# Patient Record
Sex: Female | Born: 1950 | Race: White | Hispanic: No | Smoking: Never smoker
Health system: Southern US, Community
[De-identification: ages and names within clinical notes are randomized; demographics above are authoritative.]

## PROBLEM LIST (undated history)

## (undated) DIAGNOSIS — D259 Leiomyoma of uterus, unspecified: Secondary | ICD-10-CM

## (undated) DIAGNOSIS — H269 Unspecified cataract: Secondary | ICD-10-CM

## (undated) DIAGNOSIS — M810 Age-related osteoporosis without current pathological fracture: Secondary | ICD-10-CM

## (undated) DIAGNOSIS — L570 Actinic keratosis: Secondary | ICD-10-CM

## (undated) DIAGNOSIS — F3289 Other specified depressive episodes: Secondary | ICD-10-CM

## (undated) DIAGNOSIS — J309 Allergic rhinitis, unspecified: Secondary | ICD-10-CM

## (undated) DIAGNOSIS — F419 Anxiety disorder, unspecified: Secondary | ICD-10-CM

## (undated) DIAGNOSIS — C49A0 Gastrointestinal stromal tumor, unspecified site: Secondary | ICD-10-CM

## (undated) DIAGNOSIS — D649 Anemia, unspecified: Secondary | ICD-10-CM

## (undated) DIAGNOSIS — G709 Myoneural disorder, unspecified: Secondary | ICD-10-CM

## (undated) DIAGNOSIS — E785 Hyperlipidemia, unspecified: Secondary | ICD-10-CM

## (undated) DIAGNOSIS — F329 Major depressive disorder, single episode, unspecified: Secondary | ICD-10-CM

## (undated) DIAGNOSIS — D869 Sarcoidosis, unspecified: Secondary | ICD-10-CM

## (undated) DIAGNOSIS — H3530 Unspecified macular degeneration: Secondary | ICD-10-CM

## (undated) HISTORY — DX: Myoneural disorder, unspecified: G70.9

## (undated) HISTORY — DX: Allergic rhinitis, unspecified: J30.9

## (undated) HISTORY — DX: Sarcoidosis, unspecified: D86.9

## (undated) HISTORY — DX: Other specified depressive episodes: F32.89

## (undated) HISTORY — DX: Unspecified cataract: H26.9

## (undated) HISTORY — PX: BREAST SURGERY: SHX581

## (undated) HISTORY — DX: Leiomyoma of uterus, unspecified: D25.9

## (undated) HISTORY — DX: Anemia, unspecified: D64.9

## (undated) HISTORY — PX: TUBAL LIGATION: SHX77

## (undated) HISTORY — DX: Major depressive disorder, single episode, unspecified: F32.9

## (undated) HISTORY — PX: UPPER GASTROINTESTINAL ENDOSCOPY: SHX188

## (undated) HISTORY — DX: Age-related osteoporosis without current pathological fracture: M81.0

## (undated) HISTORY — DX: Gastrointestinal stromal tumor, unspecified site: C49.A0

## (undated) HISTORY — DX: Anxiety disorder, unspecified: F41.9

## (undated) HISTORY — DX: Actinic keratosis: L57.0

## (undated) HISTORY — DX: Unspecified macular degeneration: H35.30

## (undated) HISTORY — DX: Hyperlipidemia, unspecified: E78.5

---
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
## 1975-11-02 HISTORY — PX: OTHER SURGICAL HISTORY: SHX169

## 2000-01-14 ENCOUNTER — Encounter: Payer: Self-pay | Admitting: Family Medicine

## 2000-01-14 ENCOUNTER — Ambulatory Visit (HOSPITAL_COMMUNITY): Admission: RE | Admit: 2000-01-14 | Discharge: 2000-01-14 | Payer: Self-pay | Admitting: Family Medicine

## 2000-11-01 DIAGNOSIS — M810 Age-related osteoporosis without current pathological fracture: Secondary | ICD-10-CM

## 2000-11-01 HISTORY — DX: Age-related osteoporosis without current pathological fracture: M81.0

## 2001-07-27 ENCOUNTER — Encounter: Payer: Self-pay | Admitting: Family Medicine

## 2001-07-27 ENCOUNTER — Encounter: Admission: RE | Admit: 2001-07-27 | Discharge: 2001-07-27 | Payer: Self-pay | Admitting: Family Medicine

## 2002-02-23 ENCOUNTER — Encounter: Payer: Self-pay | Admitting: Family Medicine

## 2002-02-23 ENCOUNTER — Ambulatory Visit (HOSPITAL_COMMUNITY): Admission: RE | Admit: 2002-02-23 | Discharge: 2002-02-23 | Payer: Self-pay | Admitting: Family Medicine

## 2002-03-01 ENCOUNTER — Encounter: Admission: RE | Admit: 2002-03-01 | Discharge: 2002-03-01 | Payer: Self-pay | Admitting: Family Medicine

## 2002-03-01 ENCOUNTER — Encounter: Payer: Self-pay | Admitting: Family Medicine

## 2003-04-22 ENCOUNTER — Ambulatory Visit (HOSPITAL_COMMUNITY): Admission: RE | Admit: 2003-04-22 | Discharge: 2003-04-22 | Payer: Self-pay | Admitting: Gastroenterology

## 2003-04-22 ENCOUNTER — Encounter (INDEPENDENT_AMBULATORY_CARE_PROVIDER_SITE_OTHER): Payer: Self-pay | Admitting: *Deleted

## 2004-05-06 ENCOUNTER — Other Ambulatory Visit: Admission: RE | Admit: 2004-05-06 | Discharge: 2004-05-06 | Payer: Self-pay | Admitting: Obstetrics and Gynecology

## 2005-08-13 ENCOUNTER — Ambulatory Visit (HOSPITAL_COMMUNITY): Admission: RE | Admit: 2005-08-13 | Discharge: 2005-08-13 | Payer: Self-pay | Admitting: Specialist

## 2005-08-26 ENCOUNTER — Other Ambulatory Visit: Admission: RE | Admit: 2005-08-26 | Discharge: 2005-08-26 | Payer: Self-pay | Admitting: Family Medicine

## 2007-04-10 ENCOUNTER — Encounter: Payer: Self-pay | Admitting: Internal Medicine

## 2007-04-10 LAB — CONVERTED CEMR LAB
HDL: 51 mg/dL
LDL (calc): 23 mg/dL
LDL Cholesterol: 100 mg/dL
Triglyceride fasting, serum: 113 mg/dL

## 2007-10-02 ENCOUNTER — Other Ambulatory Visit: Admission: RE | Admit: 2007-10-02 | Discharge: 2007-10-02 | Payer: Self-pay | Admitting: Family Medicine

## 2007-10-02 ENCOUNTER — Encounter: Payer: Self-pay | Admitting: Internal Medicine

## 2007-10-02 LAB — CONVERTED CEMR LAB: Pap Smear: NEGATIVE

## 2007-10-03 ENCOUNTER — Emergency Department (HOSPITAL_COMMUNITY): Admission: EM | Admit: 2007-10-03 | Discharge: 2007-10-03 | Payer: Self-pay | Admitting: Family Medicine

## 2007-11-02 DIAGNOSIS — C49A0 Gastrointestinal stromal tumor, unspecified site: Secondary | ICD-10-CM

## 2007-11-02 DIAGNOSIS — D869 Sarcoidosis, unspecified: Secondary | ICD-10-CM

## 2007-11-02 HISTORY — DX: Gastrointestinal stromal tumor, unspecified site: C49.A0

## 2007-11-02 HISTORY — DX: Sarcoidosis, unspecified: D86.9

## 2007-12-22 ENCOUNTER — Encounter: Payer: Self-pay | Admitting: Internal Medicine

## 2008-09-17 ENCOUNTER — Encounter: Payer: Self-pay | Admitting: Internal Medicine

## 2008-09-17 LAB — CONVERTED CEMR LAB
ALT: 18 units/L
AST: 25 units/L
CO2: 29 meq/L
Calcium: 9.8 mg/dL
Glucose, Bld: 91 mg/dL
HCT: 30.4 %
Hemoglobin: 10.4 g/dL
LDL Cholesterol: 114 mg/dL
MCV: 89.4 fL
Potassium: 4.4 meq/L
WBC: 4.6 10*3/uL

## 2008-09-18 ENCOUNTER — Encounter: Payer: Self-pay | Admitting: Gastroenterology

## 2008-09-19 ENCOUNTER — Encounter: Payer: Self-pay | Admitting: Gastroenterology

## 2008-10-01 ENCOUNTER — Telehealth: Payer: Self-pay | Admitting: Gastroenterology

## 2008-10-01 ENCOUNTER — Encounter: Payer: Self-pay | Admitting: Gastroenterology

## 2008-10-03 ENCOUNTER — Ambulatory Visit (HOSPITAL_COMMUNITY): Admission: RE | Admit: 2008-10-03 | Discharge: 2008-10-03 | Payer: Self-pay | Admitting: Gastroenterology

## 2008-10-03 ENCOUNTER — Ambulatory Visit: Payer: Self-pay | Admitting: Gastroenterology

## 2008-10-03 ENCOUNTER — Encounter: Payer: Self-pay | Admitting: Gastroenterology

## 2008-10-04 ENCOUNTER — Ambulatory Visit: Payer: Self-pay | Admitting: Internal Medicine

## 2008-10-04 ENCOUNTER — Ambulatory Visit: Payer: Self-pay | Admitting: Gastroenterology

## 2008-10-08 ENCOUNTER — Encounter: Payer: Self-pay | Admitting: Gastroenterology

## 2008-10-08 ENCOUNTER — Telehealth: Payer: Self-pay | Admitting: Gastroenterology

## 2008-10-09 ENCOUNTER — Encounter: Payer: Self-pay | Admitting: Gastroenterology

## 2008-10-14 ENCOUNTER — Encounter: Payer: Self-pay | Admitting: Gastroenterology

## 2008-10-16 ENCOUNTER — Ambulatory Visit: Payer: Self-pay | Admitting: Pulmonary Disease

## 2008-10-16 ENCOUNTER — Telehealth: Payer: Self-pay | Admitting: Pulmonary Disease

## 2008-10-16 DIAGNOSIS — E785 Hyperlipidemia, unspecified: Secondary | ICD-10-CM | POA: Insufficient documentation

## 2008-10-16 DIAGNOSIS — R599 Enlarged lymph nodes, unspecified: Secondary | ICD-10-CM | POA: Insufficient documentation

## 2008-10-17 ENCOUNTER — Encounter: Payer: Self-pay | Admitting: Gastroenterology

## 2008-10-17 ENCOUNTER — Telehealth: Payer: Self-pay | Admitting: Gastroenterology

## 2008-10-18 ENCOUNTER — Ambulatory Visit (HOSPITAL_COMMUNITY): Admission: RE | Admit: 2008-10-18 | Discharge: 2008-10-18 | Payer: Self-pay | Admitting: Gastroenterology

## 2008-10-18 ENCOUNTER — Encounter: Payer: Self-pay | Admitting: Gastroenterology

## 2008-10-22 ENCOUNTER — Telehealth: Payer: Self-pay | Admitting: Gastroenterology

## 2008-10-28 ENCOUNTER — Telehealth (INDEPENDENT_AMBULATORY_CARE_PROVIDER_SITE_OTHER): Payer: Self-pay | Admitting: *Deleted

## 2008-10-28 ENCOUNTER — Ambulatory Visit: Payer: Self-pay | Admitting: Pulmonary Disease

## 2008-11-01 HISTORY — PX: STOMACH SURGERY: SHX791

## 2008-11-28 ENCOUNTER — Encounter: Payer: Self-pay | Admitting: Internal Medicine

## 2008-11-28 ENCOUNTER — Encounter (INDEPENDENT_AMBULATORY_CARE_PROVIDER_SITE_OTHER): Payer: Self-pay | Admitting: General Surgery

## 2008-11-28 ENCOUNTER — Inpatient Hospital Stay (HOSPITAL_COMMUNITY): Admission: RE | Admit: 2008-11-28 | Discharge: 2008-12-03 | Payer: Self-pay | Admitting: General Surgery

## 2008-12-09 ENCOUNTER — Ambulatory Visit: Payer: Self-pay | Admitting: Oncology

## 2008-12-09 ENCOUNTER — Encounter: Payer: Self-pay | Admitting: Gastroenterology

## 2008-12-17 ENCOUNTER — Encounter: Payer: Self-pay | Admitting: Gastroenterology

## 2009-01-13 ENCOUNTER — Encounter: Payer: Self-pay | Admitting: Internal Medicine

## 2009-04-23 ENCOUNTER — Encounter: Payer: Self-pay | Admitting: Pulmonary Disease

## 2009-04-25 ENCOUNTER — Ambulatory Visit: Payer: Self-pay | Admitting: Pulmonary Disease

## 2009-04-25 LAB — CONVERTED CEMR LAB
CO2: 32 meq/L (ref 19–32)
Calcium: 9.7 mg/dL (ref 8.4–10.5)
Creatinine, Ser: 0.7 mg/dL (ref 0.4–1.2)

## 2009-04-29 ENCOUNTER — Ambulatory Visit: Payer: Self-pay | Admitting: Internal Medicine

## 2009-06-30 ENCOUNTER — Encounter: Payer: Self-pay | Admitting: Gastroenterology

## 2009-07-29 ENCOUNTER — Emergency Department (HOSPITAL_COMMUNITY): Admission: EM | Admit: 2009-07-29 | Discharge: 2009-07-29 | Payer: Self-pay | Admitting: Family Medicine

## 2009-09-10 ENCOUNTER — Ambulatory Visit: Payer: Self-pay | Admitting: Internal Medicine

## 2009-09-10 DIAGNOSIS — J309 Allergic rhinitis, unspecified: Secondary | ICD-10-CM | POA: Insufficient documentation

## 2009-09-10 DIAGNOSIS — F329 Major depressive disorder, single episode, unspecified: Secondary | ICD-10-CM

## 2009-09-10 DIAGNOSIS — D649 Anemia, unspecified: Secondary | ICD-10-CM

## 2009-09-10 DIAGNOSIS — D259 Leiomyoma of uterus, unspecified: Secondary | ICD-10-CM

## 2009-09-22 ENCOUNTER — Encounter: Payer: Self-pay | Admitting: Internal Medicine

## 2009-09-22 DIAGNOSIS — M899 Disorder of bone, unspecified: Secondary | ICD-10-CM | POA: Insufficient documentation

## 2009-09-22 DIAGNOSIS — M810 Age-related osteoporosis without current pathological fracture: Secondary | ICD-10-CM

## 2009-09-22 DIAGNOSIS — M949 Disorder of cartilage, unspecified: Secondary | ICD-10-CM

## 2009-10-11 IMAGING — CT CT CHEST W/ CM
2 of 5 series · 15 of 36 positions shown, 18 images · IV contrast (agent unspecified)
Comparison: None.

CT CHEST

CLINICAL DATA: Gastric mass.  Mediastinal adenopathy.

CT CHEST AND ABDOMEN WITH CONTRAST
TECHNIQUE: Multidetector CT imaging of the chest and abdomen was
performed following the standard protocol during bolus
administration of intravenous contrast.
Contrast: 125 ml Smnipaque-ZII

[Series 2: cap 5.0 b40f st · axial · 0.66mm/px · z∈[-448,-74]mm · 12 of 89 slices shown, 15 images]
[im 7/89  mediastinal]
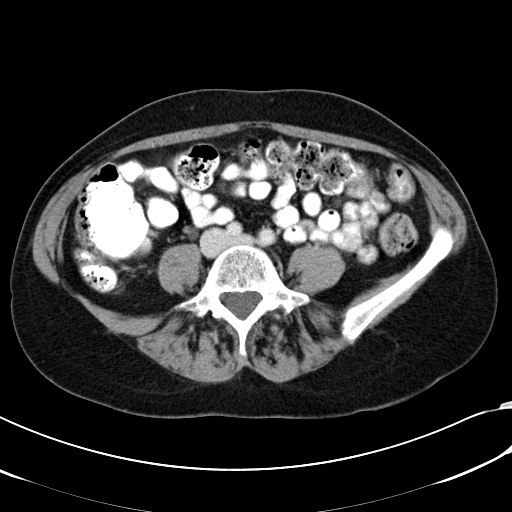
[im 7/89  lung]
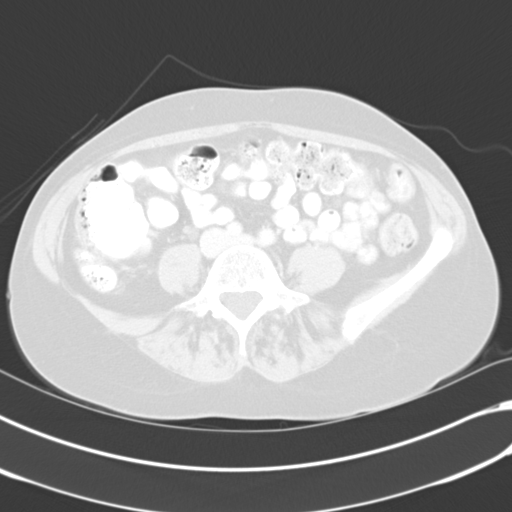
[im 14/89  lung]
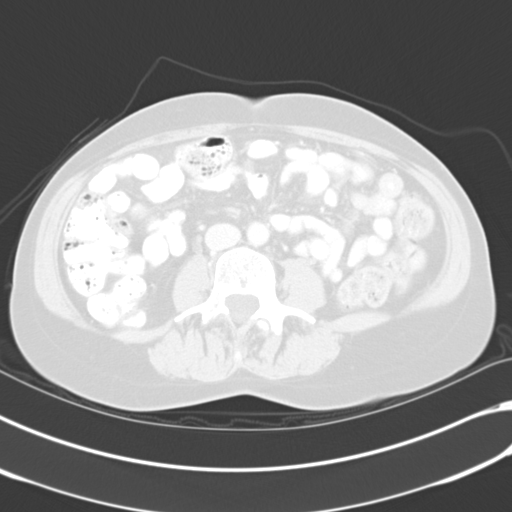
[im 21/89  lung]
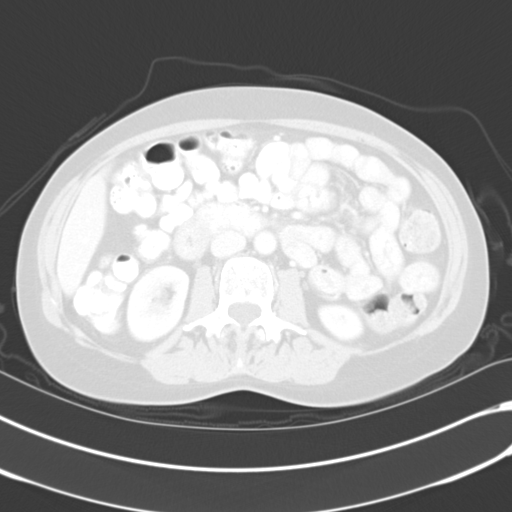
[im 28/89  lung]
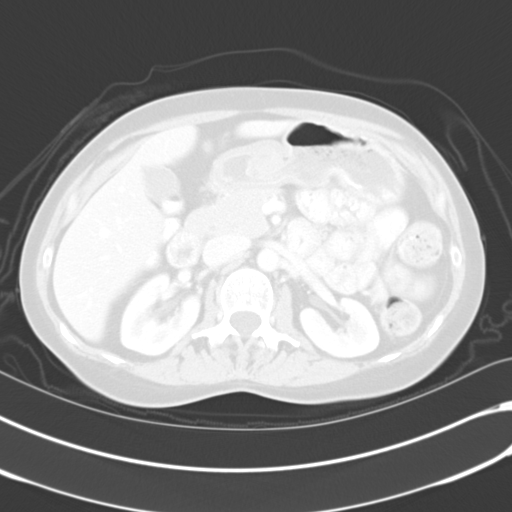
[im 34/89  mediastinal]
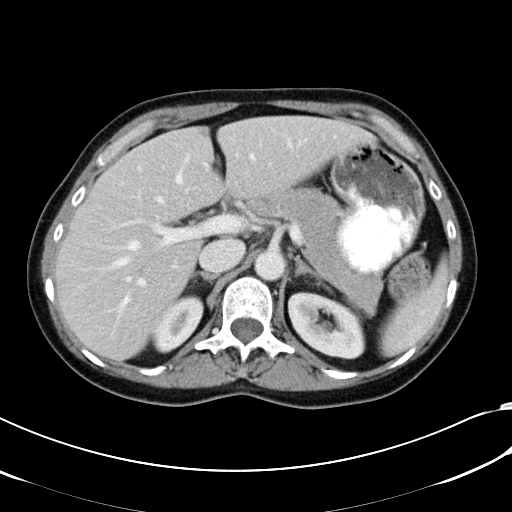
[im 34/89  lung]
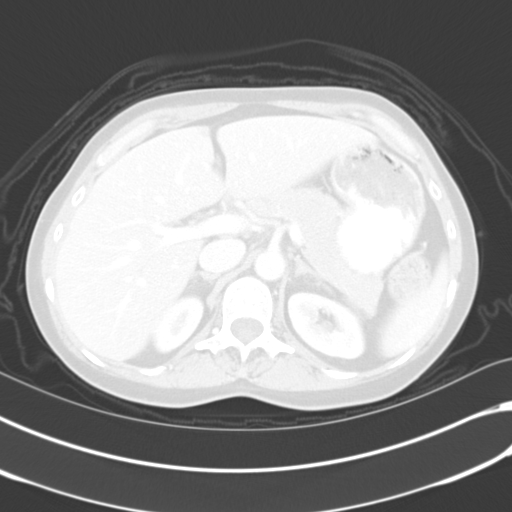
[im 41/89  lung]
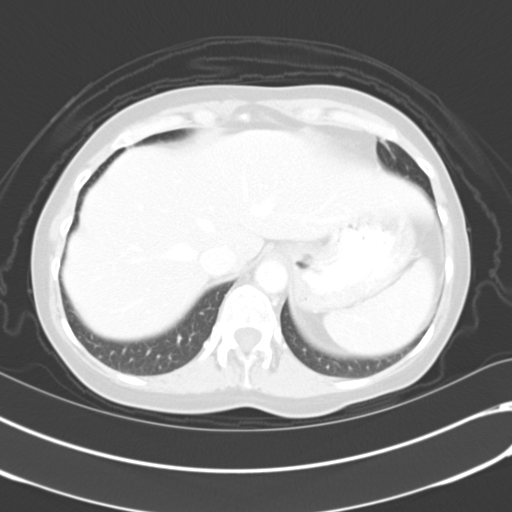
[im 48/89  lung]
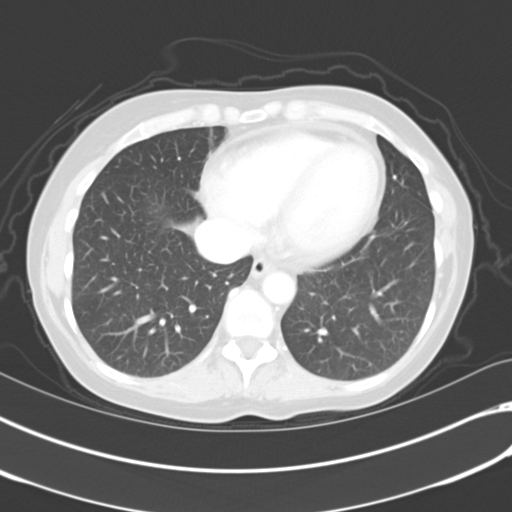
[im 55/89  lung]
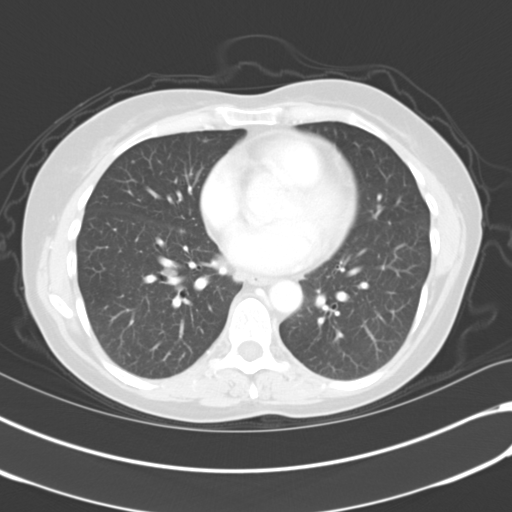
[im 61/89  mediastinal]
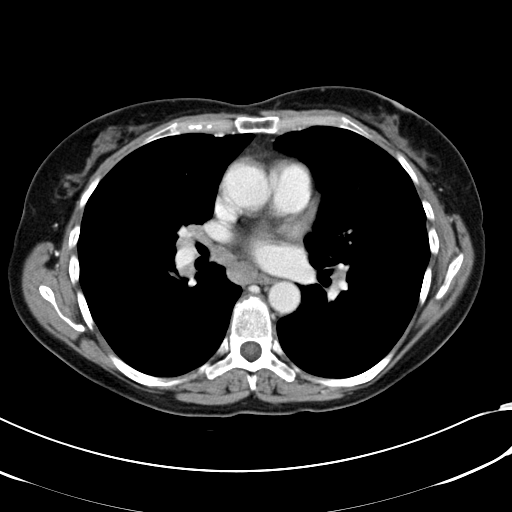
[im 61/89  lung]
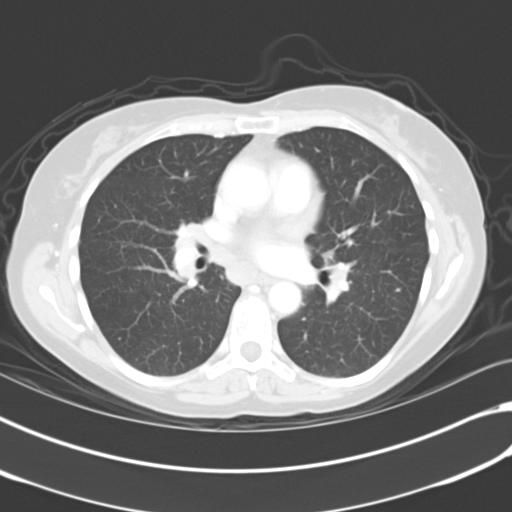
[im 68/89  lung]
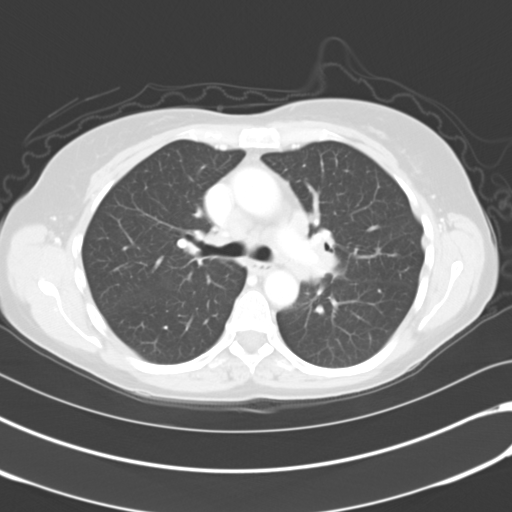
[im 75/89  lung]
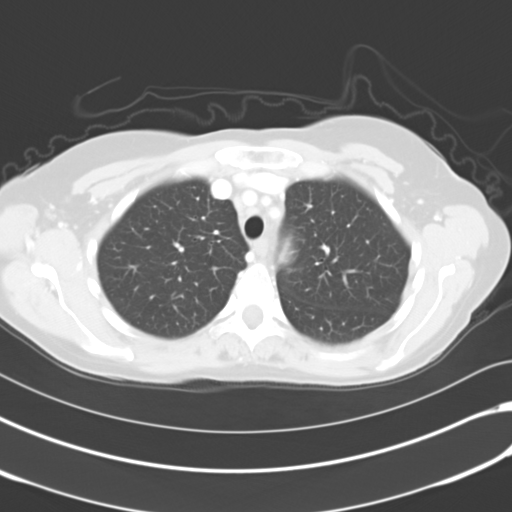
[im 82/89  lung]
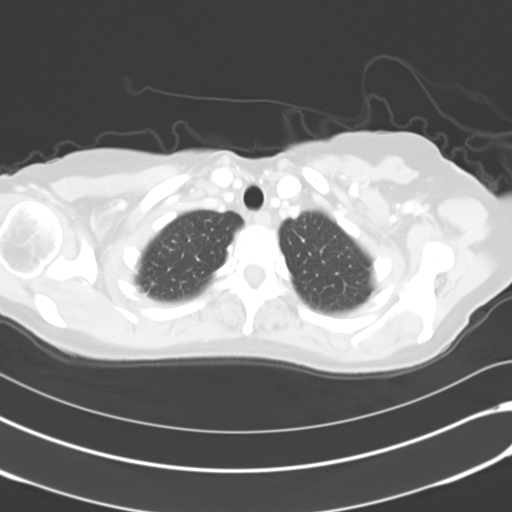

[Series 602: <mpr thick range> · coronal · 0.91mm/px · 3 of 67 slices shown]
[im 14/67  lung]
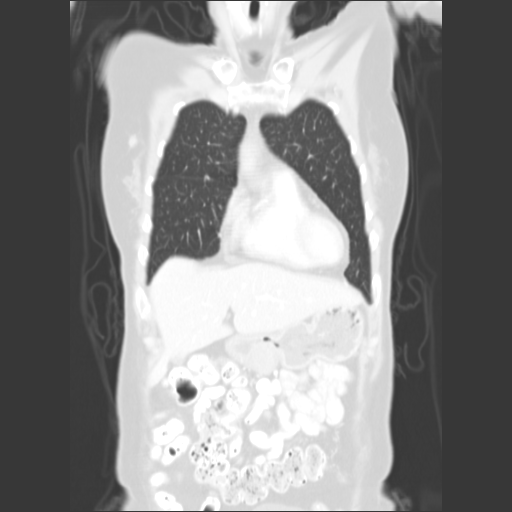
[im 27/67  lung]
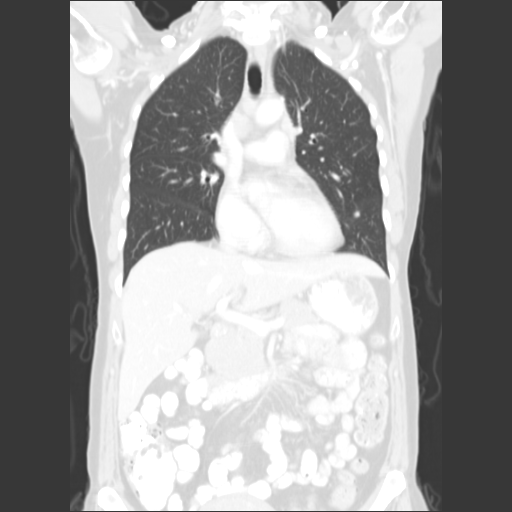
[im 40/67  lung]
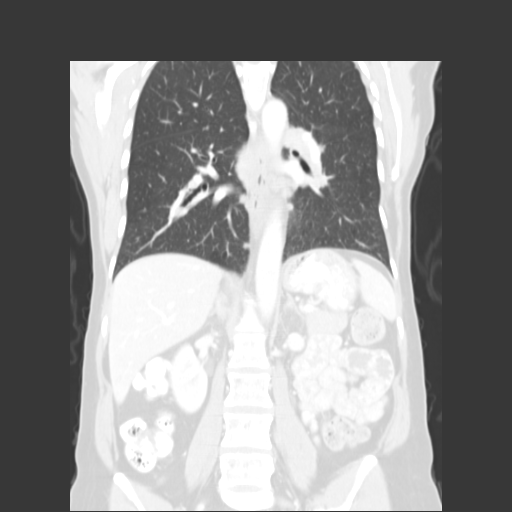

[15 of 36 positions shown; findings below may reference images not displayed]

FINDINGS: There is no axillary lymphadenopathy.  Small lymph nodes
are seen in the right supraclavicular region.  Mediastinal
lymphadenopathy is noted.  A 11 mm short-axis right paratracheal
lymph node is associated with a 14 mm right hilar lymph node.
There is a AP window lymphadenopathy measuring up to 11 mm in short
axis.  Left hilar lymphadenopathy and subcarinal lymphadenopathy is
associated.  An index node in the subcarinal station measures 15 mm
short axis.

The heart size is normal.  There is trace anterior pericardial
thickening or fluid.  No evidence for pleural effusion.  Small
lymph nodes are noted adjacent to the distal esophagus.

Lung windows reveal biapical interstitial scarring.  5 mm pulmonary
nodule is seen adjacent to the right hilum on image 30.  A 6 mm
central right middle lobe pulmonary nodule is seen on image 34.  5
mm subpleural nodule is seen in the as ago esophageal recess on
image 43.  4 mm left lower lobe nodules seen along the major
fissure on image 38.  6 mm subpleural left lower lobe pulmonary
nodule seen on image 25.

Bone windows revealed no worrisome lytic or sclerotic osseous
lesions.
IMPRESSION: Mediastinal and bilateral hilar lymphadenopathy.  The degree of
adenopathy is more advanced than typically seen for infectious
process and metastatic disease is a distinct concern.

Scattered small bilateral parenchymal lung nodules.  Metastatic
involvement could have this appearance.

CT ABDOMEN
FINDINGS: No focal abnormalities seen in the liver or spleen.  A
2.6 x 3.2 cm soft tissue mass seen in the wall of the gastric
antrum, consistent with the reported history.  The duodenum,
pancreas, gallbladder, adrenal glands, and abdominal bowel loops
are unremarkable. No evidence for abdominal lymphadenopathy.  There
is no intraperitoneal free fluid.

Bone windows reveal no worrisome lytic nor sclerotic osseous
lesions.
IMPRESSION: 3 cm mass in the distal stomach, consistent with the reported
history of mass seen on endoscopy.

## 2009-11-01 HISTORY — PX: COLONOSCOPY: SHX174

## 2009-12-11 ENCOUNTER — Ambulatory Visit: Payer: Self-pay | Admitting: Internal Medicine

## 2009-12-11 DIAGNOSIS — H6120 Impacted cerumen, unspecified ear: Secondary | ICD-10-CM | POA: Insufficient documentation

## 2010-01-14 ENCOUNTER — Encounter (INDEPENDENT_AMBULATORY_CARE_PROVIDER_SITE_OTHER): Payer: Self-pay | Admitting: *Deleted

## 2010-02-02 ENCOUNTER — Telehealth (INDEPENDENT_AMBULATORY_CARE_PROVIDER_SITE_OTHER): Payer: Self-pay | Admitting: *Deleted

## 2010-02-06 ENCOUNTER — Ambulatory Visit: Payer: Self-pay | Admitting: Internal Medicine

## 2010-02-13 ENCOUNTER — Encounter (INDEPENDENT_AMBULATORY_CARE_PROVIDER_SITE_OTHER): Payer: Self-pay | Admitting: *Deleted

## 2010-02-13 ENCOUNTER — Ambulatory Visit: Payer: Self-pay | Admitting: Gastroenterology

## 2010-02-16 LAB — CONVERTED CEMR LAB
Cholesterol: 171 mg/dL (ref 0–200)
VLDL: 30.4 mg/dL (ref 0.0–40.0)

## 2010-03-12 ENCOUNTER — Ambulatory Visit: Payer: Self-pay | Admitting: Gastroenterology

## 2010-03-12 ENCOUNTER — Ambulatory Visit (HOSPITAL_COMMUNITY): Admission: RE | Admit: 2010-03-12 | Discharge: 2010-03-12 | Payer: Self-pay | Admitting: Gastroenterology

## 2010-03-12 LAB — HM COLONOSCOPY

## 2010-09-28 ENCOUNTER — Encounter: Payer: Self-pay | Admitting: Internal Medicine

## 2010-09-28 LAB — HM MAMMOGRAPHY

## 2010-11-01 DIAGNOSIS — H3530 Unspecified macular degeneration: Secondary | ICD-10-CM

## 2010-11-01 HISTORY — DX: Unspecified macular degeneration: H35.30

## 2010-11-04 ENCOUNTER — Telehealth: Payer: Self-pay | Admitting: Internal Medicine

## 2010-11-25 ENCOUNTER — Ambulatory Visit
Admission: RE | Admit: 2010-11-25 | Discharge: 2010-11-25 | Payer: Self-pay | Source: Home / Self Care | Attending: Internal Medicine | Admitting: Internal Medicine

## 2010-11-25 ENCOUNTER — Other Ambulatory Visit: Payer: Self-pay | Admitting: Internal Medicine

## 2010-11-25 LAB — CBC WITH DIFFERENTIAL/PLATELET
Basophils Absolute: 0.1 10*3/uL (ref 0.0–0.1)
HCT: 46.5 % — ABNORMAL HIGH (ref 36.0–46.0)
Lymphs Abs: 2.1 10*3/uL (ref 0.7–4.0)
MCV: 92.9 fl (ref 78.0–100.0)
Monocytes Absolute: 0.4 10*3/uL (ref 0.1–1.0)
Monocytes Relative: 7 % (ref 3.0–12.0)
Platelets: 237 10*3/uL (ref 150.0–400.0)
RDW: 13.7 % (ref 11.5–14.6)

## 2010-11-25 LAB — LIPID PANEL
Cholesterol: 187 mg/dL (ref 0–200)
HDL: 40.6 mg/dL (ref >39.00)
VLDL: 22.8 mg/dL (ref 0.0–40.0)

## 2010-11-25 LAB — URINALYSIS
Bilirubin Urine: NEGATIVE
Hemoglobin, Urine: NEGATIVE
Ketones, ur: NEGATIVE
Total Protein, Urine: NEGATIVE
Urine Glucose: NEGATIVE

## 2010-11-25 LAB — BASIC METABOLIC PANEL
BUN: 12 mg/dL (ref 6–23)
Creatinine, Ser: 0.9 mg/dL (ref 0.4–1.2)
GFR: 64.67 mL/min (ref >60.00)
Glucose, Bld: 94 mg/dL (ref 70–99)
Potassium: 5.1 mEq/L (ref 3.5–5.1)

## 2010-11-25 LAB — HEPATIC FUNCTION PANEL: Total Bilirubin: 1 mg/dL (ref 0.3–1.2)

## 2010-11-25 LAB — TSH: TSH: 1.64 u[IU]/mL (ref 0.35–5.50)

## 2010-12-01 ENCOUNTER — Encounter: Payer: Self-pay | Admitting: Internal Medicine

## 2010-12-01 ENCOUNTER — Ambulatory Visit
Admission: RE | Admit: 2010-12-01 | Discharge: 2010-12-01 | Payer: Self-pay | Source: Home / Self Care | Attending: Internal Medicine | Admitting: Internal Medicine

## 2010-12-01 NOTE — Letter (Signed)
Summary: [facility] Inst[facility]  [facility]  [address] [place], [place] [address]   Phone: [phone]  Fax: [phone]       [name]    September 11, 1951    MRN: [id number]        Procedure Day /Date:03/12/10     Arrival Time:1130 am     Procedure Time:1230 pm     Location of Procedure:                     X  [facility] ( Outpatient Registration)                        PREPARATION FOR COLONOSCOPY WITH MOVIPREP   Starting 5 days prior to your procedure 03/07/10 do not eat nuts, seeds, popcorn, corn, beans, peas,  salads, or any raw vegetables.  Do not take any fiber supplements (e.g. Metamucil, Citrucel, and Benefiber).  THE DAY BEFORE YOUR PROCEDURE         DATE:03/11/10 DAY: WED  1.  Drink clear liquids the entire day-NO SOLID FOOD  2.  Do not drink anything colored red or purple.  Avoid juices with pulp.  No orange juice.  3.  Drink at least 64 oz. (8 glasses) of fluid/clear liquids during the day to prevent dehydration and help the prep work efficiently.  CLEAR LIQUIDS INCLUDE: Water Jello Ice Popsicles Tea (sugar ok, no milk/cream) Powdered fruit flavored drinks Coffee (sugar ok, no milk/cream) Gatorade Juice: apple, white grape, white cranberry  Lemonade Clear bullion, consomm, broth Carbonated beverages (any kind) Strained chicken noodle soup Hard Candy                             4.  In the morning, mix first dose of MoviPrep solution:    Empty 1 Pouch A and 1 Pouch B into the disposable container    Add lukewarm drinking water to the top line of the container. Mix to dissolve    Refrigerate (mixed solution should be used within 24 hrs)  5.  Begin drinking the prep at 5:00 p.m. The MoviPrep container is divided by 4 marks.   Every 15 minutes drink the solution down to the next mark (approximately 8 oz) until the full liter is complete.   6.  Follow completed prep with 16 oz of clear liquid of your choice (Nothing red or  purple).  Continue to drink clear liquids until bedtime.  7.  Before going to bed, mix second dose of MoviPrep solution:    Empty 1 Pouch A and 1 Pouch B into the disposable container    Add lukewarm drinking water to the top line of the container. Mix to dissolve    Refrigerate  THE DAY OF YOUR PROCEDURE      DATE:03/12/10  DAY: THURS  Beginning at 7 a.m. (5 hours before procedure):         1. Every 15 minutes, drink the solution down to the next mark (approx 8 oz) until the full liter is complete.  2. Follow completed prep with 16 oz. of clear liquid of your choice.    3. You may drink clear liquids until 830 am (4 HOURS BEFORE PROCEDURE).   MEDICATION INSTRUCTIONS  Unless otherwise instructed, you should take regular prescription medications with a small sip of water   as early as possible the morning of your procedure.  OTHER INSTRUCTIONS  You will need a responsible adult at least 60 years of age to accompany you and drive you home.   This person must remain in the waiting room during your procedure.  Wear loose fitting clothing that is easily removed.  Leave jewelry and other valuables at home.  However, you may wish to bring a book to read or  an iPod/MP3 player to listen to music as you wait for your procedure to start.  Remove all body piercing jewelry and leave at home.  Total time from sign-in until discharge is approximately 2-3 hours.  You should go home directly after your procedure and rest.  You can resume normal activities the  day after your procedure.  The day of your procedure you should not:   Drive   Make legal decisions   Operate machinery   Drink alcohol   Return to work  You will receive specific instructions about eating, activities and medications before you leave.    The above instructions have been reviewed and explained to me by   _______________________    I fully understand and can verbalize these instructions  _____________________________ Date _________

## 2010-12-01 NOTE — Assessment & Plan Note (Signed)
Summary: needs to have ears irrigated/ #/cd   Vital Signs:  Patient profile:   60 year old female Height:      64 inches (162.56 cm) Weight:      134.0 pounds (60.91 kg) O2 Sat:      97 % on Room air Temp:     97.9 degrees F (36.61 degrees C) oral Pulse rate:   88 / minute BP sitting:   100 / 72  (left arm) Cuff size:   regular  Vitals Entered By: Orlan Leavens (December 11, 2009 11:00 AM)  O2 Flow:  Room air CC: F/U ON EAR MIGHT NEED IRRAGATION Is Patient Diabetic? No Pain Assessment Patient in pain? no        Primary Care Provider:  Newt Lukes MD  CC:  F/U ON EAR MIGHT NEED IRRAGATION.  History of Present Illness: here for ear irrigation - told by [facility] care approx 1 mo ago needed to have her ears irrigated - mild fullness and pressure c/o on right>left side denies ear pain or change in hearing no drainage from ears  Current Medications (verified): 1)  Simvastatin 20 Mg Tabs (Simvastatin) .... Take 1 Tablet By Mouth Once A Day 2)  Coq10 100 Mg Caps (Coenzyme Q10) .... Take 1 By Mouth Qd 3)  Calcium-Magnesium 500-250 Mg Tabs (Calcium-Magnesium) .... Take 1 By Mouth Qd 4)  Multivitamins  Tabs (Multiple Vitamin) .... Take 1 By Mouth Qd 5)  Atelvia 35mg  .... Take 1 Q Week 6)  Vitamin D3 1000 Unit Caps (Cholecalciferol) .... Once Daily 7)  Fish Oil 1000 Mg Caps (Omega-3 Fatty Acids) .... Qd  Allergies (verified): 1)  ! Codeine 2)  ! Vicodin 3)  ! 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    Physical Exam  General:  alert, well-developed, well-nourished, and cooperative to examination.    Ears:  occlusive dark cerumen R>L ear - after irrigation,  TMs clear without  effusion, erythema or cerumen impaction. Hearing grossly normal bilaterally  Mouth:  teeth and gums in good repair; mucous membranes moist, without lesions or ulcers. oropharynx clear without exudate, erythema.  Lungs:  normal respiratory effort, no intercostal retractions or use of accessory muscles; normal breath sounds bilaterally - no crackles and no wheezes.    Heart:  normal rate, regular rhythm, no murmur, and no rub. BLE without edema. Additional Exam:  Procedure: ear irrigation, bilateral Reason: wax impaction R>L Risk/Benefit were discussed with patient who agrees to proceed. ears were irrigated with warm water. Large amount of wax was removed. Instrumentation with metal ear loop was performed to accomplish the wax removal. Patient tolerated procedure well. Complications: none    Impression & Recommendations:  Problem # 1:  CERUMEN IMPACTION, BILATERAL (ICD-380.4)  Orders: Cerumen Impaction Removal ([address])  Complete Medication List: 1)  Simvastatin 20 Mg Tabs (Simvastatin) .... Take 1 tablet by mouth once a day 2)  Coq10 100 Mg Caps (Coenzyme q10) .... Take 1 by mouth qd 3)  Calcium-magnesium 500-250 Mg Tabs (Calcium-magnesium) .... Take 1 by mouth qd 4)  Multivitamins Tabs (Multiple vitamin) .... Take 1 by mouth qd 5)  Atelvia 35mg   .... Take 1 q week 6)  Vitamin D3 1000 Unit Caps (Cholecalciferol) .... Once daily 7)  Fish  Oil 1000 Mg Caps (Omega-3 fatty acids) .... Qd  Patient Instructions: 1)  it was good to see you today.  2)  both ears successfully irrigated today 3)  may use OTC ear drops for wax to minimize reaccumulation -

## 2010-12-01 NOTE — Procedures (Signed)
Summary: Colonoscopy  Patient: [name] Note: All result statuses are Final unless otherwise noted.  Tests: (1) Colonoscopy (COL)   COL Colonoscopy           DONE     [facility]     [address] [place], [place]  [address]           COLONOSCOPY PROCEDURE REPORT           PATIENT:  [name], [name]  MR#:  [id number]     BIRTHDATE:  [birth year], 58 yrs. old  GENDER:  female     ENDOSCOPIST:  Rachael Fee, MD     PROCEDURE DATE:  03/12/2010     PROCEDURE:  Diagnostic Colonoscopy     ASA CLASS:  Class II     INDICATIONS:  Elevated Risk Screening, mother had colon cancer     MEDICATIONS:   Fentanyl 100 mcg IV, Versed 7 mg IV           DESCRIPTION OF PROCEDURE:   After the risks benefits and     alternatives of the procedure were thoroughly explained, informed     consent was obtained.  Digital rectal exam was performed and     revealed no rectal masses.   The  endoscope was introduced through     the anus and advanced to the cecum, which was identified by both     the appendix and ileocecal valve, without limitations.  The     quality of the prep was excellent, using MoviPrep.  The instrument     was then slowly withdrawn as the colon was fully examined.     <<PROCEDUREIMAGES>>           FINDINGS:  A normal appearing cecum, ileocecal valve, and     appendiceal orifice were identified. The ascending, hepatic     flexure, transverse, splenic flexure, descending, sigmoid colon,     and rectum appeared unremarkable (see image5, image6, and image7).     Retroflexed views in the rectum revealed no abnormalities.    The     scope was then withdrawn from the patient and the procedure     completed.           COMPLICATIONS:  None     ENDOSCOPIC IMPRESSION:     1) Normal colon     2) No polyps or cancers           RECOMMENDATIONS:     1) Given your significant family history of colon cancer, you     should have a repeat colonoscopy in 5 years           REPEAT  EXAM:  5 years           ______________________________     Rachael Fee, MD           n.     eSIGNED:   Rachael Fee at 03/12/2010 12:57 PM           Etheleen Mayhew, [id number]  Note: An exclamation mark (!) indicates a result that was not dispersed into the flowsheet. Document Creation Date: 03/12/2010 12:58 PM _______________________________________________________________________  (1) Order result status: Final Collection or observation date-time: 03/12/2010 12:54 Requested date-time:  Receipt date-time:  Reported date-time:  Referring Physician:   Ordering Physician: Rob Bunting [phone]) Specimen Source:  Source: Launa Grill Order Number: [id number] Lab site:   Appended Document: Colonoscopy patty she needs recall colonoscopy in  5 years and recall EGD in 1 year, thanks  Appended Document: Colonoscopy    Clinical Lists Changes  Observations: Added new observation of EGD DUE: 03/2011 (03/12/2010 13:58) Added new observation of COLONNXTDUE: 03/2015 (03/12/2010 13:58)      Appended Document: Colonoscopy recall in IDX and EMR

## 2010-12-01 NOTE — Procedures (Signed)
Summary: Colon   Colonoscopy  Procedure date:  04/22/2003  Findings:      Location:  [facility].     NAME:  [name], [name]                        ACCOUNT NO.:  [id number]   MEDICAL RECORD NO.:  [id number]                   PATIENT TYPE:  AMB   LOCATION:  ENDO                                 FACILITY:  [facility]   PHYSICIAN:  Anselmo Rod, M.D.               DATE OF BIRTH:  11/02/1950   DATE OF PROCEDURE:  04/22/2003  DATE OF DISCHARGE:                                 OPERATIVE REPORT   PROCEDURE PERFORMED:  Colonoscopy.   ENDOSCOPIST:  Charna Elizabeth, M.D.   INSTRUMENT USED:  Olympus video colonoscope.   INDICATIONS FOR PROCEDURE:  Change in bowel habits and family history of  colon cancer in a 60 year old white female.  Rule out colonic polyps,  masses, etc.   PREPROCEDURE PREPARATION:  Informed consent was procured from the patient.  The patient was fasted for eight hours prior to the procedure and prepped  with a bottle of magnesium citrate and a gallon of GoLYTELY the night prior  to the procedure.   PREPROCEDURE PHYSICAL:  The patient had stable vital signs.  Neck supple.  Chest clear to auscultation.  S1 and S2 regular.  Abdomen soft with normal  bowel sounds.   DESCRIPTION OF PROCEDURE:  The patient was placed in left lateral decubitus  position and sedated with 100 mg of Demerol and 7.5 mg of Versed  intravenously.  Once the patient was adequately sedated and maintained on  low flow oxygen and continuous cardiac monitoring, the Olympus video  colonoscope was advanced from the rectum to the cecum with difficulty.  The  initial scope used was the pediatric adjustable scope.  This could not be  advanced beyond the splenic flexure and therefore the scope was withdrawn  and the adult scope used instead.  The patient's position was changed from  the left lateral to the supine and right lateral position on several  occasions to reach the cecal base.  The  appendicular orifice and ileocecal  valve were visualized and photographed.  No masses, polyps, erosions,  ulcerations or diverticula were seen.  There was a significant amount of  residual stool in the colon, multiple washes were done.  The patient  tolerated the procedure well without complications.  Retroflexion in the  rectum revealed no abnormalities as well.   IMPRESSION:  1. Healthy-appearing colonic mucosa.  2. Very tortuous colon requiring changing of the scope from a pediatric     adjustable colonoscope to an adult scope.  3. No masses, polyps, or diverticulosis noted.   RECOMMENDATIONS:  1. A high fiber diet with liberal fluid intake has been advocated.  2. Repeat colorectal cancer screening is recommended in the next 5 years     unless the patient develops any abnormal symptoms in the interim.  3. Outpatient follow-up  in the next two weeks for further recommendations.                                                   Anselmo Rod, M.D.    JNM/MEDQ  D:  04/22/2003  T:  04/23/2003  Job:  [id number]   cc:   Tammy R. Collins [place], M.D.  [address]  [place]  [place] [address]  Fax: [phone]

## 2010-12-01 NOTE — Assessment & Plan Note (Signed)
Review of gastrointestinal problems: 1. 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  Did well post operatively.  Tumor originially noted by Dr. Evette Cristal.   2. Colonoscopy Dr. Charna Elizabeth (gave her "all clear, thumbs up sign after procedure", pt not aware of any significant findings).    History of Present Illness Visit Type: Initial Consult Primary GI MD: Rob Bunting MD Primary Provider: Newt Lukes MD Requesting Provider: Emelia Loron, MD Chief Complaint: thickening around antrum  History of Present Illness:     very pleasant 60 year old woman who was referred to me by an outside gastroenterologist a little over a year ago for a submucosal gastric lesion that had bled overtly. This ended up being a GIST tumor, and she underwent resection in early 2011. During the evaluation of that lesion I found mediastinal adenopathy, eventually she underwent endoscopic ultrasound biopsies of the fact process and those lymph nodes are probably resulting from sarcoidosis. She is being followed by pulmonologist, Dr. Marcelyn Bruins.    she has been eating well, no bowel trouble.  She has probably gained 5 pounds in the past year or so.  Dr. Shelle Iron feels she has sarcoidosis based on mediastinal adenpathy, FNA by me (EUS).  her mother was recently diagnosed with colon cancer. She herself had a colonoscopy in 2004, those results are available in the hospital T. chart system and and this electronic medical record. She had no polyps but did have a redundant colon.  she had a CT scan done last summer suggesting thickening of her distal stomach around the perioperative site.           Current Medications (verified): 1)  Simvastatin 20 Mg Tabs (Simvastatin) .... Take 1 Tablet By Mouth Once A Day 2)  Coq10 100 Mg Caps (Coenzyme Q10) .... Take 1 By Mouth Qd 3)  Calcium-Magnesium 500-250 Mg Tabs (Calcium-Magnesium) .... Take 1 By Mouth Qd 4)  Multivitamins  Tabs (Multiple Vitamin) .... Take 1 By Mouth  Qd 5)  Vitamin D3 1000 Unit Caps (Cholecalciferol) .... Once Daily 6)  Fish Oil 1000 Mg Caps (Omega-3 Fatty Acids) .... Qd 7)  Fosamax 70 Mg Tabs (Alendronate Sodium) .... Once Weekly  Allergies (verified): 1)  ! Codeine 2)  ! Vicodin 3)  ! 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 Allergic rhinitis Anemia-NOS Depression osteoporosis - 2002   MD rooster - psyc - [facility] -[facility]  Past Surgical History: history of tubal ligation gastric GIST removed 2009 Child birth 1977 Breast biopsy 1995 Tonsillectomy 1957   Family History: allergies: mother, sister,daughter cancer:uncle (prostate), aunt (breast)  mother diagnosed with colon cancer  Social History: Patient never smoked.  social alcohol use pt is divorced, lives alone pt has 1 dtr pt is a retired Comptroller   Review of Systems       Pertinent positive and negative review of systems were noted in the above HPI and GI specific review of systems.  All other review of systems was otherwise negative.   Vital Signs:  Patient profile:   60 year old female Height:      64 inches Weight:      137 pounds BMI:     23.60 Pulse rate:   76 / minute Pulse rhythm:   regular BP sitting:   98 / 62  (left arm)  Vitals Entered By: Milford Cage NCMA (February 13, 2010 9:13 AM)  Physical Exam  Additional Exam:  Constitutional: generally well appearing Psychiatric: alert and oriented times 3 Eyes: extraocular movements intact Mouth: oropharynx moist, no lesions Neck: supple, no lymphadenopathy Cardiovascular: heart regular rate and rythm Lungs: CTA bilaterally Abdomen: soft, non-tender, non-distended, no obvious ascites, no peritoneal signs, normal bowel sounds Extremities: no lower extremity edema bilaterally Skin: no lesions on visible extremities    Impression &  Recommendations:  Problem # 1:  Family history of colon cancer, antral thickening status post GIST removal from distal stomach her last colonoscopy was in 2004, given her family history of colon cancer she is due for a colonoscopy for high-risk screening are round now. We will arrange for that to be done at the same time as a repeat look in her stomach with endoscopic ultrasound. Her gastric antrum was somewhat thickened on followup CT scan several months ago. This is probably simple postoperative changes but warrants evaluation.  Patient Instructions: 1)  You will be scheduled to have a colonoscopy. 2)  You will be scheduled to have an upper endoscopic ultrasound, both at [facility] hospital. 3)  A copy of this information will be sent to Drs. Rene Paci, Harden Mo. 4)  The medication list was reviewed and reconciled.  All changed / newly prescribed medications were explained.  A complete medication list was provided to the patient / caregiver.  Appended Document: Orders Update/movi    Clinical Lists Changes  Problems: Added new problem of FM HX MALIGNANT NEOPLASM GASTROINTESTINAL TRACT (ICD-V16.0) Added new problem of NONSPECIFIC ABN FINDING RAD & OTH EXAM GI TRACT (ICD-793.4) Medications: Added new medication of MOVIPREP 100 GM  SOLR (PEG-KCL-NACL-NASULF-NA ASC-C) As per prep instructions. - Signed Rx of MOVIPREP 100 GM  SOLR (PEG-KCL-NACL-NASULF-NA ASC-C) As per prep instructions.;  #1 x 0;  Signed;  Entered by: Chales Abrahams CMA (AAMA);  Authorized by: Rachael Fee MD;  Method used: Electronically to [facility]. #[id number]*, [address] [place], [place], [place]  [address], Ph: [phone], Fax: [phone] Orders: Added new Test order of EUS-Upper (EUS-Upper) - Signed Added new Test order of ZCOL (ZCOL) - Signed    Prescriptions: MOVIPREP 100 GM  SOLR (PEG-KCL-NACL-NASULF-NA ASC-C) As per prep instructions.  #1 x 0   Entered by:   Chales Abrahams CMA  (AAMA)   Authorized by:   Rachael Fee MD   Signed by:   Chales Abrahams CMA (AAMA) on 02/13/2010   Method used:   Electronically to        [facility]. [phone]* (retail)       [address]       [place], [place]  [address]       Ph: [phone]       Fax: [phone]   RxID:   [id number]

## 2010-12-01 NOTE — Letter (Signed)
Summary: New Patient letter  [facility]  [address] [place], [place] [address]   Phone: [phone]  Fax: [phone]       01/14/2010 MRN: [id number]  [facility] [address] [place], [place]  [address]  [place]  Dear Ms. Dvorsky,  Welcome to the Gastroenterology Division at [facility].    You are scheduled to see Dr.  Christella Hartigan on 4.15.2011 at 9:00AM on the 3rd floor at [facility], [address]  We ask that you try to arrive at our office 15 minutes prior to your appointment time to allow for check-in.  We would like you to complete the enclosed self-administered evaluation form prior to your visit and bring it with you on the day of your appointment.  We will review it with you.  Also, please bring a complete list of all your medications or, if you prefer, bring the medication bottles and we will list them.  Please bring your insurance card so that we may make a copy of it.  If your insurance requires a referral to see a specialist, please bring your referral form from your primary care physician.  Co-payments are due at the time of your visit and may be paid by cash, check or credit card.     Your office visit will consist of a consult with your physician (includes a physical exam), any laboratory testing he/she may order, scheduling of any necessary diagnostic testing (e.g. x-ray, ultrasound, CT-scan), and scheduling of a procedure (e.g. Endoscopy, Colonoscopy) if required.  Please allow enough time on your schedule to allow for any/all of these possibilities.    If you cannot keep your appointment, please call [phone] to cancel or reschedule prior to your appointment date.  This allows [place] the opportunity to schedule an appointment for another patient in need of care.  If you do not cancel or reschedule by 5 p.m. the business day prior to your appointment date, you will be charged a $50.00 late cancellation/no-show fee.    Thank you for choosing  [facility] for your medical needs.  We appreciate the opportunity to care for you.  Please visit [place] at our website  to learn more about our practice.                     Sincerely,                                                             The Gastroenterology Division

## 2010-12-01 NOTE — Progress Notes (Signed)
  labs scheduled in IDX, pt informed  ---- Converted from flag ---- ---- 02/02/2010 10:37 AM, Newt Lukes MD wrote: ok to enter in idx under 272.4 (lucy can help if needed)  ---- 02/02/2010 10:29 AM, Margaret Pyle, CMA wrote: Can you please put in an order for lipid panel so I can schedule in IDX per pt request. Thx ------------------------------

## 2010-12-01 NOTE — Procedures (Signed)
Summary: Endoscopic Ultrasound  Patient: [name] Note: All result statuses are Final unless otherwise noted.  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  The  endoscope was introduced through the     mouth  and advanced to the distal stomach.     <<PROCEDUREIMAGES>>           Endoscopic findings (limited views with radial echoendoscope):     1. Normal esophagus.     2. There is scar tissue in distal stomach that distorts the     gastric anatomy somewhat.  There are no clear subepithelial     bulges, masses.           EUS findings:     1. Distorted gastric echolayering of wall at site of previously     resected GIST, no residual/recurrent tumor.           Impression:     Normal, postoperative gastric anatomy without sign of residual,     recurrent GIST.     Will plan on repeat EGD in 12 months.           ______________________________     Rachael Fee, MD           cc: Harden Mo, MD; Herbert Moors, MD           n.     eSIGNED:   Rachael Fee at 03/12/2010 01:36 PM           Etheleen Mayhew, [id number]  Note: An exclamation mark (!) indicates a result that was not  dispersed into the flowsheet. Document Creation Date: 03/12/2010 1:37 PM _______________________________________________________________________  (1) Order result status: Final Collection or observation date-time: 03/12/2010 13:11 Requested date-time:  Receipt date-time:  Reported date-time:  Referring Physician:   Ordering Physician: Rob Bunting [phone]) Specimen Source:  Source: Launa Grill Order Number: [id number] Lab site:

## 2010-12-03 NOTE — Progress Notes (Signed)
Summary: Rx refill on simvastatin.  Phone Note Call from Patient   Reason for Call: Refill Medication Summary of Call: Patient needs Simvastatin 20mg  tabs to last until cpx/labs scheduled for 12/01/10. Per Lucy okay. Initial call taken by: Daphane Shepherd,  November 04, 2010 11:21 AM  Follow-up for Phone Call        yes - ok to fill as requested  Follow-up by: Newt Lukes MD,  November 04, 2010 11:25 AM  Additional Follow-up for Phone Call Additional follow up Details #1::        Refill sent x's 1 must keep physical appt for addtional refills. Pt was notified Additional Follow-up by: Orlan Leavens RMA,  November 04, 2010 11:31 AM    Prescriptions: SIMVASTATIN 20 MG TABS (SIMVASTATIN) Take 1 tablet by mouth once a day  #30 Tablet x 0   Entered by:   Orlan Leavens RMA   Authorized by:   Newt Lukes MD   Signed by:   Orlan Leavens RMA on 11/04/2010   Method used:   Electronically to        [facility]. [phone]* (retail)       [address]       [place], [place]  [address]       Ph: [phone]       Fax: [phone]   RxID:   [id number]

## 2010-12-04 NOTE — Procedures (Signed)
Summary: Prep/[facility]  Prep/[facility]   Imported By: Lester [place] 02/18/2010 08:09:52  _____________________________________________________________________  External Attachment:    Type:   Image     Comment:   External Document

## 2010-12-09 ENCOUNTER — Encounter: Payer: Self-pay | Admitting: Internal Medicine

## 2010-12-09 NOTE — Assessment & Plan Note (Signed)
Summary: cpx/lb   Vital Signs:  Patient profile:   60 year old female Height:      64 inches (162.56 cm) Weight:      137 pounds (62.27 kg) BMI:     23.60 O2 Sat:      97 % on Room air Temp:     98.3 degrees F (36.83 degrees C) oral Pulse rate:   78 / minute BP sitting:   118 / 62  (left arm) Cuff size:   regular  Vitals Entered By: Orlan Leavens RMA (December 01, 2010 2:05 PM)  O2 Flow:  Room air CC: CPX Is Patient Diabetic? No Pain Assessment Patient in pain? no        Primary Care Provider:  Newt Lukes MD  CC:  CPX.  History of Present Illness: patient is here today for annual physical. Patient feels well and has no complaints.   Preventive Screening-Counseling & Management  Alcohol-Tobacco     Alcohol drinks/day: <1     Alcohol Counseling: not indicated; use of alcohol is not excessive or problematic     Smoking Status: never     Tobacco Counseling: not indicated; no tobacco use  Caffeine-Diet-Exercise     Caffeine use/day: 1-2     Caffeine Counseling: not indicated; caffeine use is not excessive or problematic     Does Patient Exercise: no     Exercise Counseling: to improve exercise regimen     Depression Counseling: not indicated; screening negative for depression  Safety-Violence-Falls     Seat Belt Counseling: not indicated; patient wears seat belts     Helmet Counseling: not applicable     Firearm Counseling: not indicated; uses recommended firearm safety measures     Violence Counseling: not indicated; no violence risk noted  Clinical Review Panels:  Prevention   Last Mammogram:  Done @ [facility] Findings: The breast tissue is heterogeneously dense, which could obscure detection of isodense masses. The mammogram demonstrates no worrisome findings or significant interval change.  Impression: stable mammogram, no significant fingings BI-RADS 1 (09/28/2010)   Last Pap Smear:  Interpretation/Result:Negative for intraepithelial  Lesion or Malignancy.    (10/02/2007)   Last Colonoscopy:  DONE (03/12/2010)  Immunizations   Last Tetanus Booster:  Historical (11/02/2003)  Lipid Management   Cholesterol:  187 (11/25/2010)   LDL (bad choesterol):  124 (11/25/2010)   HDL (good cholesterol):  40.60 (11/25/2010)   Triglycerides:  113 (04/10/2007)  CBC   WBC:  5.4 (11/25/2010)   RBC:  5.00 (11/25/2010)   Hgb:  15.9 (11/25/2010)   Hct:  46.5 (11/25/2010)   Platelets:  237.0 (11/25/2010)   MCV  92.9 (11/25/2010)   MCHC  34.2 (11/25/2010)   RDW  13.7 (11/25/2010)   PMN:  47.2 (11/25/2010)   Lymphs:  38.2 (11/25/2010)   Monos:  7.0 (11/25/2010)   Eosinophils:  6.4 (11/25/2010)   Basophil:  1.2 (11/25/2010)  Complete Metabolic Panel   Glucose:  94 (11/25/2010)   Sodium:  142 (11/25/2010)   Potassium:  5.1 (11/25/2010)   Chloride:  102 (11/25/2010)   CO2:  31 (11/25/2010)   BUN:  12 (11/25/2010)   Creatinine:  0.9 (11/25/2010)   Albumin:  4.3 (11/25/2010)   Total Protein:  7.3 (11/25/2010)   Calcium:  10.2 (11/25/2010)   Total Bili:  1.0 (11/25/2010)   Alk Phos:  47 (11/25/2010)   SGPT (ALT):  22 (11/25/2010)   SGOT (AST):  25 (11/25/2010)   Current Medications (verified): 1)  Simvastatin 20 Mg Tabs (Simvastatin) .... Take 1 Tablet By Mouth Once A Day 2)  Coq10 100 Mg Caps (Coenzyme Q10) .... Take 1 By Mouth Qd 3)  Vitamin D3 1000 Unit Caps (Cholecalciferol) .... Once Daily 4)  Fish Oil 1000 Mg Caps (Omega-3 Fatty Acids) .... Qd 5)  Calcium 1500 Mg Tabs (Calcium Carbonate) .... Take 1 By Mouth Once Daily  Allergies (verified): 1)  ! Codeine 2)  ! Vicodin 3)  ! 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 Allergic rhinitis Anemia-NOS Depression osteoporosis - 2002 ; osteopenia 09/2010 (now off bisphos)  MD roster - psyc - [facility] -[facility]- [facility] -  [facility] - [place]  Past Surgical History: history of tubal ligation  gastric GIST removed 2009 Child birth 1977 Breast biopsy 1995 Tonsillectomy 1957   Family History: allergies: mother, sister,daughter  cancer:uncle (prostate), aunt (breast)  mother diagnosed with colon cancer  Social History: Patient never smoked social alcohol use pt is divorced, lives alone pt has 1 dtr pt is a retired Comptroller   Review of Systems       see HPI above. I have reviewed all other systems and they were negative.   Physical Exam  General:  alert, well-developed, well-nourished, and cooperative to examination.    Head:  Normocephalic and atraumatic without obvious abnormalities. No apparent alopecia or balding. Eyes:  vision grossly intact; pupils equal, round and reactive to light.  conjunctiva and lids normal.    Ears:  normal pinnae bilaterally, without erythema, swelling, or tenderness to palpation. TMs clear, without effusion, or cerumen impaction. Hearing grossly normal bilaterally  Mouth:  teeth and gums in good repair; mucous membranes moist, without lesions or ulcers. oropharynx clear without exudate, no erythema.  Neck:  supple, full ROM, no masses, no thyromegaly; no thyroid nodules or tenderness. no JVD or carotid bruits.   Lungs:  normal respiratory effort, no intercostal retractions or use of accessory muscles; normal breath sounds bilaterally - no crackles and no wheezes.    Heart:  normal rate, regular rhythm, no murmur, and no rub. BLE without edema. Abdomen:  soft, non-tender, normal bowel sounds, no distention; no masses and no appreciable hepatomegaly or splenomegaly.   Genitalia:  defer gyn Msk:  No deformity or scoliosis noted of thoracic or lumbar spine.   Neurologic:  alert & oriented X3 and cranial nerves II-XII symetrically intact.  strength normal in all extremities, sensation intact to light touch, and gait normal. speech fluent without dysarthria or aphasia;  follows commands with good comprehension.  Skin:  AK over right scapula and skin tag over left ant chest - otherwise, no rashes, vesicles, ulcers, or erythema. No nodules or irregularity to palpation.  Psych:  Oriented X3, memory intact for recent and remote, normally interactive, good eye contact, not anxious appearing, not depressed appearing, and not agitated.      Impression & Recommendations:  Problem # 1:  PREVENTIVE HEALTH CARE (ICD-V70.0) Patient has been counseled on age-appropriate routine health concerns for screening and prevention. These are reviewed and up-to-date. Immunizations are up-to-date or declined. Labs and ECG reviewed.  Orders: EKG w/ Interpretation (93[address])  Complete Medication List: 1)  Simvastatin 20 Mg Tabs (Simvastatin) .... Take 1 tablet by mouth once a day 2)  Coq10 100 Mg Caps (Coenzyme q10) .... Take 1 by mouth qd 3)  Vitamin D3 1000 Unit Caps (Cholecalciferol) .... Once daily 4)  Fish Oil 1000 Mg Caps (Omega-3 fatty acids) .... Qd 5)  Calcium 1500 Mg Tabs (Calcium carbonate) .... Take 1 by mouth once daily  Other Orders: Dermatology Referral (Derma)  Patient Instructions: 1)  it was good to see you today. 2)  labs reviewed - will watch the cholesterol but make no medication changes today - no other lab abnormalities 3)  refills done today 4)  we'll make referral to dermatology for your skin spots. Our office will contact you regarding this appointment once made.  5)  stay active and eat healthy food choices as you are doing 6)  Please schedule a follow-up appointment annually for medical physical, call sooner if problems.  Prescriptions: SIMVASTATIN 20 MG TABS (SIMVASTATIN) Take 1 tablet by mouth once a day  #30 Tablet x 11   Entered and Authorized by:   Newt Lukes MD   Signed by:   Newt Lukes MD on 12/01/2010   Method used:   Electronically to        [facility]. [phone]* (retail)       [address]        [place], [place]  [address]       Ph: [phone]       Fax: [phone]   RxID:   [id number]    Orders Added: 1)  EKG w/ Interpretation [93000] 2)  Est. Patient 40-64 years [[address]] 3)  Dermatology Referral [Derma]

## 2010-12-16 ENCOUNTER — Encounter: Payer: Self-pay | Admitting: Internal Medicine

## 2011-01-07 NOTE — Letter (Signed)
Summary: Leigh Aurora MD  R June Leap MD   Imported By: Lester [place] 12/30/2010 08:05:12  _____________________________________________________________________  External Attachment:    Type:   Image     Comment:   External Document

## 2011-02-15 LAB — CBC
HCT: 39.5 % (ref 36.0–46.0)
Hemoglobin: 12.4 g/dL (ref 12.0–15.0)
MCHC: 31.5 g/dL (ref 30.0–36.0)
MCHC: 32.3 g/dL (ref 30.0–36.0)
MCV: 80.1 fL (ref 78.0–100.0)
MCV: 81.1 fL (ref 78.0–100.0)
Platelets: 270 10*3/uL (ref 150–400)
Platelets: 320 10*3/uL (ref 150–400)
RBC: 4.34 MIL/uL (ref 3.87–5.11)
RBC: 4.87 MIL/uL (ref 3.87–5.11)
RDW: 17.5 % — ABNORMAL HIGH (ref 11.5–15.5)
WBC: 10.3 10*3/uL (ref 4.0–10.5)
WBC: 5.7 10*3/uL (ref 4.0–10.5)
WBC: 9.1 10*3/uL (ref 4.0–10.5)

## 2011-02-15 LAB — COMPREHENSIVE METABOLIC PANEL
ALT: 25 U/L (ref 0–35)
AST: 28 U/L (ref 0–37)
Albumin: 3.9 g/dL (ref 3.5–5.2)
Alkaline Phosphatase: 45 U/L (ref 39–117)
BUN: 16 mg/dL (ref 6–23)
CO2: 26 mEq/L (ref 19–32)
Calcium: 9.6 mg/dL (ref 8.4–10.5)
Chloride: 105 mEq/L (ref 96–112)
Creatinine, Ser: 0.83 mg/dL (ref 0.4–1.2)
GFR calc Af Amer: >60 mL/min (ref >60)
GFR calc non Af Amer: >60 mL/min (ref >60)
Glucose, Bld: 98 mg/dL (ref 70–99)
Potassium: 4.3 mEq/L (ref 3.5–5.1)
Sodium: 141 mEq/L (ref 135–145)
Total Bilirubin: 0.6 mg/dL (ref 0.3–1.2)
Total Protein: 7.1 g/dL (ref 6.0–8.3)

## 2011-02-15 LAB — DIFFERENTIAL
Basophils Absolute: 0.1 10*3/uL (ref 0.0–0.1)
Eosinophils Absolute: 0.2 10*3/uL (ref 0.0–0.7)
Eosinophils Relative: 4 % (ref 0–5)
Lymphocytes Relative: 34 % (ref 12–46)
Monocytes Absolute: 0.4 10*3/uL (ref 0.1–1.0)

## 2011-02-15 LAB — BASIC METABOLIC PANEL
BUN: 9 mg/dL (ref 6–23)
CO2: 28 mEq/L (ref 19–32)
Calcium: 9 mg/dL (ref 8.4–10.5)
Chloride: 101 mEq/L (ref 96–112)
Creatinine, Ser: 0.64 mg/dL (ref 0.4–1.2)
Creatinine, Ser: 0.75 mg/dL (ref 0.4–1.2)
GFR calc Af Amer: >60 mL/min (ref >60)

## 2011-03-16 NOTE — Discharge Summary (Signed)
[name], [name]              ACCOUNT NO.:  [id number]   MEDICAL RECORD NO.:  [id number]          PATIENT TYPE:  INP   LOCATION:  1539                         FACILITY:  [facility]   PHYSICIAN:  Juanetta Gosling, MDDATE OF BIRTH:  15-Nov-1950   DATE OF ADMISSION:  11/28/2008  DATE OF DISCHARGE:  12/03/2008                               DISCHARGE SUMMARY   ADMISSION DIAGNOSES:  1. Gastrointestinal stromal tumor.  2. Hypercholesterolemia.   DISCHARGE DIAGNOSES:  1. Hypercholesterolemia.  2. Open partial gastrectomy.  3. Status post esophagogastroduodenoscopy.  4. Gastrointestinal stromal tumor.   DISCHARGE MEDICATIONS:  Percocet p.r.n.   FOLLOW UP:  In 1 week with [facility] for staple removal.   PROCEDURES PERFORMED:  On 11/28/08:  1. Diagnostic laparoscopy.  2. Esophagogastroduodenoscopy.  3. Open gastric wedge resection for a gastrointestinal stromal tumor.   HOSPITAL COURSE:  [name] is a 60 year old female who in November had  an episode of bright red blood with projectile vomiting.  She eventually  underwent an evaluation by gastroenterology which showed an ulcer and a  possible mass and an EGD.  She underwent an endoscopic ultrasound which  showed a  subepithelial bulging lesion in the antrum about 2 to 3 cm  with a cleft that appeared to be a healing ulcer.  On a [place] this was 2 x  2.2 cm and appeared to be consistent with a GIST.  Biopsy showed that  this was c-Kit positive.  It showed spindle cell proliferation.  She  underwent a CT scan, which did not show any other abnormalities in her  abdomen.   She and I discussed laparoscopic possible open gastric wedge resection  for her GIST.  She was taken to the operating room on the 28th and  underwent diagnostic laparoscopy and EGD.  This area was very close to  her pylorus, so I elected to perform an open gastric wedge resection  which was done with clear margins.  Postoperatively, she had her diet  advanced and was passing flatus by postop day #4 and tolerating her  regular diet.  Her wound remained clean without any evidence of  infection.  She had no other complications.  She was discharged home on  postoperative day #5 with the followup described as above.     Juanetta Gosling, MD  Electronically Signed    MCW/MEDQ  D:  01/08/2009  T:  01/08/2009  Job:  [id number]

## 2011-03-16 NOTE — Op Note (Signed)
[name], [name]              ACCOUNT NO.:  [id number]   MEDICAL RECORD NO.:  [id number]          PATIENT TYPE:  INP   LOCATION:  0003                         [facility]ACILITY[facility]   PHYSICIAN:  Juanetta Gosling, MDDATE OF BIRTH:  [birth year]   DATE OF PROCEDURE:  11/28/2008  DATE OF DISCHARGE:                               OPERATIVE REPORT   PREOPERATIVE DIAGNOSES:  Antral mass with biopsy consistent with a  gastrointestinal stromal tumor.   POSTOPERATIVE DIAGNOSES:  Antral mass with biopsy consistent with a  gastrointestinal stromal tumor.   OPERATIVE PROCEDURE:  1. Diagnostic laparoscopy.  2. Esophagogastroduodenoscopy.  3. Open gastric wedge resection.   SURGEON:  Juanetta Gosling, MD   ASSISTANT:  Adolph Pollack, M.D.   ANESTHESIA:  General anesthesia.   FINDINGS:  A 3 cm submucosal mass in the antrum about 2 cm from the  pylorus.   SPECIMENS:  Gastric mass with margins grossly negative by me and the  pathology.   ESTIMATED BLOOD LOSS:  75 mL.   COMPLICATIONS:  None.   DRAINS:  None.   DISPOSITION:  To the recovery room in stable condition.   INDICATIONS FOR PROCEDURE:  This is a 60 year old female who in November  had projectile vomiting with bright red blood.  She was evaluated by Dr.  Evette Cristal and then underwent an EGD with a possible mass and an ulcer.  She  was then referred by Dr. Christella Hartigan and she underwent an EGD with the  finding of a subepithelial bulging lesion in the antrum about 3 cm  across with a mucosal cleft that appeared to be a healing ulcer.  The  EUS in this area appeared to be a submucosal based tumor and a needle  aspirate showed this to be consistent with GIST.  This was C-KIT  positive and showed spindle cell proliferation.  She also underwent a CT  scan that showed the gastric mass as well.  We discussed a laparoscopic  versus open wedge resection in this area with partial gastrectomy with a  Billroth I anastomosis if needed.   DESCRIPTION OF PROCEDURE:  After informed consent was obtained, the  patient was administered 1 gram of intravenous Ancef.  Sequential  compression devices were placed on the lower extremities prior to  induction.  She was then placed under general endotracheal anesthesia  without complication.  Her abdomen was then prepped and draped in a  standard sterile surgical fashion after her arms were tucked and  appropriately padded.  Surgical time out was then performed.   I made a 5 mm incision in the left upper quadrant an used an Optiview  trocar with a 5 mm 0 degree scope to enter into the abdomen without  difficulty.  The abdomen was then insufflated to 15 mmHg pressure.  I  then inserted a 5 mm trocar just below the umbilicus without difficulty.  I inserted one other additional 5 mm trocar in the left lower quadrant.  The antral mass was visible easily.  This appeared to be about 2-3 cm  from the pylorus.  An additional 5 mm trocar  was placed into the right  lower quadrant.  The stomach was then freed from the gastrocolic  ligament and then rolled up so I could view the posterior segment.  All  these attachments were taken down and it appeared at this point that the  mass was too close to the pylorus to be able to staple this out  laparoscopically. I think I would have given her a gastric outlet  obstruction if I were to do that.  I then used a Harmonic scalpel to  take down the falciform ligament.  I did perform an EGD and found the  mass.  This appeared to be very broad based and not a pedunculated mass.  Do to all these factors I elected to make a small upper midline inc.  I  pulled the antrum up into the and identified the mass and removed this  portion of the stomach with electrocautery.  The margins of this were  grossly negative.  This was sent down to pathology who also agreed that  these margins were grossly negative.  After excising the mass, I then  closed the gastrotomy in  Heinecke Mikulicz fashion with 2-0 Vicryl  sutures to close the serosa and oppose the mucosa.  I then used 3-0 silk  sutures to Lembert over that suture line.  This was hemostatic.  The  antrum of the stomach was still patent down into the pylorus.  There was  one other further bleeding area on the gastrocolic ligament that I had  taken down previously.  I put a figure-of-eight stitch in and stopped  this as well.  I confirmed the placement of an nasogastric tube  following this in the stomach.  Following this I then closed the fascia  with a #1 PDS.  The skin was then closed with staples.  All the  laparoscopic incisions were closed with staples.  Sterile dressings were  placed over all of these.  She tolerated this well, was transferred to  the recovery room in stable condition.      Juanetta Gosling, MD  Electronically Signed     MCW/MEDQ  D:  11/28/2008  T:  11/28/2008  Job:  [id number]   cc:   Rachael Fee, MD  [address]  [place], [place] [address]   Lavonda Jumbo, M.D.  Fax: [phone]

## 2011-03-19 NOTE — Op Note (Signed)
NAME:  [name], [name]                        ACCOUNT NO.:  [id number]   MEDICAL RECORD NO.:  [id number]                   PATIENT TYPE:  AMB   LOCATION:  ENDO                                 FACILITY:  [facility]   PHYSICIAN:  Anselmo Rod, M.D.               DATE OF BIRTH:  Mar 19, 1951   DATE OF PROCEDURE:  04/22/2003  DATE OF DISCHARGE:                                 OPERATIVE REPORT   PROCEDURE PERFORMED:  Colonoscopy.   ENDOSCOPIST:  Charna Elizabeth, M.D.   INSTRUMENT USED:  Olympus video colonoscope.   INDICATIONS FOR PROCEDURE:  Change in bowel habits and family history of  colon cancer in a 60 year old white female.  Rule out colonic polyps,  masses, etc.   PREPROCEDURE PREPARATION:  Informed consent was procured from the patient.  The patient was fasted for eight hours prior to the procedure and prepped  with a bottle of magnesium citrate and a gallon of GoLYTELY the night prior  to the procedure.   PREPROCEDURE PHYSICAL:  The patient had stable vital signs.  Neck supple.  Chest clear to auscultation.  S1 and S2 regular.  Abdomen soft with normal  bowel sounds.   DESCRIPTION OF PROCEDURE:  The patient was placed in left lateral decubitus  position and sedated with 100 mg of Demerol and 7.5 mg of Versed  intravenously.  Once the patient was adequately sedated and maintained on  low flow oxygen and continuous cardiac monitoring, the Olympus video  colonoscope was advanced from the rectum to the cecum with difficulty.  The  initial scope used was the pediatric adjustable scope.  This could not be  advanced beyond the splenic flexure and therefore the scope was withdrawn  and the adult scope used instead.  The patient's position was changed from  the left lateral to the supine and right lateral position on several  occasions to reach the cecal base.  The appendicular orifice and ileocecal  valve were visualized and photographed.  No masses, polyps, erosions,  ulcerations or  diverticula were seen.  There was a significant amount of  residual stool in the colon, multiple washes were done.  The patient  tolerated the procedure well without complications.  Retroflexion in the  rectum revealed no abnormalities as well.   IMPRESSION:  1. Healthy-appearing colonic mucosa.  2. Very tortuous colon requiring changing of the scope from a pediatric     adjustable colonoscope to an adult scope.  3. No masses, polyps, or diverticulosis noted.   RECOMMENDATIONS:  1. A high fiber diet with liberal fluid intake has been advocated.  2. Repeat colorectal cancer screening is recommended in the next 5 years     unless the patient develops any abnormal symptoms in the interim.  3. Outpatient follow-up in the next two weeks for further recommendations.  Anselmo Rod, M.D.    JNM/MEDQ  D:  04/22/2003  T:  04/23/2003  Job:  [id number]   cc:   Tammy R. Collins [place], M.D.  [id number]  [place]  [place] [address]  Fax: [phone]

## 2011-04-28 ENCOUNTER — Encounter: Payer: Self-pay | Admitting: Gastroenterology

## 2011-05-03 ENCOUNTER — Encounter: Payer: Self-pay | Admitting: Internal Medicine

## 2011-06-25 ENCOUNTER — Encounter: Payer: Self-pay | Admitting: Gastroenterology

## 2011-07-07 ENCOUNTER — Encounter: Payer: Self-pay | Admitting: Gastroenterology

## 2011-07-07 ENCOUNTER — Ambulatory Visit (AMBULATORY_SURGERY_CENTER): Payer: BC Managed Care – PPO | Admitting: *Deleted

## 2011-07-07 VITALS — Ht 64.0 in | Wt 116.0 lb

## 2011-07-07 DIAGNOSIS — Z8509 Personal history of malignant neoplasm of other digestive organs: Secondary | ICD-10-CM

## 2011-07-19 ENCOUNTER — Ambulatory Visit (AMBULATORY_SURGERY_CENTER): Payer: BC Managed Care – PPO | Admitting: Gastroenterology

## 2011-07-19 ENCOUNTER — Encounter: Payer: Self-pay | Admitting: Gastroenterology

## 2011-07-19 VITALS — BP 131/48 | HR 73 | Temp 97.1°F | Resp 16 | Ht 64.0 in | Wt 116.0 lb

## 2011-07-19 DIAGNOSIS — Z8509 Personal history of malignant neoplasm of other digestive organs: Secondary | ICD-10-CM

## 2011-07-19 MED ORDER — SODIUM CHLORIDE 0.9 % IV SOLN: 500.0000 mL | INTRAVENOUS | Status: DC

## 2011-07-19 NOTE — Patient Instructions (Signed)
You may resume your medications as you would normally take them.  Please refer to your blue and neon green sheets for instructions regarding diet and activity for the rest of today.

## 2011-07-20 ENCOUNTER — Telehealth: Payer: Self-pay

## 2011-07-20 NOTE — Telephone Encounter (Signed)

## 2011-07-26 ENCOUNTER — Ambulatory Visit: Payer: Self-pay | Admitting: Gastroenterology

## 2011-10-01 ENCOUNTER — Ambulatory Visit (INDEPENDENT_AMBULATORY_CARE_PROVIDER_SITE_OTHER): Payer: BC Managed Care – PPO | Admitting: Internal Medicine

## 2011-10-01 ENCOUNTER — Encounter: Payer: Self-pay | Admitting: Internal Medicine

## 2011-10-01 VITALS — BP 100/62 | HR 72 | Temp 98.2°F | Ht 64.0 in | Wt 118.1 lb

## 2011-10-01 DIAGNOSIS — H00039 Abscess of eyelid unspecified eye, unspecified eyelid: Secondary | ICD-10-CM

## 2011-10-01 MED ORDER — ERYTHROMYCIN 5 MG/GM OP OINT: TOPICAL_OINTMENT | OPHTHALMIC | Status: DC

## 2011-10-01 NOTE — Assessment & Plan Note (Addendum)
Very small, no hx of mrsa, 10 mm now after spont decompression, for emycin opth ointment asd,  to f/u any worsening symptoms or concerns

## 2011-10-01 NOTE — Patient Instructions (Signed)
Take all new medications as prescribed Continue all other medications as before  

## 2011-10-02 ENCOUNTER — Encounter: Payer: Self-pay | Admitting: Internal Medicine

## 2011-10-02 NOTE — Progress Notes (Signed)
  Subjective:    Patient ID: [name], female    DOB: 27-Mar-1951, 60 y.o.   MRN: [id number]  HPI  Here with 1 wk onset stye like lesion to right mid lower lid, but became worse until yesterday when somewhat decompressed/drained but today still tender/red/swollen;  No fever, vision blurred, HA, chills, ST, cough or worsening lid swelling today. No hx of MRSA, MRSA contact or other sick contacts. Past Medical History  Diagnosis Date  . Actinic keratosis 12/01/2010  . ALLERGIC RHINITIS 09/10/2009  . ANEMIA-NOS 09/10/2009  . CERUMEN IMPACTION, BILATERAL 12/11/2009  . DEPRESSION 09/10/2009  . FIBROIDS, UTERUS 09/10/2009  . HYPERLIPIDEMIA 10/16/2008  . MEDIASTINAL LYMPHADENOPATHY 10/16/2008  . 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 NONSPECIFIC ABN FINDING RAD & OTH EXAM GI TRACT 02/13/2010  . OSTEOPENIA 09/22/2009  . OSTEOPOROSIS 09/22/2009  . Cataract   . Macular degeneration of both eyes 2012    mild   Past Surgical History  Procedure Date  . Tubal ligation   . Gastric gist removed 2009  . Child birth 36  . Breast surgery     biospy  . Tonsillectomy 1957  . Upper gastrointestinal endoscopy   . Stomach surgery   . Colonoscopy     reports that she has never smoked. She has never used smokeless tobacco. She reports that she drinks alcohol. She reports that she does not use illicit drugs. family history includes Allergies in her daughter, mother, and sister; Breast cancer in her maternal aunt; Colon cancer in her mother; Esophageal cancer in her maternal grandfather; and Prostate cancer in her maternal uncle.  There is no history of Stomach cancer. Allergies  Allergen Reactions  . Codeine     REACTION: nausea  . Hydrocodone-Acetaminophen   . Morphine And Related Nausea Only    Intense nausea   Current Outpatient Prescriptions on File Prior to Visit  Medication Sig Dispense Refill  . Cholecalciferol (VITAMIN D3) 1000 UNITS CAPS Take by mouth daily.         . Coenzyme Q10 (COQ10) 100 MG CAPS Take by mouth daily.         Review of Systems All otherwise neg per pt      Objective:   Physical Exam BP 100/62  Pulse 72  Temp(Src) 98.2 F (36.8 C) (Oral)  Ht 5\' 4"  (1.626 m)  Wt 118 lb 2 oz (53.581 kg)  BMI 20.28 kg/m2  SpO2 95% Physical Exam  VS noted Constitutional: Pt appears well-developed and well-nourished.  Right Ear: External ear normal.  Left Ear: External ear normal.  Eyes: Conjunctivae and EOM are normal. Pupils are equal, round, and reactive to light.  Neck: Normal range of motion. Neck supple.  Cardiovascular: Normal rate and regular rhythm.   Pulmonary/Chest: Effort normal and breath sounds normal.  Neurological: Pt is alert. No cranial nerve deficit.  Skin: Skin is warm. No erythema. Except for right low mid eyelid with 10 mm area red, swelling/tender without fluctuance or drainage  Psychiatric: Pt behavior is normal. Thought content normal. 1+ nervous    Assessment & Plan:

## 2011-10-12 ENCOUNTER — Encounter: Payer: Self-pay | Admitting: Internal Medicine

## 2011-10-15 ENCOUNTER — Encounter: Payer: Self-pay | Admitting: Internal Medicine

## 2012-08-02 ENCOUNTER — Other Ambulatory Visit (INDEPENDENT_AMBULATORY_CARE_PROVIDER_SITE_OTHER): Payer: BC Managed Care – PPO

## 2012-08-02 DIAGNOSIS — Z0000 Encounter for general adult medical examination without abnormal findings: Secondary | ICD-10-CM

## 2012-08-02 LAB — URINALYSIS, ROUTINE W REFLEX MICROSCOPIC
Leukocytes, UA: NEGATIVE
Specific Gravity, Urine: 1.01 (ref 1.000–1.030)
Urobilinogen, UA: 0.2 (ref 0.0–1.0)

## 2012-08-02 LAB — BASIC METABOLIC PANEL
BUN: 12 mg/dL (ref 6–23)
CO2: 29 mEq/L (ref 19–32)
Calcium: 9.5 mg/dL (ref 8.4–10.5)
Chloride: 105 mEq/L (ref 96–112)
Creatinine, Ser: 0.8 mg/dL (ref 0.4–1.2)

## 2012-08-02 LAB — CBC WITH DIFFERENTIAL/PLATELET
Basophils Relative: 1.1 % (ref 0.0–3.0)
Eosinophils Absolute: 0.4 10*3/uL (ref 0.0–0.7)
Hemoglobin: 15.4 g/dL — ABNORMAL HIGH (ref 12.0–15.0)
Lymphocytes Relative: 45.2 % (ref 12.0–46.0)
MCHC: 33.2 g/dL (ref 30.0–36.0)
Neutro Abs: 2.2 10*3/uL (ref 1.4–7.7)
RBC: 4.94 Mil/uL (ref 3.87–5.11)

## 2012-08-02 LAB — HEPATIC FUNCTION PANEL
AST: 24 U/L (ref 0–37)
Alkaline Phosphatase: 51 U/L (ref 39–117)
Bilirubin, Direct: 0.1 mg/dL (ref 0.0–0.3)
Total Protein: 6.9 g/dL (ref 6.0–8.3)

## 2012-08-02 LAB — TSH: TSH: 2.39 u[IU]/mL (ref 0.35–5.50)

## 2012-08-02 LAB — LIPID PANEL: VLDL: 19 mg/dL (ref 0.0–40.0)

## 2012-08-09 ENCOUNTER — Encounter: Payer: Self-pay | Admitting: Internal Medicine

## 2012-08-09 ENCOUNTER — Ambulatory Visit (INDEPENDENT_AMBULATORY_CARE_PROVIDER_SITE_OTHER): Payer: BC Managed Care – PPO | Admitting: Internal Medicine

## 2012-08-09 VITALS — BP 102/76 | HR 83 | Temp 98.2°F | Ht 64.0 in | Wt 116.0 lb

## 2012-08-09 DIAGNOSIS — M7541 Impingement syndrome of right shoulder: Secondary | ICD-10-CM

## 2012-08-09 DIAGNOSIS — G252 Other specified forms of tremor: Secondary | ICD-10-CM

## 2012-08-09 DIAGNOSIS — E785 Hyperlipidemia, unspecified: Secondary | ICD-10-CM

## 2012-08-09 DIAGNOSIS — G250 Essential tremor: Secondary | ICD-10-CM | POA: Insufficient documentation

## 2012-08-09 DIAGNOSIS — Z0000 Encounter for general adult medical examination without abnormal findings: Secondary | ICD-10-CM

## 2012-08-09 MED ORDER — PROPRANOLOL HCL ER 60 MG PO CP24: 60.0000 mg | ORAL_CAPSULE | Freq: Every day | ORAL | Status: DC

## 2012-08-09 MED ORDER — SIMVASTATIN 20 MG PO TABS: 20.0000 mg | ORAL_TABLET | Freq: Every day | ORAL | Status: DC

## 2012-08-09 NOTE — Progress Notes (Signed)
Subjective:    Patient ID: [name], female    DOB: [birth year], 61 y.o.   MRN: [id number]  HPI patient is here today for annual physical. Patient feels well overall.  Complains of increased right hand tremor, chronically present but increasing symptoms over past 6 months. Now affecting ability to eat and write  Also complains of right shoulder pain. No injury or overuse. Pain with overhead motion or behind back reaching activity. No weakness or numbness in right hand. No neck pain  Past Medical History  Diagnosis Date  . Actinic keratosis   . ALLERGIC RHINITIS   . ANEMIA-NOS   . DEPRESSION   . FIBROIDS, UTERUS   . HYPERLIPIDEMIA   . Sarcoidosis 2009  . OSTEOPENIA 09/2010 DEXA  . OSTEOPOROSIS 2002    on bisphos  . Macular degeneration of both eyes 2012    mild   Family History  Problem Relation Age of Onset  . Allergies Mother   . Colon cancer Mother   . Allergies Sister   . Allergies Daughter   . Breast cancer Maternal Aunt   . Prostate cancer Maternal Uncle   . Esophageal cancer Maternal Grandfather   . Stomach cancer Neg Hx    History  Substance Use Topics  . Smoking status: Never Smoker   . Smokeless tobacco: Never Used   Comment: Divorced, lives alone. 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 Alcohol Use: 0.0 oz/week    2-4 Glasses of wine per week     social    Review of Systems Constitutional: Negative for fever or weight change.  Respiratory: Negative for cough and shortness of breath.   Cardiovascular: Negative for chest pain or palpitations.  Gastrointestinal: Negative for abdominal pain, no bowel changes.  Musculoskeletal: Negative for gait problem or joint swelling.  Skin: Negative for rash.  Neurological: Negative for dizziness or headache.  complains of increased tremor right hand No other specific complaints in a complete review of systems (except as listed in HPI above).     Objective:   Physical Exam BP 102/76  Pulse 83  Temp 98.2 F (36.8  C) (Oral)  Ht 5\' 4"  (1.626 m)  Wt 116 lb (52.617 kg)  BMI 19.91 kg/m2  SpO2 96% Wt Readings from Last 3 Encounters:  08/09/12 116 lb (52.617 kg)  10/01/11 118 lb 2 oz (53.581 kg)  07/19/11 116 lb (52.617 kg)   Constitutional: She appears well-developed and well-nourished. No distress.  HENT: Head: Normocephalic and atraumatic. Ears: B TMs ok, no erythema or effusion; Nose: Nose normal. Mouth/Throat: Oropharynx is clear and moist. No oropharyngeal exudate.  Eyes: Conjunctivae and EOM are normal. Pupils are equal, round, and reactive to light. No scleral icterus.  Neck: Normal range of motion. Neck supple. No JVD present. No thyromegaly present.  Cardiovascular: Normal rate, regular rhythm and normal heart sounds.  No murmur heard. No BLE edema. Pulmonary/Chest: Effort normal and breath sounds normal. No respiratory distress. She has no wheezes.  Abdominal: Soft. Bowel sounds are normal. She exhibits no distension. There is no tenderness. no masses Musculoskeletal: R Shoulder: Full range of motion. Neurovascularly intact distally. Good strength with stress of rotator cuff but causes pain. Positive impingement signs. Neurological: Marked resting tremor of right upper extremity greater than left upper extremity. No head neck or voice tremor. She is alert and oriented to person, place, and time. No cranial nerve deficit. Coordination, speech, balance and gait are normal.  Skin: Skin is warm and dry.  No rash noted. No erythema.  Psychiatric: She has a normal mood and affect. Her behavior is normal. Judgment and thought content normal.   Lab Results  Component Value Date   WBC 5.4 08/02/2012   HGB 15.4* 08/02/2012   HCT 46.3* 08/02/2012   PLT 273.0 08/02/2012   GLUCOSE 96 08/02/2012   CHOL 264* 08/02/2012   TRIG 95.0 08/02/2012   HDL 45.70 08/02/2012   LDLDIRECT 193.5 08/02/2012   LDLCALC 124* 11/25/2010   ALT 19 08/02/2012   AST 24 08/02/2012   NA 141 08/02/2012   K 5.2* 08/02/2012   CL 105  08/02/2012   CREATININE 0.8 08/02/2012   BUN 12 08/02/2012   CO2 29 08/02/2012   TSH 2.39 08/02/2012        Assessment & Plan:  CPX/v70.0 - Patient has been counseled on age-appropriate routine health concerns for screening and prevention. These are reviewed and up-to-date. Immunizations are up-to-date or declined. Labs reviewed.  Right shoulder impingement syndrome -diagnosis evident on exam today. Education and reassurance provided. She declines steroid injection -Exercises provided, she agrees to call if symptoms worse

## 2012-08-09 NOTE — Patient Instructions (Signed)
It was good to see you today. We have reviewed your prior records including labs and tests today Health Maintenance reviewed - prescription given to you for shingles vaccine to take to your pharmacy -all other recommended immunizations and age-appropriate screenings are up-to-date or declined. Medications reviewed, resume simvastatin for treatment of cholesterol at this time. Also start Inderal for tremor. No other treatment changes recommended Your prescription(s) have been submitted to your pharmacy. Please take as directed and contact our office if you believe you are having problem(s) with the medication(s). we'll make referral to Dr. Arbutus Leas for evaluation and treatment of your tremor . Our office will contact you regarding appointment(s) once made. Please schedule followup in 6 months for lipid check - call sooner if problems.  Impingement Syndrome, Rotator Cuff, Bursitis with Rehab Impingement syndrome is a condition that involves inflammation of the tendons of the rotator cuff and the subacromial bursa, that causes pain in the shoulder. The rotator cuff consists of four tendons and muscles that control much of the shoulder and upper arm function. The subacromial bursa is a fluid filled sac that helps reduce friction between the rotator cuff and one of the bones of the shoulder (acromion). Impingement syndrome is usually an overuse injury that causes swelling of the bursa (bursitis), swelling of the tendon (tendonitis), and/or a tear of the tendon (strain). Strains are classified into three categories. Grade 1 strains cause pain, but the tendon is not lengthened. Grade 2 strains include a lengthened ligament, due to the ligament being stretched or partially ruptured. With grade 2 strains there is still function, although the function may be decreased. Grade 3 strains include a complete tear of the tendon or muscle, and function is usually impaired. SYMPTOMS    Pain around the shoulder, often at the  outer portion of the upper arm.   Pain that gets worse with shoulder function, especially when reaching overhead or lifting.   Sometimes, aching when not using the arm.   Pain that wakes you up at night.   Sometimes, tenderness, swelling, warmth, or redness over the affected area.   Loss of strength.   Limited motion of the shoulder, especially reaching behind the back (to the back pocket or to unhook bra) or across your body.   Crackling sound (crepitation) when moving the arm.   Biceps tendon pain and inflammation (in the front of the shoulder). Worse when bending the elbow or lifting.  CAUSES   Impingement syndrome is often an overuse injury, in which chronic (repetitive) motions cause the tendons or bursa to become inflamed. A strain occurs when a force is paced on the tendon or muscle that is greater than it can withstand. Common mechanisms of injury include: Stress from sudden increase in duration, frequency, or intensity of training.  Direct hit (trauma) to the shoulder.   Aging, erosion of the tendon with normal use.   Bony bump on shoulder (acromial spur).  RISK INCREASES WITH:  Contact sports (football, wrestling, boxing).   Throwing sports (baseball, tennis, volleyball).   Weightlifting and bodybuilding.   Heavy labor.   Previous injury to the rotator cuff, including impingement.   Poor shoulder strength and flexibility.   Failure to warm up properly before activity.   Inadequate protective equipment.   Old age.   Bony bump on shoulder (acromial spur).  PREVENTION    Warm up and stretch properly before activity.   Allow for adequate recovery between workouts.   Maintain physical fitness:   Strength, flexibility,  and endurance.   Cardiovascular fitness.   Learn and use proper exercise technique.  PROGNOSIS   If treated properly, impingement syndrome usually goes away within 6 weeks. Sometimes surgery is required.   RELATED COMPLICATIONS     Longer healing time if not properly treated, or if not given enough time to heal.   Recurring symptoms, that result in a chronic condition.   Shoulder stiffness, frozen shoulder, or loss of motion.   Rotator cuff tendon tear.   Recurring symptoms, especially if activity is resumed too soon, with overuse, with a direct blow, or when using poor technique.  TREATMENT   Treatment first involves the use of ice and medicine, to reduce pain and inflammation. The use of strengthening and stretching exercises may help reduce pain with activity. These exercises may be performed at home or with a therapist. If non-surgical treatment is unsuccessful after more than 6 months, surgery may be advised. After surgery and rehabilitation, activity is usually possible in 3 months.   MEDICATION  If pain medicine is needed, nonsteroidal anti-inflammatory medicines (aspirin and ibuprofen), or other minor pain relievers (acetaminophen), are often advised.   Do not take pain medicine for 7 days before surgery.   Prescription pain relievers may be given, if your caregiver thinks they are needed. Use only as directed and only as much as you need.   Corticosteroid injections may be given by your caregiver. These injections should be reserved for the most serious cases, because they may only be given a certain number of times.  HEAT AND COLD  Cold treatment (icing) should be applied for 10 to 15 minutes every 2 to 3 hours for inflammation and pain, and immediately after activity that aggravates your symptoms. Use ice packs or an ice massage.   Heat treatment may be used before performing stretching and strengthening activities prescribed by your caregiver, physical therapist, or athletic trainer. Use a heat pack or a warm water soak.  SEEK MEDICAL CARE IF:    Symptoms get worse or do not improve in 4 to 6 weeks, despite treatment.   New, unexplained symptoms develop. (Drugs used in treatment may produce side  effects.)  EXERCISES   RANGE OF MOTION (ROM) AND STRETCHING EXERCISES - Impingement Syndrome (Rotator Cuff  Tendinitis, Bursitis) These exercises may help you when beginning to rehabilitate your injury. Your symptoms may go away with or without further involvement from your physician, physical therapist or athletic trainer. While completing these exercises, remember:    Restoring tissue flexibility helps normal motion to return to the joints. This allows healthier, less painful movement and activity.   An effective stretch should be held for at least 30 seconds.   A stretch should never be painful. You should only feel a gentle lengthening or release in the stretched tissue.  STRETCH  Flexion, Standing  Stand with good posture. With an underhand grip on your right / left hand, and an overhand grip on the opposite hand, grasp a broomstick or cane so that your hands are a little more than shoulder width apart.   Keeping your right / left elbow straight and shoulder muscles relaxed, push the stick with your opposite hand, to raise your right / left arm in front of your body and then overhead. Raise your arm until you feel a stretch in your right / left shoulder, but before you have increased shoulder pain.   Try to avoid shrugging your right / left shoulder as your arm rises, by keeping your  shoulder blade tucked down and toward your mid-back spine. Hold for __________ seconds.   Slowly return to the starting position.  Repeat __________ times. Complete this exercise __________ times per day. STRETCH  Abduction, Supine  Lie on your back. With an underhand grip on your right / left hand and an overhand grip on the opposite hand, grasp a broomstick or cane so that your hands are a little more than shoulder width apart.   Keeping your right / left elbow straight and your shoulder muscles relaxed, push the stick with your opposite hand, to raise your right / left arm out to the side of your body  and then overhead. Raise your arm until you feel a stretch in your right / left shoulder, but before you have increased shoulder pain.   Try to avoid shrugging your right / left shoulder as your arm rises, by keeping your shoulder blade tucked down and toward your mid-back spine. Hold for __________ seconds.   Slowly return to the starting position.  Repeat __________ times. Complete this exercise __________ times per day. ROM  Flexion, Active-Assisted  Lie on your back. You may bend your knees for comfort.   Grasp a broomstick or cane so your hands are about shoulder width apart. Your right / left hand should grip the end of the stick, so that your hand is positioned "thumbs-up," as if you were about to shake hands.   Using your healthy arm to lead, raise your right / left arm overhead, until you feel a gentle stretch in your shoulder. Hold for __________ seconds.   Use the stick to assist in returning your right / left arm to its starting position.  Repeat __________ times. Complete this exercise __________ times per day.   ROM - Internal Rotation, Supine   Lie on your back on a firm surface. Place your right / left elbow about 60 degrees away from your side. Elevate your elbow with a folded towel, so that the elbow and shoulder are the same height.   Using a broomstick or cane and your strong arm, pull your right / left hand toward your body until you feel a gentle stretch, but no increase in your shoulder pain. Keep your shoulder and elbow in place throughout the exercise.   Hold for __________ seconds. Slowly return to the starting position.  Repeat __________ times. Complete this exercise __________ times per day. STRETCH - Internal Rotation  Place your right / left hand behind your back, palm up.   Throw a towel or belt over your opposite shoulder. Grasp the towel with your right / left hand.   While keeping an upright posture, gently pull up on the towel, until you feel a  stretch in the front of your right / left shoulder.   Avoid shrugging your right / left shoulder as your arm rises, by keeping your shoulder blade tucked down and toward your mid-back spine.   Hold for __________ seconds. Release the stretch, by lowering your healthy hand.  Repeat __________ times. Complete this exercise __________ times per day. ROM - Internal Rotation   Using an underhand grip, grasp a stick behind your back with both hands.   While standing upright with good posture, slide the stick up your back until you feel a mild stretch in the front of your shoulder.   Hold for __________ seconds. Slowly return to your starting position.  Repeat __________ times. Complete this exercise __________ times per day.   STRETCH  Posterior Shoulder Capsule   Stand or sit with good posture. Grasp your right / left elbow and draw it across your chest, keeping it at the same height as your shoulder.   Pull your elbow, so your upper arm comes in closer to your chest. Pull until you feel a gentle stretch in the back of your shoulder.   Hold for __________ seconds.  Repeat __________ times. Complete this exercise __________ times per day. STRENGTHENING EXERCISES - Impingement Syndrome (Rotator Cuff Tendinitis, Bursitis) These exercises may help you when beginning to rehabilitate your injury. They may resolve your symptoms with or without further involvement from your physician, physical therapist or athletic trainer. While completing these exercises, remember:  Muscles can gain both the endurance and the strength needed for everyday activities through controlled exercises.   Complete these exercises as instructed by your physician, physical therapist or athletic trainer. Increase the resistance and repetitions only as guided.   You may experience muscle soreness or fatigue, but the pain or discomfort you are trying to eliminate should never worsen during these exercises. If this pain does get  worse, stop and make sure you are following the directions exactly. If the pain is still present after adjustments, discontinue the exercise until you can discuss the trouble with your clinician.   During your recovery, avoid activity or exercises which involve actions that place your injured hand or elbow above your head or behind your back or head. These positions stress the tissues which you are trying to heal.  STRENGTH - Scapular Depression and Adduction   With good posture, sit on a firm chair. Support your arms in front of you, with pillows, arm rests, or on a table top. Have your elbows in line with the sides of your body.   Gently draw your shoulder blades down and toward your mid-back spine. Gradually increase the tension, without tensing the muscles along the top of your shoulders and the back of your neck.   Hold for __________ seconds. Slowly release the tension and relax your muscles completely before starting the next repetition.   After you have practiced this exercise, remove the arm support and complete the exercise in standing as well as sitting position.  Repeat __________ times. Complete this exercise __________ times per day.   STRENGTH - Shoulder Abductors, Isometric  With good posture, stand or sit about 4-6 inches from a wall, with your right / left side facing the wall.   Bend your right / left elbow. Gently press your right / left elbow into the wall. Increase the pressure gradually, until you are pressing as hard as you can, without shrugging your shoulder or increasing any shoulder discomfort.   Hold for __________ seconds.   Release the tension slowly. Relax your shoulder muscles completely before you begin the next repetition.  Repeat __________ times. Complete this exercise __________ times per day.   STRENGTH - External Rotators, Isometric  Keep your right / left elbow at your side and bend it 90 degrees.   Step into a door frame so that the outside of your  right / left wrist can press against the door frame without your upper arm leaving your side.   Gently press your right / left wrist into the door frame, as if you were trying to swing the back of your hand away from your stomach. Gradually increase the tension, until you are pressing as hard as you can, without shrugging your shoulder or increasing any shoulder discomfort.  Hold for __________ seconds.   Release the tension slowly. Relax your shoulder muscles completely before you begin the next repetition.  Repeat __________ times. Complete this exercise __________ times per day.   STRENGTH - Supraspinatus   Stand or sit with good posture. Grasp a __________ weight, or an exercise band or tubing, so that your hand is "thumbs-up," like you are shaking hands.   Slowly lift your right / left arm in a "V" away from your thigh, diagonally into the space between your side and straight ahead. Lift your hand to shoulder height or as far as you can, without increasing any shoulder pain. At first, many people do not lift their hands above shoulder height.   Avoid shrugging your right / left shoulder as your arm rises, by keeping your shoulder blade tucked down and toward your mid-back spine.   Hold for __________ seconds. Control the descent of your hand, as you slowly return to your starting position.  Repeat __________ times. Complete this exercise __________ times per day.   STRENGTH - External Rotators  Secure a rubber exercise band or tubing to a fixed object (table, pole) so that it is at the same height as your right / left elbow when you are standing or sitting on a firm surface.   Stand or sit so that the secured exercise band is at your uninjured side.   Bend your right / left elbow 90 degrees. Place a folded towel or small pillow under your right / left arm, so that your elbow is a few inches away from your side.   Keeping the tension on the exercise band, pull it away from your body,  as if pivoting on your elbow. Be sure to keep your body steady, so that the movement is coming only from your rotating shoulder.   Hold for __________ seconds. Release the tension in a controlled manner, as you return to the starting position.  Repeat __________ times. Complete this exercise __________ times per day.   STRENGTH - Internal Rotators   Secure a rubber exercise band or tubing to a fixed object (table, pole) so that it is at the same height as your right / left elbow when you are standing or sitting on a firm surface.   Stand or sit so that the secured exercise band is at your right / left side.   Bend your elbow 90 degrees. Place a folded towel or small pillow under your right / left arm so that your elbow is a few inches away from your side.   Keeping the tension on the exercise band, pull it across your body, toward your stomach. Be sure to keep your body steady, so that the movement is coming only from your rotating shoulder.   Hold for __________ seconds. Release the tension in a controlled manner, as you return to the starting position.  Repeat __________ times. Complete this exercise __________ times per day.   STRENGTH - Scapular Protractors, Standing   Stand arms length away from a wall. Place your hands on the wall, keeping your elbows straight.   Begin by dropping your shoulder blades down and toward your mid-back spine.   To strengthen your protractors, keep your shoulder blades down, but slide them forward on your rib cage. It will feel as if you are lifting the back of your rib cage away from the wall. This is a subtle motion and can be challenging to complete. Ask your caregiver for further instruction, if you  are not sure you are doing the exercise correctly.   Hold for __________ seconds. Slowly return to the starting position, resting the muscles completely before starting the next repetition.  Repeat __________ times. Complete this exercise __________ times per  day. STRENGTH - Scapular Protractors, Supine  Lie on your back on a firm surface. Extend your right / left arm straight into the air while holding a __________ weight in your hand.   Keeping your head and back in place, lift your shoulder off the floor.   Hold for __________ seconds. Slowly return to the starting position, and allow your muscles to relax completely before starting the next repetition.  Repeat __________ times. Complete this exercise __________ times per day. STRENGTH - Scapular Protractors, Quadruped  Get onto your hands and knees, with your shoulders directly over your hands (or as close as you can be, comfortably).   Keeping your elbows locked, lift the back of your rib cage up into your shoulder blades, so your mid-back rounds out. Keep your neck muscles relaxed.   Hold this position for __________ seconds. Slowly return to the starting position and allow your muscles to relax completely before starting the next repetition.  Repeat __________ times. Complete this exercise __________ times per day.   STRENGTH - Scapular Retractors  Secure a rubber exercise band or tubing to a fixed object (table, pole), so that it is at the height of your shoulders when you are either standing, or sitting on a firm armless chair.   With a palm down grip, grasp an end of the band in each hand. Straighten your elbows and lift your hands straight in front of you, at shoulder height. Step back, away from the secured end of the band, until it becomes tense.   Squeezing your shoulder blades together, draw your elbows back toward your sides, as you bend them. Keep your upper arms lifted away from your body throughout the exercise.   Hold for __________ seconds. Slowly ease the tension on the band, as you reverse the directions and return to the starting position.  Repeat __________ times. Complete this exercise __________ times per day. STRENGTH - Shoulder Extensors   Secure a rubber exercise  band or tubing to a fixed object (table, pole) so that it is at the height of your shoulders when you are either standing, or sitting on a firm armless chair.   With a thumbs-up grip, grasp an end of the band in each hand. Straighten your elbows and lift your hands straight in front of you, at shoulder height. Step back, away from the secured end of the band, until it becomes tense.   Squeezing your shoulder blades together, pull your hands down to the sides of your thighs. Do not allow your hands to go behind you.   Hold for __________ seconds. Slowly ease the tension on the band, as you reverse the directions and return to the starting position.  Repeat __________ times. Complete this exercise __________ times per day.   STRENGTH - Scapular Retractors and External Rotators   Secure a rubber exercise band or tubing to a fixed object (table, pole) so that it is at the height as your shoulders, when you are either standing, or sitting on a firm armless chair.   With a palm down grip, grasp an end of the band in each hand. Bend your elbows 90 degrees and lift your elbows to shoulder height, at your sides. Step back, away from the secured end of  the band, until it becomes tense.   Squeezing your shoulder blades together, rotate your shoulders so that your upper arms and elbows remain stationary, but your fists travel upward to head height.   Hold for __________ seconds. Slowly ease the tension on the band, as you reverse the directions and return to the starting position.  Repeat __________ times. Complete this exercise __________ times per day.   STRENGTH - Scapular Retractors and External Rotators, Rowing   Secure a rubber exercise band or tubing to a fixed object (table, pole) so that it is at the height of your shoulders, when you are either standing, or sitting on a firm armless chair.   With a palm down grip, grasp an end of the band in each hand. Straighten your elbows and lift your hands  straight in front of you, at shoulder height. Step back, away from the secured end of the band, until it becomes tense.   Step 1: Squeeze your shoulder blades together. Bending your elbows, draw your hands to your chest, as if you are rowing a boat. At the end of this motion, your hands and elbow should be at shoulder height and your elbows should be out to your sides.   Step 2: Rotate your shoulders, to raise your hands above your head. Your forearms should be vertical and your upper arms should be horizontal.   Hold for __________ seconds. Slowly ease the tension on the band, as you reverse the directions and return to the starting position.  Repeat __________ times. Complete this exercise __________ times per day.   STRENGTH  Scapular Depressors  Find a sturdy chair without wheels, such as a dining room chair.   Keeping your feet on the floor, and your hands on the chair arms, lift your bottom up from the seat, and lock your elbows.   Keeping your elbows straight, allow gravity to pull your body weight down. Your shoulders will rise toward your ears.   Raise your body against gravity by drawing your shoulder blades down your back, shortening the distance between your shoulders and ears. Although your feet should always maintain contact with the floor, your feet should progressively support less body weight, as you get stronger.   Hold for __________ seconds. In a controlled and slow manner, lower your body weight to begin the next repetition.  Repeat __________ times. Complete this exercise __________ times per day.   Document Released: 10/18/2005 Document Revised: 01/10/2012 Document Reviewed: 01/30/2009 [facility] Patient Information 2013 [place], [place].

## 2012-08-09 NOTE — Assessment & Plan Note (Signed)
remotely on statin - stopped same due to concern for side effects Lipid profile reviewed - agrees to resume simva 20mg  now due to LDL 193

## 2012-08-09 NOTE — Assessment & Plan Note (Signed)
Chronic symptoms, most pronounced in R hand Progressive intensity, now affecting ADLs (eating, writing) Will start low dose Inderal and refer to movement disorder specialist for eval and tx

## 2012-08-22 ENCOUNTER — Encounter: Payer: Self-pay | Admitting: Neurology

## 2012-08-22 ENCOUNTER — Ambulatory Visit (INDEPENDENT_AMBULATORY_CARE_PROVIDER_SITE_OTHER): Payer: BC Managed Care – PPO | Admitting: Neurology

## 2012-08-22 VITALS — BP 118/72 | HR 64 | Temp 97.7°F | Resp 16 | Wt 118.0 lb

## 2012-08-22 DIAGNOSIS — G250 Essential tremor: Secondary | ICD-10-CM

## 2012-08-22 MED ORDER — PROPRANOLOL HCL ER 60 MG PO CP24: 60.0000 mg | ORAL_CAPSULE | Freq: Two times a day (BID) | ORAL | Status: DC

## 2012-08-22 NOTE — Patient Instructions (Addendum)
1.  Increase propranolol LA to 60 mg twice daily 2.  Call me if you are too tired, dizzy when standing or have trouble exercising 3.  I will see you in 2 weeks on November 5th at 3:00pm.

## 2012-08-22 NOTE — Progress Notes (Addendum)
Subjective:   [name] was seen in consultation in the movement disorder clinic at the request of Rene Paci, MD.  The evaluation is for tremor.  The patient is a 61 y.o. right handed female with a history of tremor.Onset of symptoms was in high school. Tremor primarily involves the bilateral hand.  The tremor is symmetric.   She notices the right hand more than the L.   Symptoms are currently of moderate severity. Tremor exacerbated by stress.  She is not sure if caffeine will affect tremor (she drinks 2 cups of tea per day).  She rarely drinks soda and no coffee in the home.   She has not noted improvement with alcohol.  She has trouble with soup.  She will try to use 2 hands to eat or drink her soup.  She has trouble pouring liquids.    She never notices the tremor at rest.  She has no voice tremor.  She has good balance.  Her mother has tremor as well.  She may have noted a head tremor a few times.  The patient was recently started on 08/09/2012 on Propranolol LA 60 mg daily.  She is not sure if it has helped, but thinks that it may have helped.  She takes it with breakfast.  She initially stated that she had no SE with the medication but then when questioned further she did note that she had trouble getting her HR up with exercise.  Current/Previously tried tremor medications: propranolol LA  Current medications that may exacerbate tremor:  none   Allergies  Allergen Reactions  . Codeine     REACTION: nausea  . Hydrocodone-Acetaminophen   . Morphine And Related Nausea Only    Intense nausea    Current Outpatient Prescriptions on File Prior to Visit  Medication Sig Dispense Refill  . Cholecalciferol (VITAMIN D3) 1000 UNITS CAPS Take by mouth daily.        . DiphenhydrAMINE HCl, Sleep, (SLEEP TABS II PO) Take 1 tablet by mouth at bedtime.      . fish oil-omega-3 fatty acids 1000 MG capsule Take 1 g by mouth daily.      . polyethylene glycol powder (MIRALAX) powder Take 17 g  by mouth 2 (two) times a week.      . simvastatin (ZOCOR) 20 MG tablet Take 1 tablet (20 mg total) by mouth at bedtime.  90 tablet  3  . DISCONTD: propranolol ER (INDERAL LA) 60 MG 24 hr capsule Take 1 capsule (60 mg total) by mouth daily.  30 capsule  2    Past Medical History  Diagnosis Date  . Actinic keratosis   . ALLERGIC RHINITIS   . ANEMIA-NOS   . DEPRESSION   . FIBROIDS, UTERUS   . HYPERLIPIDEMIA   . Sarcoidosis 2009  . OSTEOPENIA 09/2010 DEXA  . OSTEOPOROSIS 2002    on bisphos  . Macular degeneration of both eyes 2012    mild    Past Surgical History  Procedure Date  . Tubal ligation   . Gastric gist removed 2009  . Child birth 63  . Breast surgery     biospy  . Tonsillectomy 1957  . Upper gastrointestinal endoscopy   . Stomach surgery   . Colonoscopy     History   Social History  . Marital Status: Single    Spouse Name: N/A    Number of Children: N/A  . Years of Education: N/A   Occupational History  . 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 Smoking status: Never Smoker   . Smokeless tobacco: Never Used   Comment: Divorced, lives alone. 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 Alcohol Use: 0.0 oz/week    2-4 Glasses of wine per week     social  . Drug Use: No  . Sexually Active: Not on file   Other Topics Concern  . Not on file   Social History Narrative  . No narrative on file    Family Status  Relation Status Death Age  . Mother Alive     colorectal CA  . Father Deceased     hodgkins disease  . Sister Alive     healthy  . Child Alive     healthy    Review of Systems Takes Miralax 2 times per week for constipation.  A complete 10 system ROS was obtained and was negative apart from what is mentioned.   Objective:   VITALS:   Filed Vitals:   08/22/12 1333  BP: 118/72  Pulse: 64  Temp: 97.7 F (36.5 C)  Resp: 16  Weight: 118 lb (53.524 kg)   Gen:  Appears stated age and in NAD. HEENT:  Normocephalic,  atraumatic. The mucous membranes are moist. The superficial temporal arteries are without ropiness or tenderness. Cardiovascular: Regular rate and rhythm. Lungs: Clear to auscultation bilaterally. Neck: There are no carotid bruits noted bilaterally.  NEUROLOGICAL:  Orientation:  The patient is alert and oriented x 3.  Recent and remote memory are intact.  Attention span and concentration are normal.  Able to name objects and repeat without trouble.  Fund of knowledge is appropriate Cranial nerves: There is good facial symmetry. The pupils are equal round and reactive to light bilaterally. Fundoscopic exam reveals clear disc margins bilaterally. Extraocular muscles are intact and visual fields are full to confrontational testing. Speech is fluent and clear. Soft palate rises symmetrically and there is no tongue deviation. Hearing is intact to conversational tone. Tone: Tone is good throughout. Sensation: Sensation is intact to light touch and pinprick throughout (facial, trunk, extremities). Vibration is intact at the bilateral big toe. There is no extinction with double simultaneous stimulation. There is no sensory dermatomal level identified. Coordination:  The patient has no dysdiadichokinesia or dysmetria. Motor: Strength is 5/5 in the bilateral upper and lower extremities.  Shoulder shrug is equal bilaterally.  There is no pronator drift.  There are no fasciculations noted. DTR's: Deep tendon reflexes are 2/4 at the bilateral biceps, triceps, brachioradialis, patella and achilles.  Plantar responses are downgoing bilaterally. Gait and Station: The patient is able to ambulate without difficulty. The patient has minimal difficulty with tandem gait but is able to heel toewalk. The patient is able to stand in the Romberg position.   MOVEMENT EXAM: Tremor:  There is moderate tremor in the RUE, especially noted when I asked her to pour water from one glass to another.  Archimedes spirals are drawn  fairly well.  She has a mild tremor of the left hand.  With distraction techniques, she will have a resting component to the RUE tremor.  There is an intermittent tremor, very mild in the head.    LABS:  Lab Results  Component Value Date   TSH 2.39 08/02/2012     Chemistry      Component Value Date/Time   NA 141 08/02/2012 0959   K 5.2* 08/02/2012 0959   CL 105 08/02/2012 0959  CO2 29 08/02/2012 0959   BUN 12 08/02/2012 0959   CREATININE 0.8 08/02/2012 0959      Component Value Date/Time   CALCIUM 9.5 08/02/2012 0959   ALKPHOS 51 08/02/2012 0959   AST 24 08/02/2012 0959   ALT 19 08/02/2012 0959   BILITOT 0.8 08/02/2012 0959       Assessment:   1.  Essential tremor.   This is evidenced by longstanding history and most evident with activation and some with intention.  There is also a fam hx of tremor in her mother.   She has a mild resting component on the R but this can be seen in longstanding ET.  She has no bradykinesia.    Plan:   1.  I will very cautiously increase her propranolol to bid dosing.  I am worried that this will cause bradycardia and I discussed this with her today.  I talked to her about s/s of bradycardia and OH.  Risks, benefits, side effects and alternative therapies were discussed.  The opportunity to ask questions was given and they were answered to the best of my ability.  The patient expressed understanding and willingness to follow the outlined treatment protocols. 2.  She is going to f/u with me in 2 weeks.  If she has symptommatic bradycardia, then I will change her to primidone. 3.  Pt education was provided.  Greater than 50% of this visit was spent in counseling and education with the patient today.  Visit time was 45 min.

## 2012-08-25 ENCOUNTER — Telehealth: Payer: Self-pay

## 2012-08-25 NOTE — Telephone Encounter (Signed)
Pt calling saying the increased inderal is making her very tired and that you wanted her to call if it did in order to try other options.

## 2012-08-28 NOTE — Telephone Encounter (Signed)
Tell her to go back to previous dosing, make sure that she has a f/u in a week or two and we will likely change medication all together.  Thank her for letting me know.

## 2012-08-29 NOTE — Telephone Encounter (Signed)
Patient returned my call. She reports that she is taking the Inderal every am and the PM dose every other night and states that has really helped a lot. She says [facility] She is aware of her f/u appointment with Dr. Arbutus Leas on 11/5 at 3 PM. No other questions or concerns at this time.

## 2012-08-29 NOTE — Telephone Encounter (Signed)
Left a message for the patient to return my call.  

## 2012-09-05 ENCOUNTER — Ambulatory Visit (INDEPENDENT_AMBULATORY_CARE_PROVIDER_SITE_OTHER): Payer: BC Managed Care – PPO | Admitting: Neurology

## 2012-09-05 ENCOUNTER — Encounter: Payer: Self-pay | Admitting: Neurology

## 2012-09-05 VITALS — BP 102/70 | HR 60 | Temp 97.4°F | Resp 16 | Wt 120.0 lb

## 2012-09-05 DIAGNOSIS — G250 Essential tremor: Secondary | ICD-10-CM

## 2012-09-05 MED ORDER — PRIMIDONE 50 MG PO TABS: ORAL_TABLET | ORAL | Status: DC

## 2012-09-05 NOTE — Progress Notes (Signed)
Subjective:   [name] was seen in f/u in the movement disorder clinic at the request of Rene Paci, MD.  The f/u is for essential tremor.  The patient is a 61 y.o. right handed female with a history of tremor.Onset of symptoms was in high school. Tremor primarily involves the bilateral hand.  The tremor is symmetric.   She notices the right hand more than the L.   Symptoms are currently of moderate severity. Tremor exacerbated by stress.  She is not sure if caffeine will affect tremor (she drinks 2 cups of tea per day).  She rarely drinks soda and no coffee in the home.   She has not noted improvement with alcohol.  She has trouble with soup.  She will try to use 2 hands to eat or drink her soup.  She has trouble pouring liquids.    She never notices the tremor at rest.  She has no voice tremor.  She has good balance.  Her mother has tremor as well.  She may have noted a head tremor a few times.  The patient was recently started on 08/09/2012 on Propranolol LA 60 mg daily.  Last visit, we tried to increase the dose to twice a day dosing but she had significant fatigue with that.  She is not taking it twice a day every other day and on the opposite day she is just taking it once a day.  She thinks that it has helped some but not significantly.  She states that she quit working out and has really had poor motivation to get out of the house.  She is not sure that it is from the medication.  She states she has not been eating right lately either.  She has gained a few pounds.  Current/Previously tried tremor medications: propranolol LA  Current medications that may exacerbate tremor:  none   Allergies  Allergen Reactions  . Codeine     REACTION: nausea  . Hydrocodone-Acetaminophen   . Morphine And Related Nausea Only    Intense nausea    Current Outpatient Prescriptions on File Prior to Visit  Medication Sig Dispense Refill  . Cholecalciferol (VITAMIN D3) 1000 UNITS CAPS Take by  mouth daily.        . DiphenhydrAMINE HCl, Sleep, (SLEEP TABS II PO) Take 1 tablet by mouth at bedtime.      . fish oil-omega-3 fatty acids 1000 MG capsule Take 1 g by mouth daily.      . polyethylene glycol powder (MIRALAX) powder Take 17 g by mouth 2 (two) times a week.      . propranolol ER (INDERAL LA) 60 MG 24 hr capsule Take 1 capsule (60 mg total) by mouth 2 (two) times daily.  60 capsule  2  . simvastatin (ZOCOR) 20 MG tablet Take 1 tablet (20 mg total) by mouth at bedtime.  90 tablet  3  . primidone (MYSOLINE) 50 MG tablet 1/2 po q hs x 1week, then 1 po q hs  30 tablet  3    Past Medical History  Diagnosis Date  . Actinic keratosis   . ALLERGIC RHINITIS   . ANEMIA-NOS   . DEPRESSION   . FIBROIDS, UTERUS   . HYPERLIPIDEMIA   . Sarcoidosis 2009  . OSTEOPENIA 09/2010 DEXA  . OSTEOPOROSIS 2002    on bisphos  . Macular degeneration of both eyes 2012    mild    Past Surgical History  Procedure Date  . Tubal  ligation   . Gastric gist removed 2009  . Child birth 85  . Breast surgery     biospy  . Tonsillectomy 1957  . Upper gastrointestinal endoscopy   . Stomach surgery   . Colonoscopy     History   Social History  . Marital Status: Single    Spouse Name: N/A    Number of Children: N/A  . Years of Education: N/A   Occupational History  . retired     Stage manager   Social History Main Topics  . Smoking status: Never Smoker   . Smokeless tobacco: Never Used     Comment: Divorced, lives alone. 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 Alcohol Use: 0.0 oz/week    2-4 Glasses of wine per week     Comment: social  . Drug Use: No  . Sexually Active: Not on file   Other Topics Concern  . Not on file   Social History Narrative  . No narrative on file    Family Status  Relation Status Death Age  . Mother Alive     colorectal CA  . Father Deceased     hodgkins disease  . Sister Alive     healthy  . Child Alive     healthy    Review of Systems Takes  Miralax 2 times per week for constipation.  A complete 10 system ROS was obtained and was negative apart from what is mentioned.   Objective:   VITALS:   Filed Vitals:   09/05/12 1503  BP: 102/70  Pulse: 60  Temp: 97.4 F (36.3 C)  Resp: 16  Weight: 120 lb (54.432 kg)   Gen:  Appears stated age and in NAD. HEENT:  Normocephalic, atraumatic. The mucous membranes are moist. The superficial temporal arteries are without ropiness or tenderness. Cardiovascular: Regular rate and rhythm. Lungs: Clear to auscultation bilaterally. Neck: There are no carotid bruits noted bilaterally.  NEUROLOGICAL:  Orientation:  The patient is alert and oriented x 3.  Recent and remote memory are intact.  Attention span and concentration are normal.  Able to name objects and repeat without trouble.  Fund of knowledge is appropriate Cranial nerves: There is good facial symmetry. The pupils are equal round and reactive to light bilaterally. Fundoscopic exam reveals clear disc margins bilaterally. Extraocular muscles are intact and visual fields are full to confrontational testing. Speech is fluent and clear. Soft palate rises symmetrically and there is no tongue deviation. Hearing is intact to conversational tone. Tone: Tone is good throughout. Sensation: Sensation is intact to light touch and pinprick throughout (facial, trunk, extremities).  Coordination:  The patient has no dysdiadichokinesia or dysmetria. Motor: Strength is 5/5 in the bilateral upper and lower extremities.  Shoulder shrug is equal bilaterally.  There is no pronator drift.  There are no fasciculations noted. DTR's: Deep tendon reflexes are 2/4 at the bilateral biceps, triceps, brachioradialis, patella and achilles.  Plantar responses are downgoing bilaterally. Gait and Station: The patient is able to ambulate without difficulty. The patient has minimal difficulty with tandem gait but is able to heel toewalk. The patient is able to stand in the  Romberg position.   MOVEMENT EXAM: Tremor:  There is moderate tremor in the RUE, especially noted when I asked her to pour water from one glass to another.  This is actually better than last visit. She has a mild tremor of the left hand.  With distraction techniques, she will have a resting  component to the RUE tremor.  There is an intermittent tremor, very mild in the head.    LABS:  Lab Results  Component Value Date   TSH 2.39 08/02/2012     Chemistry      Component Value Date/Time   NA 141 08/02/2012 0959   K 5.2* 08/02/2012 0959   CL 105 08/02/2012 0959   CO2 29 08/02/2012 0959   BUN 12 08/02/2012 0959   CREATININE 0.8 08/02/2012 0959      Component Value Date/Time   CALCIUM 9.5 08/02/2012 0959   ALKPHOS 51 08/02/2012 0959   AST 24 08/02/2012 0959   ALT 19 08/02/2012 0959   BILITOT 0.8 08/02/2012 0959       Assessment:   1.  Essential tremor.   This is evidenced by longstanding history and most evident with activation and some with intention.  There is also a fam hx of tremor in her mother.   She has a mild resting component on the R but this can be seen in longstanding ET.  She has no bradykinesia.  She has been unable to tolerate effective dosages of Inderal.    Plan:   1. we will slowly start primidone.  Her propranolol will be discontinued.  Risks, benefits, side effects and alternative therapies were discussed.  The opportunity to ask questions was given and they were answered to the best of my ability.  The patient expressed understanding and willingness to follow the outlined treatment protocols. 2.  She is going to f/u with me in 8 weeks.  I encouraged her to get back to the gym and I encouraged proper eating.  If the propranolol was causing the fatigue and lack of motivation, discontinuing it should be of significant value. 3.  Time in room: 25 min

## 2012-09-05 NOTE — Patient Instructions (Signed)
1.  Call me if you have any problems with the medication or feel like perhaps we need a higher dosage

## 2012-09-07 ENCOUNTER — Telehealth: Payer: Self-pay | Admitting: Neurology

## 2012-09-07 NOTE — Telephone Encounter (Signed)
Picked up an incoming call from the patient, [name]. She reports that she started the Primidone 50 mg 1/2 tab last night at bedtime as directed by Dr. Arbutus Leas. She states that when she got up this morning, she felt a little dizzy and off balance. Now she reports being nauseated and has a HA. She said Dr. Arbutus Leas told her she may feel bad with the first dose, but she wants to know if this is normal and should she continue the medication as directed. She doesn't think she will be able to tolerate feeling like this for long. I told her that I would check with Dr. Arbutus Leas and get back with her regarding her symptoms. The patient is ok with this plan.  **Dr. Arbutus Leas, please advise.

## 2012-09-07 NOTE — Telephone Encounter (Signed)
Yes, it sounds like this could be "the first dose effect" that I told her about.  It will not continue.  Tell her to continue with the 1/2 pill at night if she can and don't go up to full pill for a week.  If she still feels bad, then have her stop it for a few days to make sure it IS medication and that its not just that she is coming down with something.  Have her let us know in a week how she is doing.

## 2012-09-07 NOTE — Telephone Encounter (Signed)
Called and spoke with [name]. She reports that she was feeling a little better but by no means back to baseline. Information given as per Dr. Arbutus Leas below. The patient will continue with the med as prescribed and call with an update in a few days or sooner if need be.

## 2012-09-08 NOTE — Telephone Encounter (Signed)
Picked up a call from the patient. She called to report that she is still not feeling well this morning. States at bedtime last night she only took "a half of a half" of a pill; nausea not as bad but still c/o HA and "active mental activity all night long". She reports that she is going to cancel some of her weekend plans as she just doesn't feel well. The patient states her tremor is not worth feeling this way. I told her I would let Dr. Arbutus Leas know that we had talked and get back to her with her recommendations. She is fine with this plan. *Dr. Arbutus Leas, please advise next step. Thanks.

## 2012-09-08 NOTE — Telephone Encounter (Signed)
Called and spoke with the patient. She reports that she is feeling a little "more human" since eating a sandwich and drinking some ginger ale. Instructions given as per Dr. Arbutus Leas below. She will call us on Monday with an update. No other issues voiced at this time.

## 2012-09-08 NOTE — Telephone Encounter (Signed)
Have her hold her primidone for the weekend and then call me Monday with how she feels.

## 2012-09-11 NOTE — Telephone Encounter (Signed)
Pt called back, she is feeling completely normal now, 100% better than she was.  She will call back if her tremor gets worse in the future.

## 2012-09-11 NOTE — Telephone Encounter (Signed)
Left a message for the patient to return my call.  

## 2012-09-19 ENCOUNTER — Telehealth: Payer: Self-pay

## 2012-09-19 NOTE — Telephone Encounter (Signed)
Ok. thx for message

## 2012-09-19 NOTE — Telephone Encounter (Signed)
Pt called to inform MD that her eye doctor recommended that she start a supplement for macular degeneration - Arrets 1.

## 2012-11-01 DIAGNOSIS — H269 Unspecified cataract: Secondary | ICD-10-CM

## 2012-11-01 HISTORY — DX: Unspecified cataract: H26.9

## 2012-11-22 LAB — HM MAMMOGRAPHY

## 2012-11-23 ENCOUNTER — Encounter: Payer: Self-pay | Admitting: Internal Medicine

## 2013-07-02 HISTORY — PX: CATARACT EXTRACTION: SUR2

## 2013-07-30 ENCOUNTER — Encounter: Payer: Self-pay | Admitting: Internal Medicine

## 2013-08-04 ENCOUNTER — Other Ambulatory Visit: Payer: Self-pay | Admitting: Internal Medicine

## 2013-10-03 ENCOUNTER — Ambulatory Visit (INDEPENDENT_AMBULATORY_CARE_PROVIDER_SITE_OTHER): Payer: BC Managed Care – PPO | Admitting: Internal Medicine

## 2013-10-03 ENCOUNTER — Encounter: Payer: Self-pay | Admitting: Internal Medicine

## 2013-10-03 VITALS — BP 120/72 | HR 77 | Temp 98.1°F | Ht 64.0 in | Wt 120.0 lb

## 2013-10-03 DIAGNOSIS — Z0000 Encounter for general adult medical examination without abnormal findings: Secondary | ICD-10-CM

## 2013-10-03 DIAGNOSIS — Z136 Encounter for screening for cardiovascular disorders: Secondary | ICD-10-CM

## 2013-10-03 DIAGNOSIS — E785 Hyperlipidemia, unspecified: Secondary | ICD-10-CM

## 2013-10-03 DIAGNOSIS — G250 Essential tremor: Secondary | ICD-10-CM

## 2013-10-03 DIAGNOSIS — M810 Age-related osteoporosis without current pathological fracture: Secondary | ICD-10-CM

## 2013-10-03 NOTE — Assessment & Plan Note (Signed)
DEXA followed by gyn - reports up to date On bisphos + Ca+D for same Continue WB exercise

## 2013-10-03 NOTE — Assessment & Plan Note (Signed)
resumed simva 20mg  qhs due to LDL 193 in 08/2012 Recheck annually and adjust as needed

## 2013-10-03 NOTE — Progress Notes (Signed)
Pre-visit discussion using our clinic review tool. No additional management support is needed unless otherwise documented below in the visit note.  

## 2013-10-03 NOTE — Assessment & Plan Note (Signed)
Chronic symptoms, most pronounced in R hand, also voice/head/neck eval by Dr Tat 2014 - intolerant of side effects of Inderal and primidone Feels dz not as bad as tx side effects -  Will continue observation only, follow up neuro as needed

## 2013-10-03 NOTE — Patient Instructions (Addendum)
It was good to see you today.  We have reviewed your prior records including labs and tests today  Health Maintenance reviewed - all recommended immunizations and age-appropriate screenings are up-to-date.  Please send me a message on My Chart about the dates of your last bone density scan and Pap smear  Test(s) ordered today. Please return when you are fasting for these labs. Your results will be released to MyChart (or called to you) after review, usually within 72hours after test completion. If any changes need to be made, you will be notified at that same time.  Medications reviewed and updated, no changes recommended at this time.  Please schedule followup in 12 months for annual exam/labs, call sooner if problems.  Health Maintenance, Female A healthy lifestyle and preventative care can promote health and wellness.  Maintain regular health, dental, and eye exams.  Eat a healthy diet. Foods like vegetables, fruits, whole grains, low-fat dairy products, and lean protein foods contain the nutrients you need without too many calories. Decrease your intake of foods high in solid fats, added sugars, and salt. Get information about a proper diet from your caregiver, if necessary.  Regular physical exercise is one of the most important things you can do for your health. Most adults should get at least 150 minutes of moderate-intensity exercise (any activity that increases your heart rate and causes you to sweat) each week. In addition, most adults need muscle-strengthening exercises on 2 or more days a week.   Maintain a healthy weight. The body mass index (BMI) is a screening tool to identify possible weight problems. It provides an estimate of body fat based on height and weight. Your caregiver can help determine your BMI, and can help you achieve or maintain a healthy weight. For adults 20 years and older:  A BMI below 18.5 is considered underweight.  A BMI of 18.5 to 24.9 is  normal.  A BMI of 25 to 29.9 is considered overweight.  A BMI of 30 and above is considered obese.  Maintain normal blood lipids and cholesterol by exercising and minimizing your intake of saturated fat. Eat a balanced diet with plenty of fruits and vegetables. Blood tests for lipids and cholesterol should begin at age 89 and be repeated every 5 years. If your lipid or cholesterol levels are high, you are over 50, or you are a high risk for heart disease, you may need your cholesterol levels checked more frequently.Ongoing high lipid and cholesterol levels should be treated with medicines if diet and exercise are not effective.  If you smoke, find out from your caregiver how to quit. If you do not use tobacco, do not start.  Lung cancer screening is recommended for adults aged 23 80 years who are at high risk for developing lung cancer because of a history of smoking. Yearly low-dose computed tomography (CT) is recommended for people who have at least a 30-pack-year history of smoking and are a current smoker or have quit within the past 15 years. A pack year of smoking is smoking an average of 1 pack of cigarettes a day for 1 year (for example: 1 pack a day for 30 years or 2 packs a day for 15 years). Yearly screening should continue until the smoker has stopped smoking for at least 15 years. Yearly screening should also be stopped for people who develop a health problem that would prevent them from having lung cancer treatment.  If you are pregnant, do not drink alcohol. If  you are breastfeeding, be very cautious about drinking alcohol. If you are not pregnant and choose to drink alcohol, do not exceed 1 drink per day. One drink is considered to be 12 ounces (355 mL) of beer, 5 ounces (148 mL) of wine, or 1.5 ounces (44 mL) of liquor.  Avoid use of street drugs. Do not share needles with anyone. Ask for help if you need support or instructions about stopping the use of drugs.  High blood pressure  causes heart disease and increases the risk of stroke. Blood pressure should be checked at least every 1 to 2 years. Ongoing high blood pressure should be treated with medicines, if weight loss and exercise are not effective.  If you are 20 to 62 years old, ask your caregiver if you should take aspirin to prevent strokes.  Diabetes screening involves taking a blood sample to check your fasting blood sugar level. This should be done once every 3 years, after age 95, if you are within normal weight and without risk factors for diabetes. Testing should be considered at a younger age or be carried out more frequently if you are overweight and have at least 1 risk factor for diabetes.  Breast cancer screening is essential preventative care for women. You should practice "breast self-awareness." This means understanding the normal appearance and feel of your breasts and may include breast self-examination. Any changes detected, no matter how small, should be reported to a caregiver. Women in their 21s and 30s should have a clinical breast exam (CBE) by a caregiver as part of a regular health exam every 1 to 3 years. After age 31, women should have a CBE every year. Starting at age 70, women should consider having a mammogram (breast X-ray) every year. Women who have a family history of breast cancer should talk to their caregiver about genetic screening. Women at a high risk of breast cancer should talk to their caregiver about having an MRI and a mammogram every year.  Breast cancer gene (BRCA)-related cancer risk assessment is recommended for women who have family members with BRCA-related cancers. BRCA-related cancers include breast, ovarian, tubal, and peritoneal cancers. Having family members with these cancers may be associated with an increased risk for harmful changes (mutations) in the breast cancer genes BRCA1 and BRCA2. Results of the assessment will determine the need for genetic counseling and BRCA1  and BRCA2 testing.  The Pap test is a screening test for cervical cancer. Women should have a Pap test starting at age 49. Between ages 89 and 2, Pap tests should be repeated every 2 years. Beginning at age 62, you should have a Pap test every 3 years as long as the past 3 Pap tests have been normal. If you had a hysterectomy for a problem that was not cancer or a condition that could lead to cancer, then you no longer need Pap tests. If you are between ages 45 and 43, and you have had normal Pap tests going back 10 years, you no longer need Pap tests. If you have had past treatment for cervical cancer or a condition that could lead to cancer, you need Pap tests and screening for cancer for at least 20 years after your treatment. If Pap tests have been discontinued, risk factors (such as a new sexual partner) need to be reassessed to determine if screening should be resumed. Some women have medical problems that increase the chance of getting cervical cancer. In these cases, your caregiver may recommend more  frequent screening and Pap tests.  The human papillomavirus (HPV) test is an additional test that may be used for cervical cancer screening. The HPV test looks for the virus that can cause the cell changes on the cervix. The cells collected during the Pap test can be tested for HPV. The HPV test could be used to screen women aged 70 years and older, and should be used in women of any age who have unclear Pap test results. After the age of 54, women should have HPV testing at the same frequency as a Pap test.  Colorectal cancer can be detected and often prevented. Most routine colorectal cancer screening begins at the age of 26 and continues through age 63. However, your caregiver may recommend screening at an earlier age if you have risk factors for colon cancer. On a yearly basis, your caregiver may provide home test kits to check for hidden blood in the stool. Use of a small camera at the end of a tube,  to directly examine the colon (sigmoidoscopy or colonoscopy), can detect the earliest forms of colorectal cancer. Talk to your caregiver about this at age 83, when routine screening begins. Direct examination of the colon should be repeated every 5 to 10 years through age 67, unless early forms of pre-cancerous polyps or small growths are found.  Hepatitis C blood testing is recommended for all people born from [birth year] through 1965 and any individual with known risks for hepatitis C.  Practice safe sex. Use condoms and avoid high-risk sexual practices to reduce the spread of sexually transmitted infections (STIs). Sexually active women aged 79 and younger should be checked for Chlamydia, which is a common sexually transmitted infection. Older women with new or multiple partners should also be tested for Chlamydia. Testing for other STIs is recommended if you are sexually active and at increased risk.  Osteoporosis is a disease in which the bones lose minerals and strength with aging. This can result in serious bone fractures. The risk of osteoporosis can be identified using a bone density scan. Women ages 36 and over and women at risk for fractures or osteoporosis should discuss screening with their caregivers. Ask your caregiver whether you should be taking a calcium supplement or vitamin D to reduce the rate of osteoporosis.  Menopause can be associated with physical symptoms and risks. Hormone replacement therapy is available to decrease symptoms and risks. You should talk to your caregiver about whether hormone replacement therapy is right for you.  Use sunscreen. Apply sunscreen liberally and repeatedly throughout the day. You should seek shade when your shadow is shorter than you. Protect yourself by wearing long sleeves, pants, a wide-brimmed hat, and sunglasses year round, whenever you are outdoors.  Notify your caregiver of new moles or changes in moles, especially if there is a change in shape or  color. Also notify your caregiver if a mole is larger than the size of a pencil eraser.  Stay current with your immunizations. Document Released: 05/03/2011 Document Revised: 02/12/2013 Document Reviewed: 05/03/2011 [facility] Patient Information 2014 [place], [place].

## 2013-10-03 NOTE — Progress Notes (Signed)
Subjective:    Patient ID: [name], female    DOB: 10-01-1951, 62 y.o.   MRN: [id number]  HPI  patient is here today for annual physical. Patient feels well overall.  Also reviewed chronic medical issues and interval medical events   Past Medical History  Diagnosis Date  . Actinic keratosis   . ALLERGIC RHINITIS   . ANEMIA-NOS   . DEPRESSION   . FIBROIDS, UTERUS   . HYPERLIPIDEMIA   . Sarcoidosis 2009  . OSTEOPENIA 09/2010 DEXA  . OSTEOPOROSIS 2002    on bisphos  . Macular degeneration of both eyes 2012    mild   Family History  Problem Relation Age of Onset  . Allergies Mother   . Colon cancer Mother   . Allergies Sister   . Allergies Daughter   . Breast cancer Maternal Aunt   . Prostate cancer Maternal Uncle   . Esophageal cancer Maternal Grandfather   . Stomach cancer Neg Hx    History  Substance Use Topics  . Smoking status: Never Smoker   . Smokeless tobacco: Never Used     Comment: Divorced, lives alone. 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 Alcohol Use: 0.0 oz/week    2-4 Glasses of wine per week     Comment: social    Review of Systems  Constitutional: Negative for fatigue and unexpected weight change.  Respiratory: Negative for cough, shortness of breath and wheezing.   Cardiovascular: Negative for chest pain, palpitations and leg swelling.  Gastrointestinal: Negative for nausea, abdominal pain and diarrhea.  Neurological: Positive for tremors (chronic R UE). Negative for dizziness, weakness, light-headedness and headaches.  Psychiatric/Behavioral: Negative for dysphoric mood. The patient is not nervous/anxious.   All other systems reviewed and are negative.       Objective:   Physical Exam  BP 120/72  Pulse 77  Temp(Src) 98.1 F (36.7 C) (Oral)  Ht 5\' 4"  (1.626 m)  Wt 120 lb (54.432 kg)  BMI 20.59 kg/m2  SpO2 96% Wt Readings from Last 3 Encounters:  10/03/13 120 lb (54.432 kg)  09/05/12 120 lb (54.432 kg)  08/22/12 118 lb (53.524  kg)   Constitutional: She appears well-developed and well-nourished. No distress.  HENT: Head: Normocephalic and atraumatic. Ears: B TMs ok, no erythema or effusion; Nose: Nose normal. Mouth/Throat: Oropharynx is clear and moist. No oropharyngeal exudate.  Eyes: Conjunctivae and EOM are normal. Pupils are equal, round, and reactive to light. No scleral icterus.  Neck: Normal range of motion. Neck supple. No JVD present. No thyromegaly present.  Cardiovascular: Normal rate, regular rhythm and normal heart sounds.  No murmur heard. No BLE edema. Pulmonary/Chest: Effort normal and breath sounds normal. No respiratory distress. She has no wheezes.  Abdominal: Soft. Bowel sounds are normal. She exhibits no distension. There is no tenderness. no masses Musculoskeletal: no gross deformities or joint effusions Neurological: Marked resting tremor of right upper extremity greater than left upper extremity. No head neck or voice tremor. She is alert and oriented to person, place, and time. No cranial nerve deficit. Coordination, speech, balance and gait are normal.  Skin: Skin is warm and dry. No rash noted. No erythema.  Psychiatric: She has a normal mood and affect. Her behavior is normal. Judgment and thought content normal.   Lab Results  Component Value Date   WBC 5.4 08/02/2012   HGB 15.4* 08/02/2012   HCT 46.3* 08/02/2012   PLT 273.0 08/02/2012   GLUCOSE 96 08/02/2012  CHOL 264* 08/02/2012   TRIG 95.0 08/02/2012   HDL 45.70 08/02/2012   LDLDIRECT 193.5 08/02/2012   LDLCALC 124* 11/25/2010   ALT 19 08/02/2012   AST 24 08/02/2012   NA 141 08/02/2012   K 5.2* 08/02/2012   CL 105 08/02/2012   CREATININE 0.8 08/02/2012   BUN 12 08/02/2012   CO2 29 08/02/2012   TSH 2.39 08/02/2012   ECG: sinus at 80 beats per minute. Occasional ectopic ventricular beat, no ischemia or arrhythmia     Assessment & Plan:  CPX/v70.0 - Patient has been counseled on age-appropriate routine health concerns for screening and  prevention. These are reviewed and up-to-date. Immunizations are up-to-date or declined. Labs reviewed.  Also See problem list. Medications and labs reviewed today.

## 2013-10-04 ENCOUNTER — Encounter: Payer: Self-pay | Admitting: Internal Medicine

## 2013-10-05 ENCOUNTER — Other Ambulatory Visit (INDEPENDENT_AMBULATORY_CARE_PROVIDER_SITE_OTHER): Payer: BC Managed Care – PPO

## 2013-10-05 ENCOUNTER — Encounter: Payer: Self-pay | Admitting: Internal Medicine

## 2013-10-05 DIAGNOSIS — Z0000 Encounter for general adult medical examination without abnormal findings: Secondary | ICD-10-CM

## 2013-10-05 LAB — CBC WITH DIFFERENTIAL/PLATELET
Basophils Absolute: 0.1 10*3/uL (ref 0.0–0.1)
Eosinophils Absolute: 0.3 10*3/uL (ref 0.0–0.7)
Hemoglobin: 15.6 g/dL — ABNORMAL HIGH (ref 12.0–15.0)
Lymphocytes Relative: 30.9 % (ref 12.0–46.0)
MCHC: 33.5 g/dL (ref 30.0–36.0)
MCV: 92.2 fl (ref 78.0–100.0)
Monocytes Absolute: 0.5 10*3/uL (ref 0.1–1.0)
Monocytes Relative: 5.9 % (ref 3.0–12.0)
Neutro Abs: 4.6 10*3/uL (ref 1.4–7.7)
Platelets: 278 10*3/uL (ref 150.0–400.0)
RDW: 13.5 % (ref 11.5–14.6)

## 2013-10-05 LAB — BASIC METABOLIC PANEL
BUN: 9 mg/dL (ref 6–23)
CO2: 30 mEq/L (ref 19–32)
Calcium: 9.6 mg/dL (ref 8.4–10.5)
Creatinine, Ser: 0.7 mg/dL (ref 0.4–1.2)
GFR: 87.13 mL/min (ref >60.00)
Glucose, Bld: 84 mg/dL (ref 70–99)
Sodium: 140 mEq/L (ref 135–145)

## 2013-10-05 LAB — URINALYSIS, ROUTINE W REFLEX MICROSCOPIC
Bilirubin Urine: NEGATIVE
Hgb urine dipstick: NEGATIVE
Ketones, ur: NEGATIVE
Leukocytes, UA: NEGATIVE
Specific Gravity, Urine: 1.015 (ref 1.000–1.030)
Urine Glucose: NEGATIVE
Urobilinogen, UA: 0.2 (ref 0.0–1.0)

## 2013-10-05 LAB — HEPATIC FUNCTION PANEL
AST: 39 U/L — ABNORMAL HIGH (ref 0–37)
Alkaline Phosphatase: 47 U/L (ref 39–117)
Total Bilirubin: 0.8 mg/dL (ref 0.3–1.2)

## 2013-10-05 LAB — TSH: TSH: 2.42 u[IU]/mL (ref 0.35–5.50)

## 2013-10-05 LAB — LIPID PANEL
Cholesterol: 161 mg/dL (ref 0–200)
Total CHOL/HDL Ratio: 4

## 2013-10-16 ENCOUNTER — Encounter: Payer: Self-pay | Admitting: Internal Medicine

## 2013-11-06 ENCOUNTER — Other Ambulatory Visit: Payer: Self-pay | Admitting: Internal Medicine

## 2013-12-12 ENCOUNTER — Ambulatory Visit (INDEPENDENT_AMBULATORY_CARE_PROVIDER_SITE_OTHER): Payer: BC Managed Care – PPO | Admitting: Internal Medicine

## 2013-12-12 ENCOUNTER — Encounter: Payer: Self-pay | Admitting: Internal Medicine

## 2013-12-12 VITALS — BP 102/72 | HR 103 | Temp 99.3°F | Wt 120.4 lb

## 2013-12-12 DIAGNOSIS — B9789 Other viral agents as the cause of diseases classified elsewhere: Principal | ICD-10-CM

## 2013-12-12 DIAGNOSIS — J069 Acute upper respiratory infection, unspecified: Secondary | ICD-10-CM

## 2013-12-12 MED ORDER — PROMETHAZINE-PHENYLEPHRINE 6.25-5 MG/5ML PO SYRP: 5.0000 mL | ORAL_SOLUTION | ORAL | Status: DC | PRN

## 2013-12-12 NOTE — Progress Notes (Signed)
  Subjective:   HPI  complains of cough and cold symptoms  Onset 4 days ago, unchanged symptoms symptoms, but worse at night  Initially associated with rhinorrhea, sore throat, mild headache and low grade fever Now productive cough with mild head congestion, symptoms worse at night No relief with OTC meds -Alka-Seltzer and Emergen-C Precipitated by sick contacts (g-son)  Past Medical History  Diagnosis Date  . Actinic keratosis   . ALLERGIC RHINITIS   . ANEMIA-NOS   . DEPRESSION   . FIBROIDS, UTERUS   . HYPERLIPIDEMIA   . Sarcoidosis 2009  . OSTEOPOROSIS 2002    on bisphos  . Macular degeneration of both eyes 2012    mild    Review of Systems Constitutional: No unexpected weight change Pulmonary: No pleurisy or hemoptysis Cardiovascular: No chest pain or palpitations     Objective:   Physical Exam BP 102/72  Pulse 103  Temp(Src) 99.3 F (37.4 C) (Oral)  Wt 120 lb 6.4 oz (54.613 kg)  SpO2 94% GEN: mildly ill appearing and audible head congestion HENT: NCAT, mild sinus tenderness bilaterally, nares with clear discharge, mild turbinate swelling with erythema, oropharynx mild erythema with clear PND, no exudate Eyes: Vision grossly intact, no conjunctivitis Lungs: clear to auscultation bilaterally. no wheeze, no increased work of breathing Cardiovascular: Regular rate and rhythm, no bilateral edema  Lab Results  Component Value Date   WBC 7.9 10/05/2013   HGB 15.6* 10/05/2013   HCT 46.5* 10/05/2013   PLT 278.0 10/05/2013   GLUCOSE 84 10/05/2013   CHOL 161 10/05/2013   TRIG 92.0 10/05/2013   HDL 43.10 10/05/2013   LDLDIRECT 193.5 08/02/2012   LDLCALC 100* 10/05/2013   ALT 39* 10/05/2013   AST 39* 10/05/2013   NA 140 10/05/2013   K 3.7 10/05/2013   CL 101 10/05/2013   CREATININE 0.7 10/05/2013   BUN 9 10/05/2013   CO2 30 10/05/2013   TSH 2.42 10/05/2013       Assessment & Plan:  Viral URI >progression to sinusitis  Cough, postnasal drip related to above   Explained  lack of efficacy for antibiotics in viral disease -but pt to call if worsening symptoms or symptoms duration greater than 7 days  Prescription cough with decongestant suppression - new prescriptions done Symptomatic care with Tylenol or Advil, hydration and rest -  salt gargle advised as needed

## 2013-12-12 NOTE — Patient Instructions (Addendum)
It was good to see you today.  If you develop worsening symptoms or fever, call and we can reconsider antibiotics, but it does not appear necessary to use antibiotics at this time.  prescription cough syrup with decongestant - Your prescription(s) have been submitted to your pharmacy. Please take as directed and contact our office if you believe you are having problem(s) with the medication(s).  Alternate between ibuprofen and tylenol for aches, pain and fever symptoms as discussed  Hydrate, rest and call if worse or unimproved  Upper Respiratory Infection, Adult An upper respiratory infection (URI) is also sometimes known as the common cold. The upper respiratory tract includes the nose, sinuses, throat, trachea, and bronchi. Bronchi are the airways leading to the lungs. Most people improve within 1 week, but symptoms can last up to 2 weeks. A residual cough may last even longer.  CAUSES Many different viruses can infect the tissues lining the upper respiratory tract. The tissues become irritated and inflamed and often become very moist. Mucus production is also common. A cold is contagious. You can easily spread the virus to others by oral contact. This includes kissing, sharing a glass, coughing, or sneezing. Touching your mouth or nose and then touching a surface, which is then touched by another person, can also spread the virus. SYMPTOMS  Symptoms typically develop 1 to 3 days after you come in contact with a cold virus. Symptoms vary from person to person. They may include:  Runny nose.  Sneezing.  Nasal congestion.  Sinus irritation.  Sore throat.  Loss of voice (laryngitis).  Cough.  Fatigue.  Muscle aches.  Loss of appetite.  Headache.  Low-grade fever. DIAGNOSIS  You might diagnose your own cold based on familiar symptoms, since most people get a cold 2 to 3 times a year. Your caregiver can confirm this based on your exam. Most importantly, your caregiver can check  that your symptoms are not due to another disease such as strep throat, sinusitis, pneumonia, asthma, or epiglottitis. Blood tests, throat tests, and X-rays are not necessary to diagnose a common cold, but they may sometimes be helpful in excluding other more serious diseases. Your caregiver will decide if any further tests are required. RISKS AND COMPLICATIONS  You may be at risk for a more severe case of the common cold if you smoke cigarettes, have chronic heart disease (such as heart failure) or lung disease (such as asthma), or if you have a weakened immune system. The very young and very old are also at risk for more serious infections. Bacterial sinusitis, middle ear infections, and bacterial pneumonia can complicate the common cold. The common cold can worsen asthma and chronic obstructive pulmonary disease (COPD). Sometimes, these complications can require emergency medical care and may be life-threatening. PREVENTION  The best way to protect against getting a cold is to practice good hygiene. Avoid oral or hand contact with people with cold symptoms. Wash your hands often if contact occurs. There is no clear evidence that vitamin C, vitamin E, echinacea, or exercise reduces the chance of developing a cold. However, it is always recommended to get plenty of rest and practice good nutrition. TREATMENT  Treatment is directed at relieving symptoms. There is no cure. Antibiotics are not effective, because the infection is caused by a virus, not by bacteria. Treatment may include:  Increased fluid intake. Sports drinks offer valuable electrolytes, sugars, and fluids.  Breathing heated mist or steam (vaporizer or shower).  Eating chicken soup or other clear  broths, and maintaining good nutrition.  Getting plenty of rest.  Using gargles or lozenges for comfort.  Controlling fevers with ibuprofen or acetaminophen as directed by your caregiver.  Increasing usage of your inhaler if you have  asthma. Zinc gel and zinc lozenges, taken in the first 24 hours of the common cold, can shorten the duration and lessen the severity of symptoms. Pain medicines may help with fever, muscle aches, and throat pain. A variety of non-prescription medicines are available to treat congestion and runny nose. Your caregiver can make recommendations and may suggest nasal or lung inhalers for other symptoms.  HOME CARE INSTRUCTIONS   Only take over-the-counter or prescription medicines for pain, discomfort, or fever as directed by your caregiver.  Use a warm mist humidifier or inhale steam from a shower to increase air moisture. This may keep secretions moist and make it easier to breathe.  Drink enough water and fluids to keep your urine clear or pale yellow.  Rest as needed.  Return to work when your temperature has returned to normal or as your caregiver advises. You may need to stay home longer to avoid infecting others. You can also use a face mask and careful hand washing to prevent spread of the virus. SEEK MEDICAL CARE IF:   After the first few days, you feel you are getting worse rather than better.  You need your caregiver's advice about medicines to control symptoms.  You develop chills, worsening shortness of breath, or brown or red sputum. These may be signs of pneumonia.  You develop yellow or brown nasal discharge or pain in the face, especially when you bend forward. These may be signs of sinusitis.  You develop a fever, swollen neck glands, pain with swallowing, or white areas in the back of your throat. These may be signs of strep throat. SEEK IMMEDIATE MEDICAL CARE IF:   You have a fever.  You develop severe or persistent headache, ear pain, sinus pain, or chest pain.  You develop wheezing, a prolonged cough, cough up blood, or have a change in your usual mucus (if you have chronic lung disease).  You develop sore muscles or a stiff neck. Document Released: 04/13/2001  Document Revised: 01/10/2012 Document Reviewed: 02/19/2011 [facility] Patient Information 2014 [place], [place].

## 2013-12-12 NOTE — Progress Notes (Signed)
Pre-visit discussion using our clinic review tool. No additional management support is needed unless otherwise documented below in the visit note.  

## 2013-12-13 ENCOUNTER — Telehealth: Payer: Self-pay | Admitting: Internal Medicine

## 2013-12-13 MED ORDER — AZITHROMYCIN 250 MG PO TABS: ORAL_TABLET | ORAL | Status: DC

## 2013-12-13 NOTE — Telephone Encounter (Signed)
Patient called and states that her fever has went up to 101.9 and she continues to cough through the night. States that she was told to called if her fever increased so that antibiotics could be considered. Please advise.

## 2013-12-13 NOTE — Telephone Encounter (Signed)
Notified pt md resent antibiotic to her pharmacy...[name]

## 2013-12-13 NOTE — Telephone Encounter (Signed)
Zpack - erx done

## 2013-12-24 ENCOUNTER — Encounter: Payer: Self-pay | Admitting: Internal Medicine

## 2013-12-24 ENCOUNTER — Ambulatory Visit (INDEPENDENT_AMBULATORY_CARE_PROVIDER_SITE_OTHER): Payer: BC Managed Care – PPO | Admitting: Internal Medicine

## 2013-12-24 VITALS — BP 118/82 | HR 81 | Temp 98.0°F | Wt 120.0 lb

## 2013-12-24 DIAGNOSIS — R040 Epistaxis: Secondary | ICD-10-CM

## 2013-12-24 NOTE — Progress Notes (Signed)
Subjective:    Patient ID: [name], female    DOB: 1951/07/02, 63 y.o.   MRN: [id number]  Epistaxis     Patient here today complaining of nosebleeds.  She is post viral URI 2 weeks ago and sinus congestion and cough have subsided.  She states that she is now having nasal bleeding with blowing her nose.  She is using saline nasal spray sporadically.  She denies other ENT symptoms.  No fevers or chills.    Past Medical History  Diagnosis Date  . Actinic keratosis   . ALLERGIC RHINITIS   . ANEMIA-NOS   . DEPRESSION   . FIBROIDS, UTERUS   . HYPERLIPIDEMIA   . Sarcoidosis 2009  . OSTEOPOROSIS 2002    on bisphos  . Macular degeneration of both eyes 2012    mild     Review of Systems  Constitutional: Negative for fever, chills, activity change and appetite change.  HENT: Positive for congestion (nasal), nosebleeds and postnasal drip. Negative for ear pain, rhinorrhea, sinus pressure, sneezing and sore throat.   Respiratory: Negative for cough, choking, chest tightness, shortness of breath and wheezing.   Cardiovascular: Negative for chest pain, palpitations and leg swelling.  Neurological: Negative for dizziness, syncope, weakness and headaches.       Objective:   Physical Exam  Constitutional: She is oriented to person, place, and time. She appears well-developed and well-nourished. No distress.  HENT:  Head: Normocephalic and atraumatic.  Right Ear: External ear normal.  Left Ear: External ear normal.  Nose: Mucosal edema (left) present. Right sinus exhibits no maxillary sinus tenderness and no frontal sinus tenderness. Left sinus exhibits no maxillary sinus tenderness and no frontal sinus tenderness.  Mouth/Throat: Mucous membranes are normal. Posterior oropharyngeal erythema present. No oropharyngeal exudate or posterior oropharyngeal edema.  Eyes: Conjunctivae are normal. Pupils are equal, round, and reactive to light. Right eye exhibits no discharge. Left eye  exhibits no discharge. No scleral icterus.  Neck: Normal range of motion. Neck supple. No thyromegaly present.  Cardiovascular: Normal rate, regular rhythm and normal heart sounds.   No murmur heard. Pulmonary/Chest: Effort normal and breath sounds normal. No respiratory distress. She has no wheezes.  Musculoskeletal: Normal range of motion.  Lymphadenopathy:    She has no cervical adenopathy.  Neurological: She is alert and oriented to person, place, and time.  Skin: Skin is warm and dry. She is not diaphoretic.  Psychiatric: She has a normal mood and affect. Her behavior is normal. Judgment and thought content normal.     BP Readings from Last 3 Encounters:  12/24/13 118/82  12/12/13 102/72  10/03/13 120/72   Wt Readings from Last 3 Encounters:  12/24/13 120 lb (54.432 kg)  12/12/13 120 lb 6.4 oz (54.613 kg)  10/03/13 120 lb (54.432 kg)   Lab Results  Component Value Date   WBC 7.9 10/05/2013   HGB 15.6* 10/05/2013   HCT 46.5* 10/05/2013   PLT 278.0 10/05/2013   GLUCOSE 84 10/05/2013   CHOL 161 10/05/2013   TRIG 92.0 10/05/2013   HDL 43.10 10/05/2013   LDLDIRECT 193.5 08/02/2012   LDLCALC 100* 10/05/2013   ALT 39* 10/05/2013   AST 39* 10/05/2013   NA 140 10/05/2013   K 3.7 10/05/2013   CL 101 10/05/2013   CREATININE 0.7 10/05/2013   BUN 9 10/05/2013   CO2 30 10/05/2013   TSH 2.42 10/05/2013       Assessment & Plan:   Epistaxis - likely due  to irritation from recent viral URI.  No spontaneous bleeding.  Advised supportive care with saline nasal spray, humidifier, and avoiding trauma. Pt to call back if symptoms worsen/not improved within 72 hours.

## 2013-12-24 NOTE — Patient Instructions (Addendum)
It was good to see you today.  Use saline irrigation 3 times daily, more if needed to keep nostrils moist  Do not aggressively blow your nose or clear your nose. Also avoid picking your nose  Use humidifier in her bedroom at night while sleeping  If symptoms unimproved in next 72 hours, please call for other suggestions. Please call sooner if worse or uncontrolled    Nosebleed Nosebleeds can be caused by many conditions including trauma, infections, polyps, foreign bodies, dry mucous membranes or climate, medications and air conditioning. Most nosebleeds occur in the front of the nose. It is because of this location that most nosebleeds can be controlled by pinching the nostrils gently and continuously. Do this for at least 10 to 20 minutes. The reason for this long continuous pressure is that you must hold it long enough for the blood to clot. If during that 10 to 20 minute time period, pressure is released, the process may have to be started again. The nosebleed may stop by itself, quit with pressure, need concentrated heating (cautery) or stop with pressure from packing. HOME CARE INSTRUCTIONS   If your nose was packed, try to maintain the pack inside until your caregiver removes it. If a gauze pack was used and it starts to fall out, gently replace or cut the end off. Do not cut if a balloon catheter was used to pack the nose. Otherwise, do not remove unless instructed.  Avoid blowing your nose for 12 hours after treatment. This could dislodge the pack or clot and start bleeding again.  If the bleeding starts again, sit up and bending forward, gently pinch the front half of your nose continuously for 20 minutes.  If bleeding was caused by dry mucous membranes, cover the inside of your nose every morning with a petroleum or antibiotic ointment. Use your little fingertip as an applicator. Do this as needed during dry weather. This will keep the mucous membranes moist and allow them to  heal.  Maintain humidity in your home by using less air conditioning or using a humidifier.  Do not use aspirin or medications which make bleeding more likely. Your caregiver can give you recommendations on this.  Resume normal activities as able but try to avoid straining, lifting or bending at the waist for several days.  If the nosebleeds become recurrent and the cause is unknown, your caregiver may suggest laboratory tests. SEEK IMMEDIATE MEDICAL CARE IF:   Bleeding recurs and cannot be controlled.  There is unusual bleeding from or bruising on other parts of the body.  You have a fever.  Nosebleeds continue.  There is any worsening of the condition which originally brought you in.  You become lightheaded, feel faint, become sweaty or vomit blood. MAKE SURE YOU:   Understand these instructions.  Will watch your condition.  Will get help right away if you are not doing well or get worse. Document Released: 07/28/2005 Document Revised: 01/10/2012 Document Reviewed: 09/19/2009 [facility] Patient Information 2014 [place], [place].

## 2013-12-24 NOTE — Progress Notes (Signed)
Pre-visit discussion using our clinic review tool. No additional management support is needed unless otherwise documented below in the visit note.  

## 2014-01-25 ENCOUNTER — Ambulatory Visit: Payer: BC Managed Care – PPO | Admitting: Internal Medicine

## 2014-04-30 ENCOUNTER — Encounter: Payer: Self-pay | Admitting: Internal Medicine

## 2014-08-01 ENCOUNTER — Encounter: Payer: Self-pay | Admitting: Internal Medicine

## 2014-10-07 ENCOUNTER — Encounter: Payer: BC Managed Care – PPO | Admitting: Internal Medicine

## 2014-10-14 ENCOUNTER — Ambulatory Visit (INDEPENDENT_AMBULATORY_CARE_PROVIDER_SITE_OTHER): Payer: BC Managed Care – PPO | Admitting: Family

## 2014-10-14 ENCOUNTER — Encounter: Payer: Self-pay | Admitting: Family

## 2014-10-14 VITALS — BP 120/82 | HR 73 | Temp 98.0°F | Resp 18 | Ht 64.5 in | Wt 117.4 lb

## 2014-10-14 DIAGNOSIS — R053 Chronic cough: Secondary | ICD-10-CM | POA: Insufficient documentation

## 2014-10-14 DIAGNOSIS — R05 Cough: Secondary | ICD-10-CM

## 2014-10-14 DIAGNOSIS — R059 Cough, unspecified: Secondary | ICD-10-CM

## 2014-10-14 MED ORDER — AMOXICILLIN-POT CLAVULANATE 875-125 MG PO TABS: 1.0000 | ORAL_TABLET | Freq: Two times a day (BID) | ORAL | Status: DC

## 2014-10-14 MED ORDER — PROMETHAZINE-DM 6.25-15 MG/5ML PO SYRP: 5.0000 mL | ORAL_SOLUTION | Freq: Four times a day (QID) | ORAL | Status: DC | PRN

## 2014-10-14 NOTE — Progress Notes (Signed)
Pre visit review using our clinic review tool, if applicable. No additional management support is needed unless otherwise documented below in the visit note. 

## 2014-10-14 NOTE — Progress Notes (Signed)
   Subjective:    Patient ID: [name], female    DOB: 12-22-1950, 63 y.o.   MRN: [id number]  Chief Complaint  Patient presents with  . Cough    Productive cough, congestion, not able to sleep, x4 days    HPI:  [name] is a 63 y.o. female who presents today for an acute visit.  Acute symptoms of productive cough, congestion and not able to sleep have been going on for about 4 days. Has had sinus headache. Denies any fever, body aches, nausea, vomiting, shortness of breath. "Wakes up coughing and blowing nose constantly, with nights being the worst." Has tried dayquil and nyquil and an over the counter sleep aid (benedryl), which none have provided much relief.   Allergies  Allergen Reactions  . Codeine     REACTION: nausea  . Hydrocodone-Acetaminophen   . Morphine And Related Nausea Only    Intense nausea   Current Outpatient Prescriptions on File Prior to Visit  Medication Sig Dispense Refill  . alendronate (FOSAMAX) 70 MG tablet Take 70 mg by mouth once a week. Take with a full glass of water on an empty stomach.    . Cholecalciferol (VITAMIN D3) 1000 UNITS CAPS Take by mouth daily.      . DiphenhydrAMINE HCl, Sleep, (SLEEP TABS II PO) Take 1 tablet by mouth at bedtime.    . Multiple Vitamins-Minerals (MACULAR VITAMIN BENEFIT) TABS Take 2 tablets by mouth daily.    . polyethylene glycol powder (MIRALAX) powder Take 17 g by mouth 2 (two) times a week.     No current facility-administered medications on file prior to visit.   Review of Systems    See HPI  Objective:    BP 120/82 mmHg  Pulse 73  Temp(Src) 98 F (36.7 C) (Oral)  Resp 18  Ht 5' 4.5" (1.638 m)  Wt 117 lb 6.4 oz (53.252 kg)  BMI 19.85 kg/m2  SpO2 97% Nursing note and vital signs reviewed.  Physical Exam  Constitutional: She is oriented to person, place, and time. She appears well-developed and well-nourished. No distress.  HENT:  Right Ear: Hearing, tympanic membrane, external ear and  ear canal normal.  Left Ear: Hearing, tympanic membrane, external ear and ear canal normal.  Nose: Nose normal. Right sinus exhibits no maxillary sinus tenderness and no frontal sinus tenderness. Left sinus exhibits no maxillary sinus tenderness and no frontal sinus tenderness.  Mouth/Throat: Uvula is midline. Posterior oropharyngeal erythema present.  Neck: Neck supple.  Cardiovascular: Normal rate, regular rhythm, normal heart sounds and intact distal pulses.   Pulmonary/Chest: Effort normal and breath sounds normal.  Lymphadenopathy:    She has cervical adenopathy.  Neurological: She is alert and oriented to person, place, and time.  Skin: Skin is warm and dry.  Psychiatric: She has a normal mood and affect. Her behavior is normal. Judgment and thought content normal.       Assessment & Plan:

## 2014-10-14 NOTE — Assessment & Plan Note (Signed)
Cough is most likely related to sinus drainage. Start Augmentin. Start promethazine DM as needed for cough and sleep. Continue over-the-counter medications as needed for symptom relief. Follow up if symptoms worsen or fail to improve.

## 2014-10-14 NOTE — Patient Instructions (Signed)
Thank you for choosing [facility].  Summary/Instructions:  Your prescription(s) have been submitted to your pharmacy. Please take as directed and contact our office if you believe you are having problem(s) with the medication(s).  If your symptoms worsen or fail to improve, please contact our office for further instruction, or in case of emergency go directly to the emergency room at the closest medical facility.   Sinusitis Sinusitis is redness, soreness, and inflammation of the paranasal sinuses. Paranasal sinuses are air pockets within the bones of your face (beneath the eyes, the middle of the forehead, or above the eyes). In healthy paranasal sinuses, mucus is able to drain out, and air is able to circulate through them by way of your nose. However, when your paranasal sinuses are inflamed, mucus and air can become trapped. This can allow bacteria and other germs to grow and cause infection. Sinusitis can develop quickly and last only a short time (acute) or continue over a long period (chronic). Sinusitis that lasts for more than 12 weeks is considered chronic.  CAUSES  Causes of sinusitis include:  Allergies.  Structural abnormalities, such as displacement of the cartilage that separates your nostrils (deviated septum), which can decrease the air flow through your nose and sinuses and affect sinus drainage.  Functional abnormalities, such as when the small hairs (cilia) that line your sinuses and help remove mucus do not work properly or are not present. SIGNS AND SYMPTOMS  Symptoms of acute and chronic sinusitis are the same. The primary symptoms are pain and pressure around the affected sinuses. Other symptoms include:  Upper toothache.  Earache.  Headache.  Bad breath.  Decreased sense of smell and taste.  A cough, which worsens when you are lying flat.  Fatigue.  Fever.  Thick drainage from your nose, which often is green and may contain pus  (purulent).  Swelling and warmth over the affected sinuses. DIAGNOSIS  Your health care provider will perform a physical exam. During the exam, your health care provider may:  Look in your nose for signs of abnormal growths in your nostrils (nasal polyps).  Tap over the affected sinus to check for signs of infection.  View the inside of your sinuses (endoscopy) using an imaging device that has a light attached (endoscope). If your health care provider suspects that you have chronic sinusitis, one or more of the following tests may be recommended:  Allergy tests.  Nasal culture. A sample of mucus is taken from your nose, sent to a lab, and screened for bacteria.  Nasal cytology. A sample of mucus is taken from your nose and examined by your health care provider to determine if your sinusitis is related to an allergy. TREATMENT  Most cases of acute sinusitis are related to a viral infection and will resolve on their own within 10 days. Sometimes medicines are prescribed to help relieve symptoms (pain medicine, decongestants, nasal steroid sprays, or saline sprays).  However, for sinusitis related to a bacterial infection, your health care provider will prescribe antibiotic medicines. These are medicines that will help kill the bacteria causing the infection.  Rarely, sinusitis is caused by a fungal infection. In theses cases, your health care provider will prescribe antifungal medicine. For some cases of chronic sinusitis, surgery is needed. Generally, these are cases in which sinusitis recurs more than 3 times per year, despite other treatments. HOME CARE INSTRUCTIONS   Drink plenty of water. Water helps thin the mucus so your sinuses can drain more easily.  Use a humidifier.  Inhale steam 3 to 4 times a day (for example, sit in the bathroom with the shower running).  Apply a warm, moist washcloth to your face 3 to 4 times a day, or as directed by your health care provider.  Use  saline nasal sprays to help moisten and clean your sinuses.  Take medicines only as directed by your health care provider.  If you were prescribed either an antibiotic or antifungal medicine, finish it all even if you start to feel better. SEEK IMMEDIATE MEDICAL CARE IF:  You have increasing pain or severe headaches.  You have nausea, vomiting, or drowsiness.  You have swelling around your face.  You have vision problems.  You have a stiff neck.  You have difficulty breathing. MAKE SURE YOU:   Understand these instructions.  Will watch your condition.  Will get help right away if you are not doing well or get worse. Document Released: 10/18/2005 Document Revised: 03/04/2014 Document Reviewed: 11/02/2011 [employer] Patient Information 2015 [employer]. This information is not intended to replace advice given to you by your health care provider. Make sure you discuss any questions you have with your health care provider.  

## 2014-10-16 ENCOUNTER — Telehealth: Payer: Self-pay | Admitting: Family

## 2014-10-16 MED ORDER — LEVOFLOXACIN 500 MG PO TABS
500.0000 mg | ORAL_TABLET | Freq: Every day | ORAL | Status: DC
Start: 2014-10-16 — End: 2015-10-28

## 2014-10-16 NOTE — Telephone Encounter (Signed)
Pt informed

## 2014-10-16 NOTE — Telephone Encounter (Signed)
Patient states [facility] gave her a script for an antibiotic to treat her sinus infection.  She states she is having a side effect of diarrhea.  She is requesting a different antibiotic to be sent to [facility] at [facility].

## 2014-10-16 NOTE — Telephone Encounter (Signed)
Please call patient and inform her that I have sent levaquin to her pharmacy in place of the Augmentin.

## 2014-10-22 ENCOUNTER — Encounter: Payer: Self-pay | Admitting: Internal Medicine

## 2014-10-22 ENCOUNTER — Other Ambulatory Visit (INDEPENDENT_AMBULATORY_CARE_PROVIDER_SITE_OTHER): Payer: BC Managed Care – PPO

## 2014-10-22 ENCOUNTER — Ambulatory Visit (INDEPENDENT_AMBULATORY_CARE_PROVIDER_SITE_OTHER): Payer: BC Managed Care – PPO | Admitting: Internal Medicine

## 2014-10-22 VITALS — BP 120/80 | HR 70 | Temp 97.7°F | Ht 64.5 in | Wt 118.0 lb

## 2014-10-22 DIAGNOSIS — M810 Age-related osteoporosis without current pathological fracture: Secondary | ICD-10-CM

## 2014-10-22 DIAGNOSIS — E785 Hyperlipidemia, unspecified: Secondary | ICD-10-CM

## 2014-10-22 DIAGNOSIS — Z0000 Encounter for general adult medical examination without abnormal findings: Secondary | ICD-10-CM

## 2014-10-22 LAB — CBC WITH DIFFERENTIAL/PLATELET
BASOS ABS: 0.1 10*3/uL (ref 0.0–0.1)
Basophils Relative: 0.7 % (ref 0.0–3.0)
EOS ABS: 0.4 10*3/uL (ref 0.0–0.7)
Eosinophils Relative: 5 % (ref 0.0–5.0)
HEMATOCRIT: 44.1 % (ref 36.0–46.0)
Hemoglobin: 14.7 g/dL (ref 12.0–15.0)
Lymphocytes Relative: 32.9 % (ref 12.0–46.0)
Lymphs Abs: 2.5 10*3/uL (ref 0.7–4.0)
MCHC: 33.4 g/dL (ref 30.0–36.0)
MCV: 91.6 fl (ref 78.0–100.0)
MONOS PCT: 6.7 % (ref 3.0–12.0)
Monocytes Absolute: 0.5 10*3/uL (ref 0.1–1.0)
NEUTROS ABS: 4.2 10*3/uL (ref 1.4–7.7)
Neutrophils Relative %: 54.7 % (ref 43.0–77.0)
PLATELETS: 325 10*3/uL (ref 150.0–400.0)
RBC: 4.82 Mil/uL (ref 3.87–5.11)
RDW: 13.5 % (ref 11.5–15.5)
WBC: 7.6 10*3/uL (ref 4.0–10.5)

## 2014-10-22 LAB — URINALYSIS, ROUTINE W REFLEX MICROSCOPIC
Bilirubin Urine: NEGATIVE
Hgb urine dipstick: NEGATIVE
KETONES UR: NEGATIVE
LEUKOCYTES UA: NEGATIVE
Nitrite: NEGATIVE
PH: 6 (ref 5.0–8.0)
RBC / HPF: NONE SEEN (ref 0–?)
Specific Gravity, Urine: <=1.005 — AB (ref 1.000–1.030)
Total Protein, Urine: NEGATIVE
Urine Glucose: NEGATIVE
Urobilinogen, UA: 0.2 (ref 0.0–1.0)

## 2014-10-22 LAB — LIPID PANEL
CHOLESTEROL: 183 mg/dL (ref 0–200)
HDL: 27.3 mg/dL — AB (ref >39.00)
LDL CALC: 126 mg/dL — AB (ref 0–99)
NonHDL: 155.7
Total CHOL/HDL Ratio: 7
Triglycerides: 150 mg/dL — ABNORMAL HIGH (ref 0.0–149.0)
VLDL: 30 mg/dL (ref 0.0–40.0)

## 2014-10-22 LAB — BASIC METABOLIC PANEL
BUN: 22 mg/dL (ref 6–23)
CO2: 27 meq/L (ref 19–32)
Calcium: 9.2 mg/dL (ref 8.4–10.5)
Chloride: 106 mEq/L (ref 96–112)
Creatinine, Ser: 0.9 mg/dL (ref 0.4–1.2)
GFR: 70.74 mL/min (ref >60.00)
GLUCOSE: 92 mg/dL (ref 70–99)
Potassium: 4.4 mEq/L (ref 3.5–5.1)
Sodium: 139 mEq/L (ref 135–145)

## 2014-10-22 LAB — TSH: TSH: 2.39 u[IU]/mL (ref 0.35–4.50)

## 2014-10-22 LAB — HEPATIC FUNCTION PANEL
ALBUMIN: 3.8 g/dL (ref 3.5–5.2)
ALT: 26 U/L (ref 0–35)
AST: 31 U/L (ref 0–37)
Alkaline Phosphatase: 43 U/L (ref 39–117)
BILIRUBIN TOTAL: 0.4 mg/dL (ref 0.2–1.2)
Bilirubin, Direct: 0.1 mg/dL (ref 0.0–0.3)
Total Protein: 7 g/dL (ref 6.0–8.3)

## 2014-10-22 NOTE — Progress Notes (Signed)
Pre visit review using our clinic review tool, if applicable. No additional management support is needed unless otherwise documented below in the visit note. 

## 2014-10-22 NOTE — Progress Notes (Signed)
Subjective:    Patient ID: [name], female    DOB: [birth year], 63 y.o.   MRN: [id number]  HPI  patient is here today for annual physical. Patient feels well and has no complaints.  Also reviewed chronic medical issues and interval medical events  Past Medical History  Diagnosis Date  . Actinic keratosis   . ALLERGIC RHINITIS   . ANEMIA-NOS   . DEPRESSION   . FIBROIDS, UTERUS   . HYPERLIPIDEMIA   . Sarcoidosis 2009  . OSTEOPOROSIS 2002    on bisphos  . Macular degeneration of both eyes 2012    mild   Family History  Problem Relation Age of Onset  . Allergies Mother   . Colon cancer Mother   . Allergies Sister   . Allergies Daughter   . Breast cancer Maternal Aunt   . Prostate cancer Maternal Uncle   . Esophageal cancer Maternal Grandfather   . Stomach cancer Neg Hx    History  Substance Use Topics  . Smoking status: Never Smoker   . Smokeless tobacco: Never Used  . Alcohol Use: 0.0 oz/week    2-4 Glasses of wine per week     Comment: social    Review of Systems  Constitutional: Negative for fatigue and unexpected weight change.  Respiratory: Negative for cough (recent bronchitis, sx improving on tx (reviewed)), shortness of breath and wheezing.   Cardiovascular: Negative for chest pain, palpitations and leg swelling.  Gastrointestinal: Negative for nausea, abdominal pain and diarrhea.  Neurological: Negative for dizziness, weakness, light-headedness and headaches.  Psychiatric/Behavioral: Negative for dysphoric mood. The patient is not nervous/anxious.   All other systems reviewed and are negative.      Objective:   Physical Exam  BP 120/80 mmHg  Pulse 70  Temp(Src) 97.7 F (36.5 C) (Oral)  Ht 5' 4.5" (1.638 m)  Wt 118 lb (53.524 kg)  BMI 19.95 kg/m2  SpO2 94% Wt Readings from Last 3 Encounters:  10/22/14 118 lb (53.524 kg)  10/14/14 117 lb 6.4 oz (53.252 kg)  12/24/13 120 lb (54.432 kg)   Constitutional: She is fit, thin; appears  well-developed and well-nourished. No distress.  HENT: Head: Normocephalic and atraumatic. Ears: B TMs ok, no erythema or effusion; Nose: Nose normal. Mouth/Throat: Oropharynx is clear and moist. No oropharyngeal exudate.  Eyes: Conjunctivae and EOM are normal. Pupils are equal, round, and reactive to light. No scleral icterus.  Neck: Normal range of motion. Neck supple. No JVD present. No thyromegaly present.  Cardiovascular: Normal rate, regular rhythm and normal heart sounds.  No murmur heard. No BLE edema. Pulmonary/Chest: Effort normal and breath sounds normal. No respiratory distress. She has no wheezes.  Abdominal: Soft. Bowel sounds are normal. She exhibits no distension. There is no tenderness. no masses GU/breast: defer to gyn Musculoskeletal: Normal range of motion, no joint effusions. No gross deformities Neurological: She is alert and oriented to person, place, and time. No cranial nerve deficit. Coordination, balance, strength, speech and gait are normal.  Skin: Skin is warm and dry. No rash noted. No erythema.  Psychiatric: She has a normal mood and affect. Her behavior is normal. Judgment and thought content normal.    Lab Results  Component Value Date   WBC 7.9 10/05/2013   HGB 15.6* 10/05/2013   HCT 46.5* 10/05/2013   PLT 278.0 10/05/2013   GLUCOSE 84 10/05/2013   CHOL 161 10/05/2013   TRIG 92.0 10/05/2013   HDL 43.10 10/05/2013   LDLDIRECT 193.5 08/02/2012  LDLCALC 100* 10/05/2013   ALT 39* 10/05/2013   AST 39* 10/05/2013   NA 140 10/05/2013   K 3.7 10/05/2013   CL 101 10/05/2013   CREATININE 0.7 10/05/2013   BUN 9 10/05/2013   CO2 30 10/05/2013   TSH 2.42 10/05/2013    No results found.     Assessment & Plan:   CPX/z00.00 - Patient has been counseled on age-appropriate routine health concerns for screening and prevention. These are reviewed and up-to-date. Immunizations are up-to-date or declined. Labs ordered and reviewed.  Problem List Items  Addressed This Visit    Hyperlipidemia    Intermittently on simva over past 5 years resumed simva 20mg  qhs due to LDL 193 in 08/2012 Then stopped 05/2014 due to ?side effects of tremor (though tremor unchanged off med) Recheck annually and consider resuming med as needed    Relevant Orders      Lipid panel   Osteoporosis    DEXA followed by gyn -last done 10/2013 On bisphos + Ca+D for same Continue WB exercise    Relevant Orders      TSH    Other Visit Diagnoses    Routine general medical examination at a health care facility    -  Primary    Relevant Orders       Basic metabolic panel       CBC with Differential       Hepatic function panel       Lipid panel       TSH       Urinalysis, Routine w reflex microscopic

## 2014-10-22 NOTE — Assessment & Plan Note (Signed)
Intermittently on simva over past 5 years resumed simva 20mg  qhs due to LDL 193 in 08/2012 Then stopped 05/2014 due to ?side effects of tremor (though tremor unchanged off med) Recheck annually and consider resuming med as needed

## 2014-10-22 NOTE — Patient Instructions (Addendum)
It was good to see you today.  We have reviewed your prior records including labs and tests today  Health Maintenance reviewed - all recommended immunizations and age-appropriate screenings are up-to-date.  Test(s) ordered today. Your results will be released to [facility] (or called to you) after review, usually within 72hours after test completion. If any changes need to be made, you will be notified at that same time.  Medications reviewed and updated, no changes recommended at this time. Reconsider resuming cholesterol medication if labs indicate need for same  Please schedule followup in 12 months for annual exam and labs, call sooner if problems.  Health Maintenance Adopting a healthy lifestyle and getting preventive care can go a long way to promote health and wellness. Talk with your health care provider about what schedule of regular examinations is right for you. This is a good chance for you to check in with your provider about disease prevention and staying healthy. In between checkups, there are plenty of things you can do on your own. Experts have done a lot of research about which lifestyle changes and preventive measures are most likely to keep you healthy. Ask your health care provider for more information. WEIGHT AND DIET  Eat a healthy diet  Be sure to include plenty of vegetables, fruits, low-fat dairy products, and lean protein.  Do not eat a lot of foods high in solid fats, added sugars, or salt.  Get regular exercise. This is one of the most important things you can do for your health.  Most adults should exercise for at least 150 minutes each week. The exercise should increase your heart rate and make you sweat (moderate-intensity exercise).  Most adults should also do strengthening exercises at least twice a week. This is in addition to the moderate-intensity exercise.  Maintain a healthy weight  Body mass index (BMI) is a measurement that can be used to identify  possible weight problems. It estimates body fat based on height and weight. Your health care provider can help determine your BMI and help you achieve or maintain a healthy weight.  For females 82 years of age and older:   A BMI below 18.5 is considered underweight.  A BMI of 18.5 to 24.9 is normal.  A BMI of 25 to 29.9 is considered overweight.  A BMI of 30 and above is considered obese.  Watch levels of cholesterol and blood lipids  You should start having your blood tested for lipids and cholesterol at 63 years of age, then have this test every 5 years.  You may need to have your cholesterol levels checked more often if:  Your lipid or cholesterol levels are high.  You are older than 63 years of age.  You are at high risk for heart disease.  CANCER SCREENING   Lung Cancer  Lung cancer screening is recommended for adults 68-31 years old who are at high risk for lung cancer because of a history of smoking.  A yearly low-dose CT scan of the lungs is recommended for people who:  Currently smoke.  Have quit within the past 15 years.  Have at least a 30-pack-year history of smoking. A pack year is smoking an average of one pack of cigarettes a day for 1 year.  Yearly screening should continue until it has been 15 years since you quit.  Yearly screening should stop if you develop a health problem that would prevent you from having lung cancer treatment.  Breast Cancer  Practice breast  self-awareness. This means understanding how your breasts normally appear and feel.  It also means doing regular breast self-exams. Let your health care provider know about any changes, no matter how small.  If you are in your 20s or 30s, you should have a clinical breast exam (CBE) by a health care provider every 1-3 years as part of a regular health exam.  If you are 30 or older, have a CBE every year. Also consider having a breast X-ray (mammogram) every year.  If you have a family  history of breast cancer, talk to your health care provider about genetic screening.  If you are at high risk for breast cancer, talk to your health care provider about having an MRI and a mammogram every year.  Breast cancer gene (BRCA) assessment is recommended for women who have family members with BRCA-related cancers. BRCA-related cancers include:  Breast.  Ovarian.  Tubal.  Peritoneal cancers.  Results of the assessment will determine the need for genetic counseling and BRCA1 and BRCA2 testing. Cervical Cancer Routine pelvic examinations to screen for cervical cancer are no longer recommended for nonpregnant women who are considered low risk for cancer of the pelvic organs (ovaries, uterus, and vagina) and who do not have symptoms. A pelvic examination may be necessary if you have symptoms including those associated with pelvic infections. Ask your health care provider if a screening pelvic exam is right for you.   The Pap test is the screening test for cervical cancer for women who are considered at risk.  If you had a hysterectomy for a problem that was not cancer or a condition that could lead to cancer, then you no longer need Pap tests.  If you are older than 65 years, and you have had normal Pap tests for the past 10 years, you no longer need to have Pap tests.  If you have had past treatment for cervical cancer or a condition that could lead to cancer, you need Pap tests and screening for cancer for at least 20 years after your treatment.  If you no longer get a Pap test, assess your risk factors if they change (such as having a new sexual partner). This can affect whether you should start being screened again.  Some women have medical problems that increase their chance of getting cervical cancer. If this is the case for you, your health care provider may recommend more frequent screening and Pap tests.  The human papillomavirus (HPV) test is another test that may be used  for cervical cancer screening. The HPV test looks for the virus that can cause cell changes in the cervix. The cells collected during the Pap test can be tested for HPV.  The HPV test can be used to screen women 70 years of age and older. Getting tested for HPV can extend the interval between normal Pap tests from three to five years.  An HPV test also should be used to screen women of any age who have unclear Pap test results.  After 63 years of age, women should have HPV testing as often as Pap tests.  Colorectal Cancer  This type of cancer can be detected and often prevented.  Routine colorectal cancer screening usually begins at 63 years of age and continues through 63 years of age.  Your health care provider may recommend screening at an earlier age if you have risk factors for colon cancer.  Your health care provider may also recommend using home test kits to check  for hidden blood in the stool.  A small camera at the end of a tube can be used to examine your colon directly (sigmoidoscopy or colonoscopy). This is done to check for the earliest forms of colorectal cancer.  Routine screening usually begins at age 61.  Direct examination of the colon should be repeated every 5-10 years through 63 years of age. However, you may need to be screened more often if early forms of precancerous polyps or small growths are found. Skin Cancer  Check your skin from head to toe regularly.  Tell your health care provider about any new moles or changes in moles, especially if there is a change in a mole's shape or color.  Also tell your health care provider if you have a mole that is larger than the size of a pencil eraser.  Always use sunscreen. Apply sunscreen liberally and repeatedly throughout the day.  Protect yourself by wearing long sleeves, pants, a wide-brimmed hat, and sunglasses whenever you are outside. HEART DISEASE, DIABETES, AND HIGH BLOOD PRESSURE   Have your blood pressure  checked at least every 1-2 years. High blood pressure causes heart disease and increases the risk of stroke.  If you are between 58 years and 31 years old, ask your health care provider if you should take aspirin to prevent strokes.  Have regular diabetes screenings. This involves taking a blood sample to check your fasting blood sugar level.  If you are at a normal weight and have a low risk for diabetes, have this test once every three years after 63 years of age.  If you are overweight and have a high risk for diabetes, consider being tested at a younger age or more often. PREVENTING INFECTION  Hepatitis B  If you have a higher risk for hepatitis B, you should be screened for this virus. You are considered at high risk for hepatitis B if:  You were born in a country where hepatitis B is common. Ask your health care provider which countries are considered high risk.  Your parents were born in a high-risk country, and you have not been immunized against hepatitis B (hepatitis B vaccine).  You have HIV or AIDS.  You use needles to inject street drugs.  You live with someone who has hepatitis B.  You have had sex with someone who has hepatitis B.  You get hemodialysis treatment.  You take certain medicines for conditions, including cancer, organ transplantation, and autoimmune conditions. Hepatitis C  Blood testing is recommended for:  Everyone born from [birth year] through 1965.  Anyone with known risk factors for hepatitis C. Sexually transmitted infections (STIs)  You should be screened for sexually transmitted infections (STIs) including gonorrhea and chlamydia if:  You are sexually active and are younger than 63 years of age.  You are older than 63 years of age and your health care provider tells you that you are at risk for this type of infection.  Your sexual activity has changed since you were last screened and you are at an increased risk for chlamydia or gonorrhea. Ask  your health care provider if you are at risk.  If you do not have HIV, but are at risk, it may be recommended that you take a prescription medicine daily to prevent HIV infection. This is called pre-exposure prophylaxis (PrEP). You are considered at risk if:  You are sexually active and do not regularly use condoms or know the HIV status of your partner(s).  You take drugs  by injection.  You are sexually active with a partner who has HIV. Talk with your health care provider about whether you are at high risk of being infected with HIV. If you choose to begin PrEP, you should first be tested for HIV. You should then be tested every 3 months for as long as you are taking PrEP.  PREGNANCY   If you are premenopausal and you may become pregnant, ask your health care provider about preconception counseling.  If you may become pregnant, take 400 to 800 micrograms (mcg) of folic acid every day.  If you want to prevent pregnancy, talk to your health care provider about birth control (contraception). OSTEOPOROSIS AND MENOPAUSE   Osteoporosis is a disease in which the bones lose minerals and strength with aging. This can result in serious bone fractures. Your risk for osteoporosis can be identified using a bone density scan.  If you are 64 years of age or older, or if you are at risk for osteoporosis and fractures, ask your health care provider if you should be screened.  Ask your health care provider whether you should take a calcium or vitamin D supplement to lower your risk for osteoporosis.  Menopause may have certain physical symptoms and risks.  Hormone replacement therapy may reduce some of these symptoms and risks. Talk to your health care provider about whether hormone replacement therapy is right for you.  HOME CARE INSTRUCTIONS   Schedule regular health, dental, and eye exams.  Stay current with your immunizations.   Do not use any tobacco products including cigarettes, chewing  tobacco, or electronic cigarettes.  If you are pregnant, do not drink alcohol.  If you are breastfeeding, limit how much and how often you drink alcohol.  Limit alcohol intake to no more than 1 drink per day for nonpregnant women. One drink equals 12 ounces of beer, 5 ounces of wine, or 1 ounces of hard liquor.  Do not use street drugs.  Do not share needles.  Ask your health care provider for help if you need support or information about quitting drugs.  Tell your health care provider if you often feel depressed.  Tell your health care provider if you have ever been abused or do not feel safe at home. Document Released: 05/03/2011 Document Revised: 03/04/2014 Document Reviewed: 09/19/2013 [facility] Patient Information 2015 [place], [place]. This information is not intended to replace advice given to you by your health care provider. Make sure you discuss any questions you have with your health care provider.

## 2014-10-22 NOTE — Assessment & Plan Note (Signed)
DEXA followed by gyn -last done 10/2013 On bisphos + Ca+D for same Continue WB exercise

## 2015-02-24 LAB — HM MAMMOGRAPHY

## 2015-03-12 ENCOUNTER — Encounter: Payer: Self-pay | Admitting: Internal Medicine

## 2015-03-21 ENCOUNTER — Encounter: Payer: Self-pay | Admitting: Gastroenterology

## 2015-04-09 ENCOUNTER — Ambulatory Visit (INDEPENDENT_AMBULATORY_CARE_PROVIDER_SITE_OTHER): Payer: BC Managed Care – PPO | Admitting: Family

## 2015-04-09 ENCOUNTER — Encounter: Payer: Self-pay | Admitting: Family

## 2015-04-09 VITALS — BP 116/72 | HR 72 | Temp 98.1°F | Wt 119.8 lb

## 2015-04-09 DIAGNOSIS — F411 Generalized anxiety disorder: Secondary | ICD-10-CM | POA: Diagnosis not present

## 2015-04-09 DIAGNOSIS — F419 Anxiety disorder, unspecified: Secondary | ICD-10-CM | POA: Insufficient documentation

## 2015-04-09 MED ORDER — ALPRAZOLAM 0.25 MG PO TABS: 0.2500 mg | ORAL_TABLET | Freq: Two times a day (BID) | ORAL | Status: DC | PRN

## 2015-04-09 NOTE — Assessment & Plan Note (Signed)
Symptoms and exam consistent with anxiety resulting in sleep disturbance as the result of multiple stressors. Sleep disturbance has been refractory to sleep hygiene and OTC medications. Start Xanax as needed for anxiety and sleep. Risks and benefits of medication reviewed. Follow up in one month to determine effectiveness.

## 2015-04-09 NOTE — Progress Notes (Signed)
Pre visit review using our clinic review tool, if applicable. No additional management support is needed unless otherwise documented below in the visit note. 

## 2015-04-09 NOTE — Progress Notes (Signed)
Subjective:    Patient ID: [name], female    DOB: [birth year], 64 y.o.   MRN: [id number]  Chief Complaint  Patient presents with  . Follow-up    follow up with meds and also wants to try xanax for sleep and stress    HPI:  [name] is a 64 y.o. female with a PMH of allergic rhinitis, essential tremor, osteoporosis, fibroids, and hyperlipidemia who presents today for a follow-up office visit.   1.) Anxiety - Associated symptoms of anxiety, stress and sleep disturbance has been going on for about 1 year and is exacerbated by family and caregiver stress as she is the sole caregiver of her mother. Indicates the severity of the anxiety symptoms are enough to effect her sleep. Notes that her mind continually wanders and feels sadness when she attempts to sleep. Modifying factors include Unisom, melatonin and analgesic combinations which has not improved her overall sleep. Her current sleep pattern varies. Does occasionally have anxiety over the course of the day as well when she feels like she cannot feel like she can tolerate it all. Has taken a Xanax before in certain situations which did help with her overall symptoms.   Allergies  Allergen Reactions  . Codeine     REACTION: nausea  . Hydrocodone-Acetaminophen   . Morphine And Related Nausea Only    Intense nausea    Current Outpatient Prescriptions on File Prior to Visit  Medication Sig Dispense Refill  . alendronate (FOSAMAX) 70 MG tablet Take 70 mg by mouth once a week. Take with a full glass of water on an empty stomach.    . Cholecalciferol (VITAMIN D3) 1000 UNITS CAPS Take by mouth daily.      . DiphenhydrAMINE HCl, Sleep, (SLEEP TABS II PO) Take 1 tablet by mouth at bedtime.    Marland Kitchen levofloxacin (LEVAQUIN) 500 MG tablet Take 1 tablet (500 mg total) by mouth daily. 7 tablet 0  . Multiple Vitamins-Minerals (MACULAR VITAMIN BENEFIT) TABS Take 2 tablets by mouth daily.    . polyethylene glycol powder (MIRALAX) powder  Take 17 g by mouth 2 (two) times a week.    . promethazine-dextromethorphan (PROMETHAZINE-DM) 6.25-15 MG/5ML syrup Take 5 mLs by mouth 4 (four) times daily as needed for cough. 118 mL 0   No current facility-administered medications on file prior to visit.    Review of Systems  Psychiatric/Behavioral: Positive for sleep disturbance and dysphoric mood. The patient is nervous/anxious.       Objective:    BP 116/72 mmHg  Pulse 72  Temp(Src) 98.1 F (36.7 C)  Wt 119 lb 12 oz (54.318 kg)  SpO2 98% Nursing note and vital signs reviewed.  Physical Exam  Constitutional: She is oriented to person, place, and time. She appears well-developed and well-nourished. No distress.  Cardiovascular: Normal rate, regular rhythm, normal heart sounds and intact distal pulses.   Pulmonary/Chest: Effort normal and breath sounds normal.  Neurological: She is alert and oriented to person, place, and time.  Skin: Skin is warm and dry.  Psychiatric: Her behavior is normal. Judgment and thought content normal. Her mood appears anxious.       Assessment & Plan:    Problem List Items Addressed This Visit      Other   Anxiety state - Primary    Symptoms and exam consistent with anxiety resulting in sleep disturbance as the result of multiple stressors. Sleep disturbance has been refractory to sleep hygiene and OTC medications. Start  Xanax as needed for anxiety and sleep. Risks and benefits of medication reviewed. Follow up in one month to determine effectiveness.       Relevant Medications   ALPRAZolam (XANAX) 0.25 MG tablet

## 2015-04-09 NOTE — Patient Instructions (Signed)
Thank you for choosing [facility].  Summary/Instructions:  Your prescription(s) have been submitted to your pharmacy or been printed and provided for you. Please take as directed and contact our office if you believe you are having problem(s) with the medication(s) or have any questions.  If your symptoms worsen or fail to improve, please contact our office for further instruction, or in case of emergency go directly to the emergency room at the closest medical facility.   Generalized Anxiety Disorder Generalized anxiety disorder (GAD) is a mental disorder. It interferes with life functions, including relationships, work, and school. GAD is different from normal anxiety, which everyone experiences at some point in their lives in response to specific life events and activities. Normal anxiety actually helps us prepare for and get through these life events and activities. Normal anxiety goes away after the event or activity is over.  GAD causes anxiety that is not necessarily related to specific events or activities. It also causes excess anxiety in proportion to specific events or activities. The anxiety associated with GAD is also difficult to control. GAD can vary from mild to severe. People with severe GAD can have intense waves of anxiety with physical symptoms (panic attacks).  SYMPTOMS The anxiety and worry associated with GAD are difficult to control. This anxiety and worry are related to many life events and activities and also occur more days than not for 6 months or longer. People with GAD also have three or more of the following symptoms (one or more in children):  Restlessness.   Fatigue.  Difficulty concentrating.   Irritability.  Muscle tension.  Difficulty sleeping or unsatisfying sleep. DIAGNOSIS GAD is diagnosed through an assessment by your health care provider. Your health care provider will ask you questions aboutyour mood,physical symptoms, and events in  your life. Your health care provider may ask you about your medical history and use of alcohol or drugs, including prescription medicines. Your health care provider may also do a physical exam and blood tests. Certain medical conditions and the use of certain substances can cause symptoms similar to those associated with GAD. Your health care provider may refer you to a mental health specialist for further evaluation. TREATMENT The following therapies are usually used to treat GAD:   Medication. Antidepressant medication usually is prescribed for long-term daily control. Antianxiety medicines may be added in severe cases, especially when panic attacks occur.   Talk therapy (psychotherapy). Certain types of talk therapy can be helpful in treating GAD by providing support, education, and guidance. A form of talk therapy called cognitive behavioral therapy can teach you healthy ways to think about and react to daily life events and activities.  Stress managementtechniques. These include yoga, meditation, and exercise and can be very helpful when they are practiced regularly. A mental health specialist can help determine which treatment is best for you. Some people see improvement with one therapy. However, other people require a combination of therapies. Document Released: 02/12/2013 Document Revised: 03/04/2014 Document Reviewed: 02/12/2013 [employer] Patient Information 2015 [employer]. This information is not intended to replace advice given to you by your health care provider. Make sure you discuss any questions you have with your health care provider.    

## 2015-05-30 ENCOUNTER — Encounter: Payer: Self-pay | Admitting: Gastroenterology

## 2015-08-01 ENCOUNTER — Encounter: Payer: BC Managed Care – PPO | Admitting: Gastroenterology

## 2015-10-08 ENCOUNTER — Other Ambulatory Visit: Payer: Self-pay | Admitting: Family

## 2015-10-10 NOTE — Telephone Encounter (Signed)
Faxed script back to [facility]...[name]

## 2015-10-10 NOTE — Telephone Encounter (Signed)
Pt has CPX appt set-up for 12/27, MD is out pls advise on refill...[name]

## 2015-10-28 ENCOUNTER — Encounter: Payer: Self-pay | Admitting: Internal Medicine

## 2015-10-28 ENCOUNTER — Ambulatory Visit (INDEPENDENT_AMBULATORY_CARE_PROVIDER_SITE_OTHER): Payer: BC Managed Care – PPO | Admitting: Internal Medicine

## 2015-10-28 ENCOUNTER — Other Ambulatory Visit (INDEPENDENT_AMBULATORY_CARE_PROVIDER_SITE_OTHER): Payer: BC Managed Care – PPO

## 2015-10-28 VITALS — BP 110/80 | HR 80 | Temp 97.9°F | Ht 64.5 in | Wt 119.1 lb

## 2015-10-28 DIAGNOSIS — G250 Essential tremor: Secondary | ICD-10-CM | POA: Diagnosis not present

## 2015-10-28 DIAGNOSIS — F411 Generalized anxiety disorder: Secondary | ICD-10-CM

## 2015-10-28 DIAGNOSIS — M810 Age-related osteoporosis without current pathological fracture: Secondary | ICD-10-CM

## 2015-10-28 DIAGNOSIS — E785 Hyperlipidemia, unspecified: Secondary | ICD-10-CM

## 2015-10-28 DIAGNOSIS — Z0000 Encounter for general adult medical examination without abnormal findings: Secondary | ICD-10-CM | POA: Diagnosis not present

## 2015-10-28 DIAGNOSIS — Z1159 Encounter for screening for other viral diseases: Secondary | ICD-10-CM

## 2015-10-28 DIAGNOSIS — C49A0 Gastrointestinal stromal tumor, unspecified site: Secondary | ICD-10-CM | POA: Insufficient documentation

## 2015-10-28 LAB — BASIC METABOLIC PANEL
BUN: 17 mg/dL (ref 6–23)
CO2: 31 meq/L (ref 19–32)
Calcium: 9.5 mg/dL (ref 8.4–10.5)
Chloride: 103 mEq/L (ref 96–112)
Creatinine, Ser: 0.78 mg/dL (ref 0.40–1.20)
GFR: 78.92 mL/min (ref >60.00)
GLUCOSE: 94 mg/dL (ref 70–99)
POTASSIUM: 3.9 meq/L (ref 3.5–5.1)
SODIUM: 141 meq/L (ref 135–145)

## 2015-10-28 LAB — CBC WITH DIFFERENTIAL/PLATELET
Basophils Absolute: 0.1 10*3/uL (ref 0.0–0.1)
Basophils Relative: 1.1 % (ref 0.0–3.0)
EOS PCT: 6.6 % — AB (ref 0.0–5.0)
Eosinophils Absolute: 0.4 10*3/uL (ref 0.0–0.7)
HCT: 47 % — ABNORMAL HIGH (ref 36.0–46.0)
Hemoglobin: 15.7 g/dL — ABNORMAL HIGH (ref 12.0–15.0)
LYMPHS ABS: 2 10*3/uL (ref 0.7–4.0)
Lymphocytes Relative: 35 % (ref 12.0–46.0)
MCHC: 33.4 g/dL (ref 30.0–36.0)
MCV: 92.5 fl (ref 78.0–100.0)
MONOS PCT: 7.2 % (ref 3.0–12.0)
Monocytes Absolute: 0.4 10*3/uL (ref 0.1–1.0)
NEUTROS ABS: 2.8 10*3/uL (ref 1.4–7.7)
NEUTROS PCT: 50.1 % (ref 43.0–77.0)
PLATELETS: 291 10*3/uL (ref 150.0–400.0)
RBC: 5.08 Mil/uL (ref 3.87–5.11)
RDW: 13.4 % (ref 11.5–15.5)
WBC: 5.6 10*3/uL (ref 4.0–10.5)

## 2015-10-28 LAB — LIPID PANEL
CHOLESTEROL: 254 mg/dL — AB (ref 0–200)
HDL: 52 mg/dL (ref >39.00)
LDL Cholesterol: 167 mg/dL — ABNORMAL HIGH (ref 0–99)
NonHDL: 202.44
Total CHOL/HDL Ratio: 5
Triglycerides: 176 mg/dL — ABNORMAL HIGH (ref 0.0–149.0)
VLDL: 35.2 mg/dL (ref 0.0–40.0)

## 2015-10-28 LAB — URINALYSIS, ROUTINE W REFLEX MICROSCOPIC
Bilirubin Urine: NEGATIVE
Hgb urine dipstick: NEGATIVE
Ketones, ur: NEGATIVE
Leukocytes, UA: NEGATIVE
Nitrite: NEGATIVE
PH: 7.5 (ref 5.0–8.0)
RBC / HPF: NONE SEEN (ref 0–?)
SPECIFIC GRAVITY, URINE: 1.01 (ref 1.000–1.030)
TOTAL PROTEIN, URINE-UPE24: NEGATIVE
Urine Glucose: NEGATIVE
Urobilinogen, UA: 0.2 (ref 0.0–1.0)

## 2015-10-28 LAB — HEPATIC FUNCTION PANEL
ALBUMIN: 4.3 g/dL (ref 3.5–5.2)
ALT: 16 U/L (ref 0–35)
AST: 18 U/L (ref 0–37)
Alkaline Phosphatase: 42 U/L (ref 39–117)
Bilirubin, Direct: 0.1 mg/dL (ref 0.0–0.3)
TOTAL PROTEIN: 7.4 g/dL (ref 6.0–8.3)
Total Bilirubin: 0.5 mg/dL (ref 0.2–1.2)

## 2015-10-28 LAB — TSH: TSH: 2.67 u[IU]/mL (ref 0.35–4.50)

## 2015-10-28 LAB — HEPATITIS C ANTIBODY: HCV Ab: NEGATIVE

## 2015-10-28 MED ORDER — ALPRAZOLAM 0.25 MG PO TABS: 0.2500 mg | ORAL_TABLET | Freq: Two times a day (BID) | ORAL | Status: DC | PRN

## 2015-10-28 NOTE — Patient Instructions (Addendum)
It was good to see you today.  We have reviewed your prior records including labs and tests today  Health Maintenance reviewed - colonoscopy next year after 65yo - all other recommended immunizations and age-appropriate screenings are up-to-date.  Test(s) ordered today. Your results will be released to [place] (or called to you) after review, usually within 72hours after test completion. If any changes need to be made, you will be notified at that same time.  Medications reviewed and updated, no changes recommended at this time. Refill on medication(s) as discussed today.  Please schedule followup in 12 months for annual exam and labs with new PCP, call sooner if problems.  Health Maintenance, Female Adopting a healthy lifestyle and getting preventive care can go a long way to promote health and wellness. Talk with your health care provider about what schedule of regular examinations is right for you. This is a good chance for you to check in with your provider about disease prevention and staying healthy. In between checkups, there are plenty of things you can do on your own. Experts have done a lot of research about which lifestyle changes and preventive measures are most likely to keep you healthy. Ask your health care provider for more information. WEIGHT AND DIET  Eat a healthy diet  Be sure to include plenty of vegetables, fruits, low-fat dairy products, and lean protein.  Do not eat a lot of foods high in solid fats, added sugars, or salt.  Get regular exercise. This is one of the most important things you can do for your health.  Most adults should exercise for at least 150 minutes each week. The exercise should increase your heart rate and make you sweat (moderate-intensity exercise).  Most adults should also do strengthening exercises at least twice a week. This is in addition to the moderate-intensity exercise.  Maintain a healthy weight  Body mass index (BMI) is a  measurement that can be used to identify possible weight problems. It estimates body fat based on height and weight. Your health care provider can help determine your BMI and help you achieve or maintain a healthy weight.  For females 60 years of age and older:   A BMI below 18.5 is considered underweight.  A BMI of 18.5 to 24.9 is normal.  A BMI of 25 to 29.9 is considered overweight.  A BMI of 30 and above is considered obese.  Watch levels of cholesterol and blood lipids  You should start having your blood tested for lipids and cholesterol at 64 years of age, then have this test every 5 years.  You may need to have your cholesterol levels checked more often if:  Your lipid or cholesterol levels are high.  You are older than 64 years of age.  You are at high risk for heart disease.  CANCER SCREENING   Lung Cancer  Lung cancer screening is recommended for adults 85-23 years old who are at high risk for lung cancer because of a history of smoking.  A yearly low-dose CT scan of the lungs is recommended for people who:  Currently smoke.  Have quit within the past 15 years.  Have at least a 30-pack-year history of smoking. A pack year is smoking an average of one pack of cigarettes a day for 1 year.  Yearly screening should continue until it has been 15 years since you quit.  Yearly screening should stop if you develop a health problem that would prevent you from having lung cancer  Breast Cancer  Practice breast self-awareness. This means understanding how your breasts normally appear and feel.  It also means doing regular breast self-exams. Let your health care provider know about any changes, no matter how small.  If you are in your 20s or 30s, you should have a clinical breast exam (CBE) by a health care provider every 1-3 years as part of a regular health exam.  If you are 40 or older, have a CBE every year. Also consider having a breast X-ray  (mammogram) every year.  If you have a family history of breast cancer, talk to your health care provider about genetic screening.  If you are at high risk for breast cancer, talk to your health care provider about having an MRI and a mammogram every year.  Breast cancer gene (BRCA) assessment is recommended for women who have family members with BRCA-related cancers. BRCA-related cancers include:  Breast.  Ovarian.  Tubal.  Peritoneal cancers.  Results of the assessment will determine the need for genetic counseling and BRCA1 and BRCA2 testing. Cervical Cancer Your health care provider may recommend that you be screened regularly for cancer of the pelvic organs (ovaries, uterus, and vagina). This screening involves a pelvic examination, including checking for microscopic changes to the surface of your cervix (Pap test). You may be encouraged to have this screening done every 3 years, beginning at age 21.  For women ages 30-65, health care providers may recommend pelvic exams and Pap testing every 3 years, or they may recommend the Pap and pelvic exam, combined with testing for human papilloma virus (HPV), every 5 years. Some types of HPV increase your risk of cervical cancer. Testing for HPV may also be done on women of any age with unclear Pap test results.  Other health care providers may not recommend any screening for nonpregnant women who are considered low risk for pelvic cancer and who do not have symptoms. Ask your health care provider if a screening pelvic exam is right for you.  If you have had past treatment for cervical cancer or a condition that could lead to cancer, you need Pap tests and screening for cancer for at least 20 years after your treatment. If Pap tests have been discontinued, your risk factors (such as having a new sexual partner) need to be reassessed to determine if screening should resume. Some women have medical problems that increase the chance of getting  cervical cancer. In these cases, your health care provider may recommend more frequent screening and Pap tests. Colorectal Cancer  This type of cancer can be detected and often prevented.  Routine colorectal cancer screening usually begins at 64 years of age and continues through 64 years of age.  Your health care provider may recommend screening at an earlier age if you have risk factors for colon cancer.  Your health care provider may also recommend using home test kits to check for hidden blood in the stool.  A small camera at the end of a tube can be used to examine your colon directly (sigmoidoscopy or colonoscopy). This is done to check for the earliest forms of colorectal cancer.  Routine screening usually begins at age 50.  Direct examination of the colon should be repeated every 5-10 years through 64 years of age. However, you may need to be screened more often if early forms of precancerous polyps or small growths are found. Skin Cancer  Check your skin from head to toe regularly.  Tell your health care   provider about any new moles or changes in moles, especially if there is a change in a mole's shape or color.  Also tell your health care provider if you have a mole that is larger than the size of a pencil eraser.  Always use sunscreen. Apply sunscreen liberally and repeatedly throughout the day.  Protect yourself by wearing long sleeves, pants, a wide-brimmed hat, and sunglasses whenever you are outside. HEART DISEASE, DIABETES, AND HIGH BLOOD PRESSURE   High blood pressure causes heart disease and increases the risk of stroke. High blood pressure is more likely to develop in:  People who have blood pressure in the high end of the normal range (130-139/85-89 mm Hg).  People who are overweight or obese.  People who are African American.  If you are 18-39 years of age, have your blood pressure checked every 3-5 years. If you are 40 years of age or older, have your blood  pressure checked every year. You should have your blood pressure measured twice--once when you are at a hospital or clinic, and once when you are not at a hospital or clinic. Record the average of the two measurements. To check your blood pressure when you are not at a hospital or clinic, you can use:  An automated blood pressure machine at a pharmacy.  A home blood pressure monitor.  If you are between 55 years and 79 years old, ask your health care provider if you should take aspirin to prevent strokes.  Have regular diabetes screenings. This involves taking a blood sample to check your fasting blood sugar level.  If you are at a normal weight and have a low risk for diabetes, have this test once every three years after 64 years of age.  If you are overweight and have a high risk for diabetes, consider being tested at a younger age or more often. PREVENTING INFECTION  Hepatitis B  If you have a higher risk for hepatitis B, you should be screened for this virus. You are considered at high risk for hepatitis B if:  You were born in a country where hepatitis B is common. Ask your health care provider which countries are considered high risk.  Your parents were born in a high-risk country, and you have not been immunized against hepatitis B (hepatitis B vaccine).  You have HIV or AIDS.  You use needles to inject street drugs.  You live with someone who has hepatitis B.  You have had sex with someone who has hepatitis B.  You get hemodialysis treatment.  You take certain medicines for conditions, including cancer, organ transplantation, and autoimmune conditions. Hepatitis C  Blood testing is recommended for:  Everyone born from 1945 through 1965.  Anyone with known risk factors for hepatitis C. Sexually transmitted infections (STIs)  You should be screened for sexually transmitted infections (STIs) including gonorrhea and chlamydia if:  You are sexually active and are  younger than 64 years of age.  You are older than 64 years of age and your health care provider tells you that you are at risk for this type of infection.  Your sexual activity has changed since you were last screened and you are at an increased risk for chlamydia or gonorrhea. Ask your health care provider if you are at risk.  If you do not have HIV, but are at risk, it may be recommended that you take a prescription medicine daily to prevent HIV infection. This is called pre-exposure prophylaxis (PrEP). You are   considered at risk if:  You are sexually active and do not regularly use condoms or know the HIV status of your partner(s).  You take drugs by injection.  You are sexually active with a partner who has HIV. Talk with your health care provider about whether you are at high risk of being infected with HIV. If you choose to begin PrEP, you should first be tested for HIV. You should then be tested every 3 months for as long as you are taking PrEP.  PREGNANCY   If you are premenopausal and you may become pregnant, ask your health care provider about preconception counseling.  If you may become pregnant, take 400 to 800 micrograms (mcg) of folic acid every day.  If you want to prevent pregnancy, talk to your health care provider about birth control (contraception). OSTEOPOROSIS AND MENOPAUSE   Osteoporosis is a disease in which the bones lose minerals and strength with aging. This can result in serious bone fractures. Your risk for osteoporosis can be identified using a bone density scan.  If you are 65 years of age or older, or if you are at risk for osteoporosis and fractures, ask your health care provider if you should be screened.  Ask your health care provider whether you should take a calcium or vitamin D supplement to lower your risk for osteoporosis.  Menopause may have certain physical symptoms and risks.  Hormone replacement therapy may reduce some of these symptoms and  risks. Talk to your health care provider about whether hormone replacement therapy is right for you.  HOME CARE INSTRUCTIONS   Schedule regular health, dental, and eye exams.  Stay current with your immunizations.   Do not use any tobacco products including cigarettes, chewing tobacco, or electronic cigarettes.  If you are pregnant, do not drink alcohol.  If you are breastfeeding, limit how much and how often you drink alcohol.  Limit alcohol intake to no more than 1 drink per day for nonpregnant women. One drink equals 12 ounces of beer, 5 ounces of wine, or 1 ounces of hard liquor.  Do not use street drugs.  Do not share needles.  Ask your health care provider for help if you need support or information about quitting drugs.  Tell your health care provider if you often feel depressed.  Tell your health care provider if you have ever been abused or do not feel safe at home.   This information is not intended to replace advice given to you by your health care provider. Make sure you discuss any questions you have with your health care provider.   Document Released: 05/03/2011 Document Revised: 11/08/2014 Document Reviewed: 09/19/2013 Elsevier Interactive Patient Education 2016 [employer]  

## 2015-10-28 NOTE — Assessment & Plan Note (Signed)
Chronic symptoms, most pronounced in R hand, also voice/head/neck - slowly progressive eval by Dr Tat 2014 - intolerant of side effects of Inderal and primidone Feels symptoms not as bad as tx side effects at that time, though uses occ Xanax 0.25 prior to going out to help control symptoms  -  Will continue observation only, follow up neuro as needed

## 2015-10-28 NOTE — Progress Notes (Signed)
Subjective:    Patient ID: [name], female    DOB: 23-Feb-1951, 64 y.o.   MRN: [id number]  HPI  patient is here today for annual physical. Patient feels well overall. Also reviewed chronic medical conditions, interval events and current concerns   Past Medical History  Diagnosis Date  . Actinic keratosis   . ALLERGIC RHINITIS   . ANEMIA-NOS   . DEPRESSION   . FIBROIDS, UTERUS   . HYPERLIPIDEMIA   . Sarcoidosis ([place]) 2009  . OSTEOPOROSIS 2002    on bisphos  . Macular degeneration of both eyes 2012    mild  . 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 Allergies Mother   . Colon cancer Mother   . Allergies Sister   . Allergies Daughter   . Breast cancer Maternal Aunt   . Prostate cancer Maternal Uncle   . Esophageal cancer Maternal Grandfather   . Stomach cancer Neg Hx    Social History  Substance Use Topics  . Smoking status: Never Smoker   . Smokeless tobacco: Never Used  . Alcohol Use: 0.0 oz/week    2-4 Glasses of wine per week     Comment: social    Review of Systems  Constitutional: Negative for fatigue and unexpected weight change.  Respiratory: Negative for cough, shortness of breath and wheezing.   Cardiovascular: Negative for chest pain, palpitations and leg swelling.  Gastrointestinal: Negative for nausea, abdominal pain and diarrhea.  Neurological: Negative for dizziness, weakness, light-headedness and headaches.  Psychiatric/Behavioral: Positive for sleep disturbance. Negative for dysphoric mood. The patient is not nervous/anxious.   All other systems reviewed and are negative.      Objective:    Physical Exam  Constitutional: She is oriented to person, place, and time. She appears well-developed and well-nourished. No distress.  HENT:  Head: Normocephalic and atraumatic.    Right Ear: External ear normal.  Left Ear: External ear normal.  Nose: Nose normal.  Mouth/Throat: Oropharynx is clear and moist. No oropharyngeal exudate.  Eyes: EOM are normal. Pupils are equal, round, and reactive to light. Right eye exhibits no discharge. Left eye exhibits no discharge. No scleral icterus.  Neck: Normal range of motion. Neck supple. No JVD present. No tracheal deviation present. No thyromegaly present.  Cardiovascular: Normal rate, regular rhythm, normal heart sounds and intact distal pulses.  Exam reveals no friction rub.   No murmur heard. Pulmonary/Chest: Effort normal and breath sounds normal. No respiratory distress. She has no wheezes. She has no rales. She exhibits no tenderness.  Abdominal: Soft. Bowel sounds are normal. She exhibits no distension and no mass. There is no tenderness. There is no rebound and no guarding.  Genitourinary:  Defer to gyn  Musculoskeletal: Normal range of motion.  No gross deformities  Lymphadenopathy:    She has no cervical adenopathy.  Neurological: She is alert and oriented to person, place, and time. She has normal reflexes. No cranial nerve deficit.  Skin: Skin is warm and dry. No rash noted. She is not diaphoretic. No erythema.  Psychiatric: She has a normal mood and affect. Her behavior is normal. Judgment and thought content normal.  Nursing note and vitals reviewed.   BP 110/80 mmHg  Pulse 80  Temp(Src) 97.9 F (36.6 C) (Oral)  Ht 5' 4.5" (1.638 m)  Wt 119 lb  1 oz (54.006 kg)  BMI 20.13 kg/m2  SpO2 95% Wt Readings from Last 3 Encounters:  10/28/15 119 lb 1 oz (54.006 kg)  04/09/15 119 lb 12 oz (54.318 kg)  10/22/14 118 lb (53.524 kg)     Lab Results  Component Value Date   WBC 7.6 10/22/2014   HGB 14.7 10/22/2014   HCT 44.1 10/22/2014   PLT 325.0 10/22/2014   GLUCOSE 92 10/22/2014   CHOL 183 10/22/2014   TRIG 150.0* 10/22/2014   HDL 27.30* 10/22/2014   LDLDIRECT 193.5 08/02/2012   LDLCALC 126*  10/22/2014   ALT 26 10/22/2014   AST 31 10/22/2014   NA 139 10/22/2014   K 4.4 10/22/2014   CL 106 10/22/2014   CREATININE 0.9 10/22/2014   BUN 22 10/22/2014   CO2 27 10/22/2014   TSH 2.39 10/22/2014    No results found.     Assessment & Plan:   CPX/z00.00 - Patient has been counseled on age-appropriate routine health concerns for screening and prevention. These are reviewed and up-to-date. Immunizations are up-to-date or declined. Labs ordered and reviewed.  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) and insomnia (if OTC aide ineffective) Ok to refill same given low dose and infreq use      Relevant Medications   ALPRAZolam (XANAX) 0.25 MG tablet   Essential tremor    Chronic symptoms, most pronounced in R hand, also voice/head/neck - slowly progressive eval by Dr Tat 2014 - intolerant of side effects of Inderal and primidone Feels symptoms not as bad as tx side effects at that time, though uses occ Xanax 0.25 prior to going out to help control symptoms  -  Will continue observation only, follow up neuro as needed      Relevant Orders   Basic metabolic panel   TSH   Hyperlipidemia    Intermittently on simva over past 5 years resumed simva 20mg  qhs due to LDL 193 in 08/2012 Elected to stop statin 05/2014 due to ?side effects of tremor (though tremor unchanged off med) Recheck annually and consider resuming med as needed      Relevant Orders   Lipid panel   Osteoporosis    DEXA followed by gyn -last done 01/2015: improved since 2014 scan On bisphos + Ca+D for same as per gyn rx Continue WB exercise No changes recommended        Other Visit Diagnoses    Routine general medical examination at a health care facility    -  Primary    Relevant Orders    Basic metabolic panel    CBC with Differential/Platelet    Hepatic function panel    Lipid panel    TSH    Urinalysis, Routine w reflex  microscopic (not at [facility])    Hepatitis C antibody    Need for hepatitis C screening test        Relevant Orders    Hepatitis C antibody        Gwendolyn Grant, MD

## 2015-10-28 NOTE — Assessment & Plan Note (Signed)
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

## 2015-10-28 NOTE — Assessment & Plan Note (Signed)
Intermittently on simva over past 5 years resumed simva 20mg  qhs due to LDL 193 in 08/2012 Elected to stop statin 05/2014 due to ?side effects of tremor (though tremor unchanged off med) Recheck annually and consider resuming med as needed

## 2015-10-28 NOTE — Progress Notes (Signed)
Pre visit review using our clinic review tool, if applicable. No additional management support is needed unless otherwise documented below in the visit note. 

## 2015-10-28 NOTE — Assessment & Plan Note (Signed)
DEXA followed by gyn -last done 01/2015: improved since 2014 scan On bisphos + Ca+D for same as per gyn rx Continue WB exercise No changes recommended

## 2016-03-10 LAB — HM MAMMOGRAPHY

## 2016-03-30 ENCOUNTER — Ambulatory Visit (INDEPENDENT_AMBULATORY_CARE_PROVIDER_SITE_OTHER): Payer: BC Managed Care – PPO | Admitting: Family

## 2016-03-30 ENCOUNTER — Encounter: Payer: Self-pay | Admitting: Family

## 2016-03-30 VITALS — BP 128/72 | HR 71 | Temp 98.1°F | Resp 14 | Ht 64.5 in | Wt 118.0 lb

## 2016-03-30 DIAGNOSIS — R5383 Other fatigue: Secondary | ICD-10-CM | POA: Diagnosis not present

## 2016-03-30 MED ORDER — MIRTAZAPINE 15 MG PO TABS: 15.0000 mg | ORAL_TABLET | Freq: Every day | ORAL | Status: DC

## 2016-03-30 NOTE — Progress Notes (Signed)
Subjective:    Patient ID: [name], female    DOB: 1951-08-19, 65 y.o.   MRN: [id number]  Chief Complaint  Patient presents with  . Depression    has issues with depression and would like to get on something for it, has thoughts that she would be better off dead, states she could never do anything to physically hurt herself    HPI:  [name] is a 65 y.o. 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 and presents today for an office visit.   Depression - Not currently maintained on medication for the past several years. Associated symptoms of decreased sleep . She does have thoughts of suicide and wishes that she would have thoughts of amnesia would be better. Describes she wants "to be able to accept her life." She continues to care for her family specifically taking care of her mother and helping with her grandson. Notes her mother wants to stay around her house. She talks with her several times per week and with things that suddenly go wrong or grocery shopping or doctor appointments. Notes that her mother does have a good support system around her. Explains that she does enjoy movies, television, novels and food. Sleeping on average about 7 hours per night. Symptoms have improved this week with the rest. Modifying factors include friends. Has been feeling a little better this past week.   Allergies  Allergen Reactions  . Codeine     REACTION: nausea  . Hydrocodone-Acetaminophen   . Morphine And Related Nausea Only    Intense nausea     Current Outpatient Prescriptions on File Prior to Visit  Medication Sig Dispense Refill  . alendronate (FOSAMAX) 70 MG tablet Take 70 mg by mouth once a week. Take with a full glass of water  on an empty stomach.    . ALPRAZolam (XANAX) 0.25 MG tablet Take 1 tablet (0.25 mg total) by mouth 2 (two) times daily as needed for anxiety or sleep. 60 tablet 5  . Cholecalciferol (VITAMIN D3) 1000 UNITS CAPS Take by mouth daily.      . DiphenhydrAMINE HCl, Sleep, (SLEEP TABS II PO) Take 1 tablet by mouth at bedtime.    . Multiple Vitamins-Minerals (MACULAR VITAMIN BENEFIT) TABS Take 2 tablets by mouth daily.    . polyethylene glycol powder (MIRALAX) powder Take 17 g by mouth 2 (two) times a week.     No current facility-administered medications on file prior to visit.     Review of Systems  Constitutional: Positive for fatigue. Negative for fever and chills.  Respiratory: Negative for chest tightness, shortness of breath and wheezing.   Cardiovascular: Negative for chest pain, palpitations and leg swelling.  Psychiatric/Behavioral: Positive for suicidal ideas, sleep disturbance and dysphoric mood. Negative for hallucinations, behavioral problems, self-injury, decreased concentration and agitation. The patient is not nervous/anxious.       Objective:    BP 128/72 mmHg  Pulse 71  Temp(Src) 98.1 F (36.7 C) (Oral)  Resp 14  Ht 5' 4.5" (1.638 m)  Wt 118 lb (53.524 kg)  BMI 19.95 kg/m2  SpO2 95% Nursing note and vital signs reviewed.  Physical Exam  Constitutional: She is oriented to person, place, and time. She appears well-developed and well-nourished. No distress.  Cardiovascular: Normal rate, regular rhythm, normal heart sounds  and intact distal pulses.   Pulmonary/Chest: Effort normal and breath sounds normal.  Neurological: She is alert and oriented to person, place, and time.  Skin: Skin is warm and dry.  Psychiatric: She has a normal mood and affect. Her behavior is normal. Judgment and thought content normal.  Appears tired and fatigued.        Assessment & Plan:   Problem List Items Addressed This Visit      Other   Caregiver with fatigue - Primary    Symptoms  and exam consistent with caregiver role strain as she is attempting to take care of her mother, herself, and her grandson to help her daughter. Does not wish to harm herself, but repeats the past was not good. Discussed treatment options including counseling which was declined and medication. Follow risks, side effects and proper usage discussion will start brief course of mirtazipine to help with sleep and depression. She is making positive changes in her life which should decrease her stress tremendously. She was advised to seek further care if symptoms worsen or she has a plan for suicide. Follow up in 1 month or sooner if needed.           I am having Ms. Poulter start on mirtazapine. I am also having her maintain her Vitamin D3, (DiphenhydrAMINE HCl, Sleep, (SLEEP TABS II PO)), polyethylene glycol powder, MACULAR VITAMIN BENEFIT, alendronate, and ALPRAZolam.   Meds ordered this encounter  Medications  . mirtazapine (REMERON) 15 MG tablet    Sig: Take 1 tablet (15 mg total) by mouth at bedtime.    Dispense:  30 tablet    Refill:  0    Order Specific Question:  Supervising Provider    Answer:  Pricilla Holm A [id number]     Follow-up: Return in about 1 month (around 04/30/2016), or if symptoms worsen or fail to improve.  Mauricio Po, FNP

## 2016-03-30 NOTE — Patient Instructions (Signed)
Thank you for choosing [facility].  Summary/Instructions:  Your prescription(s) have been submitted to your pharmacy or been printed and provided for you. Please take as directed and contact our office if you believe you are having problem(s) with the medication(s) or have any questions.  If your symptoms worsen or fail to improve, please contact our office for further instruction, or in case of emergency go directly to the emergency room at the closest medical facility.   Mirtazapine tablets What is this medicine? MIRTAZAPINE (mir TAZ a peen) is used to treat depression. This medicine may be used for other purposes; ask your health care provider or pharmacist if you have questions. What should I tell my health care provider before I take this medicine? They need to know if you have any of these conditions: -bipolar disorder -glaucoma -kidney disease -liver disease -suicidal thoughts -an unusual or allergic reaction to mirtazapine, other medicines, foods, dyes, or preservatives -pregnant or trying to get pregnant -breast-feeding How should I use this medicine? Take this medicine by mouth with a glass of water. Follow the directions on the prescription label. Take your medicine at regular intervals. Do not take your medicine more often than directed. Do not stop taking this medicine suddenly except upon the advice of your doctor. Stopping this medicine too quickly may cause serious side effects or your condition may worsen. A special MedGuide will be given to you by the pharmacist with each prescription and refill. Be sure to read this information carefully each time. Talk to your pediatrician regarding the use of this medicine in children. Special care may be needed. Overdosage: If you think you have taken too much of this medicine contact a poison control center or emergency room at once. NOTE: This medicine is only for you. Do not share this medicine with others. What if I miss  a dose? If you miss a dose, take it as soon as you can. If it is almost time for your next dose, take only that dose. Do not take double or extra doses. What may interact with this medicine? Do not take this medicine with any of the following medications: -linezolid -MAOIs like Carbex, Eldepryl, Marplan, Nardil, and Parnate -methylene blue (injected into a vein) This medicine may also interact with the following medications: -alcohol -antiviral medicines for HIV or AIDS -certain medicines that treat or prevent blood clots like warfarin -certain medicines for depression, anxiety, or psychotic disturbances -certain medicines for fungal infections like ketoconazole and itraconazole -certain medicines for migraine headache like almotriptan, eletriptan, frovatriptan, naratriptan, rizatriptan, sumatriptan, zolmitriptan -certain medicines for seizures like carbamazepine or phenytoin -certain medicines for sleep -cimetidine -erythromycin -fentanyl -lithium -medicines for blood pressure -nefazodone -rasagiline -rifampin -supplements like St. John's wort, kava kava, valerian -tramadol -tryptophan This list may not describe all possible interactions. Give your health care provider a list of all the medicines, herbs, non-prescription drugs, or dietary supplements you use. Also tell them if you smoke, drink alcohol, or use illegal drugs. Some items may interact with your medicine. What should I watch for while using this medicine? Tell your doctor if your symptoms do not get better or if they get worse. Visit your doctor or health care professional for regular checks on your progress. Because it may take several weeks to see the full effects of this medicine, it is important to continue your treatment as prescribed by your doctor. Patients and their families should watch out for new or worsening thoughts of suicide or depression. Also watch   out for sudden changes in feelings such as feeling anxious,  agitated, panicky, irritable, hostile, aggressive, impulsive, severely restless, overly excited and hyperactive, or not being able to sleep. If this happens, especially at the beginning of treatment or after a change in dose, call your health care professional. Dennis Bast may get drowsy or dizzy. Do not drive, use machinery, or do anything that needs mental alertness until you know how this medicine affects you. Do not stand or sit up quickly, especially if you are an older patient. This reduces the risk of dizzy or fainting spells. Alcohol may interfere with the effect of this medicine. Avoid alcoholic drinks. This medicine may cause dry eyes and blurred vision. If you wear contact lenses you may feel some discomfort. Lubricating drops may help. See your eye doctor if the problem does not go away or is severe. Your mouth may get dry. Chewing sugarless gum or sucking hard candy, and drinking plenty of water may help. Contact your doctor if the problem does not go away or is severe. What side effects may I notice from receiving this medicine? Side effects that you should report to your doctor or health care professional as soon as possible: -allergic reactions like skin rash, itching or hives, swelling of the face, lips, or tongue -breathing problems -confusion -fever, sore throat, or mouth ulcers or blisters -flu like symptoms including fever, chills, cough, muscle or joint aches and pains -stomach pain with nausea and/or vomiting -suicidal thoughts or other mood changes -swelling of the hands or feet -unusual bleeding or bruising -unusually weak or tired -vomiting Side effects that usually do not require medical attention (report to your doctor or health care professional if they continue or are bothersome): -constipation -increased appetite -weight gain This list may not describe all possible side effects. Call your doctor for medical advice about side effects. You may report side effects to FDA at  1-800-FDA-1088. Where should I keep my medicine? Keep out of the reach of children. Store at room temperature between 15 and 30 degrees C (59 and 86 degrees F) Protect from light and moisture. Throw away any unused medicine after the expiration date. NOTE: This sheet is a summary. It may not cover all possible information. If you have questions about this medicine, talk to your doctor, pharmacist, or health care provider.    2016, [employer]vier/Gold Standard. (2013-05-11 13:11:19)

## 2016-03-30 NOTE — Assessment & Plan Note (Signed)
Symptoms and exam consistent with caregiver role strain as she is attempting to take care of her mother, herself, and her grandson to help her daughter. Does not wish to harm herself, but repeats the past was not good. Discussed treatment options including counseling which was declined and medication. Follow risks, side effects and proper usage discussion will start brief course of mirtazipine to help with sleep and depression. She is making positive changes in her life which should decrease her stress tremendously. She was advised to seek further care if symptoms worsen or she has a plan for suicide. Follow up in 1 month or sooner if needed.

## 2016-03-30 NOTE — Progress Notes (Signed)
Pre visit review using our clinic review tool, if applicable. No additional management support is needed unless otherwise documented below in the visit note. 

## 2016-03-31 ENCOUNTER — Encounter: Payer: Self-pay | Admitting: Gastroenterology

## 2016-04-29 ENCOUNTER — Encounter: Payer: Self-pay | Admitting: Family

## 2016-04-29 ENCOUNTER — Ambulatory Visit (INDEPENDENT_AMBULATORY_CARE_PROVIDER_SITE_OTHER): Payer: BC Managed Care – PPO | Admitting: Family

## 2016-04-29 ENCOUNTER — Ambulatory Visit: Payer: BC Managed Care – PPO | Admitting: Family

## 2016-04-29 VITALS — BP 122/74 | HR 66 | Temp 97.8°F | Resp 16 | Ht 64.5 in | Wt 122.0 lb

## 2016-04-29 DIAGNOSIS — R5383 Other fatigue: Secondary | ICD-10-CM

## 2016-04-29 MED ORDER — MIRTAZAPINE 15 MG PO TABS: 15.0000 mg | ORAL_TABLET | Freq: Every day | ORAL | Status: DC

## 2016-04-29 NOTE — Progress Notes (Signed)
Pre visit review using our clinic review tool, if applicable. No additional management support is needed unless otherwise documented below in the visit note. 

## 2016-04-29 NOTE — Patient Instructions (Addendum)
Thank you for choosing  HealthCare.  Summary/Instructions:  Please continue to take your medication as prescribed.   Your prescription(s) have been submitted to your pharmacy or been printed and provided for you. Please take as directed and contact our office if you believe you are having problem(s) with the medication(s) or have any questions.  If your symptoms worsen or fail to improve, please contact our office for further instruction, or in case of emergency go directly to the emergency room at the closest medical facility.     

## 2016-04-29 NOTE — Progress Notes (Signed)
Subjective:    Patient ID: [name], female    DOB: 1951/10/22, 65 y.o.   MRN: [id number]  Chief Complaint  Patient presents with  . Follow-up    states that she has been sleeping much better with the remeron    HPI:  ARABELLE Armstrong is a 65 y.o. 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 and presents today for a follow up office visit.   Caregiver fatigue - Recently seen in the office for depression with associated insomnia and started on mirtazapine. Reports taking the medication as prescribed and denies adverse side effects. Notes that her symptoms are greatly improved since the last visit and is averaging about 7-8 hours of sleep per night. Denies any thoughts of suicide.   Allergies  Allergen Reactions  . Codeine     REACTION: nausea  . Hydrocodone-Acetaminophen   . Morphine And Related Nausea Only    Intense nausea     Current Outpatient Prescriptions on File Prior to Visit  Medication Sig Dispense Refill  . alendronate (FOSAMAX) 70 MG tablet Take 70 mg by mouth once a week. Take with a full glass of water on an empty stomach.    . ALPRAZolam (XANAX) 0.25 MG tablet Take 1 tablet (0.25 mg total) by mouth 2 (two) times daily as needed for anxiety or sleep. 60 tablet 5  . Cholecalciferol (VITAMIN D3) 1000 UNITS CAPS Take by mouth daily.      . Multiple Vitamins-Minerals (MACULAR VITAMIN BENEFIT) TABS Take 2 tablets by mouth daily.    . polyethylene glycol powder (MIRALAX) powder Take 17 g by mouth 2 (two) times a week.     No current facility-administered medications on file prior to visit.     Review of Systems  Constitutional: Negative for fever and chills.  Respiratory: Negative for chest tightness and shortness of  breath.   Cardiovascular: Negative for chest pain, palpitations and leg swelling.  Psychiatric/Behavioral: Negative for sleep disturbance and dysphoric mood. The patient is not nervous/anxious.       Objective:    BP 122/74 mmHg  Pulse 66  Temp(Src) 97.8 F (36.6 C) (Oral)  Resp 16  Ht 5' 4.5" (1.638 m)  Wt 122 lb (55.339 kg)  BMI 20.63 kg/m2  SpO2 96% Nursing note and vital signs reviewed.  Physical Exam  Constitutional: She is oriented to person, place, and time. She appears well-developed and well-nourished. No distress.  Cardiovascular: Normal rate, regular rhythm, normal heart sounds and intact distal pulses.   Pulmonary/Chest: Effort normal and breath sounds normal.  Neurological: She is alert and oriented to person, place, and time.  Skin: Skin is warm and dry.  Psychiatric: She has a normal mood and affect. Her behavior is normal. Judgment and thought content normal. Her mood appears not anxious. She does not exhibit a depressed mood.       Assessment & Plan:   Problem List Items Addressed This Visit      Other   Caregiver with fatigue - Primary    Symptoms significantly improved since starting the medication and working on stress and stress management. No adverse side effects noted. Denies suicidal ideation. Continue current dosage of mirtazapine. Follow up in 2 months for CBC for therapeutic drug monitoring.       Relevant Medications   mirtazapine (  REMERON) 15 MG tablet       I have discontinued Ms. Monteleone's (DiphenhydrAMINE HCl, Sleep, (SLEEP TABS II PO)). I am also having her maintain her Vitamin D3, polyethylene glycol powder, MACULAR VITAMIN BENEFIT, alendronate, ALPRAZolam, and mirtazapine.   Meds ordered this encounter  Medications  . mirtazapine (REMERON) 15 MG tablet    Sig: Take 1 tablet (15 mg total) by mouth at bedtime.    Dispense:  90 tablet    Refill:  0    Order Specific Question:  Supervising Provider    Answer:  Pricilla Holm A  [id number]     Follow-up: Return in about 2 months (around 06/29/2016), or if symptoms worsen or fail to improve.  Mauricio Po, FNP

## 2016-04-29 NOTE — Assessment & Plan Note (Signed)
Symptoms significantly improved since starting the medication and working on stress and stress management. No adverse side effects noted. Denies suicidal ideation. Continue current dosage of mirtazapine. Follow up in 2 months for CBC for therapeutic drug monitoring.

## 2016-05-28 ENCOUNTER — Ambulatory Visit (AMBULATORY_SURGERY_CENTER): Payer: Self-pay

## 2016-05-28 VITALS — Ht 64.5 in | Wt 123.0 lb

## 2016-05-28 DIAGNOSIS — Z800 Family history of malignant neoplasm of digestive organs: Secondary | ICD-10-CM

## 2016-05-28 MED ORDER — NA SULFATE-K SULFATE-MG SULF 17.5-3.13-1.6 GM/177ML PO SOLN: 1.0000 | Freq: Once | ORAL | 0 refills | Status: AC

## 2016-05-28 NOTE — Progress Notes (Signed)
No egg or soy allergy.  No previous complications from anesthesia. No home O2. No diet meds. 

## 2016-06-11 ENCOUNTER — Encounter: Payer: Self-pay | Admitting: Gastroenterology

## 2016-06-11 ENCOUNTER — Ambulatory Visit (AMBULATORY_SURGERY_CENTER): Payer: Medicare Other | Admitting: Gastroenterology

## 2016-06-11 VITALS — BP 108/66 | HR 213 | Temp 98.2°F | Resp 12 | Ht 64.5 in | Wt 123.0 lb

## 2016-06-11 DIAGNOSIS — K635 Polyp of colon: Secondary | ICD-10-CM | POA: Diagnosis not present

## 2016-06-11 DIAGNOSIS — D125 Benign neoplasm of sigmoid colon: Secondary | ICD-10-CM

## 2016-06-11 DIAGNOSIS — Z800 Family history of malignant neoplasm of digestive organs: Secondary | ICD-10-CM

## 2016-06-11 DIAGNOSIS — Z1211 Encounter for screening for malignant neoplasm of colon: Secondary | ICD-10-CM | POA: Diagnosis present

## 2016-06-11 MED ORDER — SODIUM CHLORIDE 0.9 % IV SOLN: 500.0000 mL | INTRAVENOUS | Status: DC

## 2016-06-11 NOTE — Progress Notes (Signed)
To recobvery, reporyt yto [place], RN, VSS

## 2016-06-11 NOTE — Patient Instructions (Signed)
YOU HAD AN ENDOSCOPIC PROCEDURE TODAY AT [facility]:   Refer to the procedure report that was given to you for any specific questions about what was found during the examination.  If the procedure report does not answer your questions, please call your gastroenterologist to clarify.  If you requested that your care partner not be given the details of your procedure findings, then the procedure report has been included in a sealed envelope for you to review at your convenience later.  YOU SHOULD EXPECT: Some feelings of bloating in the abdomen. Passage of more gas than usual.  Walking can help get rid of the air that was put into your GI tract during the procedure and reduce the bloating. If you had a lower endoscopy (such as a colonoscopy or flexible sigmoidoscopy) you may notice spotting of blood in your stool or on the toilet paper. If you underwent a bowel prep for your procedure, you may not have a normal bowel movement for a few days.  Please Note:  You might notice some irritation and congestion in your nose or some drainage.  This is from the oxygen used during your procedure.  There is no need for concern and it should clear up in a day or so.  SYMPTOMS TO REPORT IMMEDIATELY:   Following lower endoscopy (colonoscopy or flexible sigmoidoscopy):  Excessive amounts of blood in the stool  Significant tenderness or worsening of abdominal pains  Swelling of the abdomen that is new, acute  Fever of 100F or higher    For urgent or emergent issues, a gastroenterologist can be reached at any hour by calling [phone].   DIET: Your first meal following the procedure should be a small meal and then it is ok to progress to your normal diet. Heavy or fried foods are harder to digest and may make you feel nauseous or bloated.  Likewise, meals heavy in dairy and vegetables can increase bloating.  Drink plenty of fluids but you should avoid alcoholic beverages for 24  hours.  ACTIVITY:  You should plan to take it easy for the rest of today and you should NOT DRIVE or use heavy machinery until tomorrow (because of the sedation medicines used during the test).    FOLLOW UP: Our staff will call the number listed on your records the next business day following your procedure to check on you and address any questions or concerns that you may have regarding the information given to you following your procedure. If we do not reach you, we will leave a message.  However, if you are feeling well and you are not experiencing any problems, there is no need to return our call.  We will assume that you have returned to your regular daily activities without incident.  If any biopsies were taken you will be contacted by phone or by letter within the next 1-3 weeks.  Please call us at [phone] if you have not heard about the biopsies in 3 weeks.    SIGNATURES/CONFIDENTIALITY: You and/or your care partner have signed paperwork which will be entered into your electronic medical record.  These signatures attest to the fact that that the information above on your After Visit Summary has been reviewed and is understood.  Full responsibility of the confidentiality of this discharge information lies with you and/or your care-partner.  Information on polyps given to you today  Await pathology report

## 2016-06-11 NOTE — Progress Notes (Signed)
Called to room to assist during endoscopic procedure.  Patient ID and intended procedure confirmed with present staff. Received instructions for my participation in the procedure from the performing physician.  

## 2016-06-11 NOTE — Op Note (Signed)
[facility] Patient Name: [name] Procedure Date: 06/11/2016 9:54 AM MRN: [id number] Endoscopist: Milus Banister , MD Age: 65 Referring MD:  Date of Birth: 05-25-1951 Gender: Female Account #: [id number] Procedure:                Colonoscopy Indications:              Screening patient at increased risk: Family history                            of 1st-degree relative with colorectal cancer at                            age 47 years (or older); mother had colon cancer,                            diagnosed age 69 Medicines:                Monitored Anesthesia Care Procedure:                Pre-Anesthesia Assessment:                           - Prior to the procedure, a History and Physical                            was performed, and patient medications and                            allergies were reviewed. The patient's tolerance of                            previous anesthesia was also reviewed. The risks                            and benefits of the procedure and the sedation                            options and risks were discussed with the patient.                            All questions were answered, and informed consent                            was obtained. Prior Anticoagulants: The patient has                            taken no previous anticoagulant or antiplatelet                            agents. ASA Grade Assessment: II - A patient with                            mild systemic disease. After reviewing the risks  and benefits, the patient was deemed in                            satisfactory condition to undergo the procedure.                           After obtaining informed consent, the colonoscope                            was passed under direct vision. Throughout the                            procedure, the patient's blood pressure, pulse, and                            oxygen saturations were monitored  continuously. The                            Model CF-HQ190L [id number] scope was introduced                            through the anus and advanced to the the cecum,                            identified by appendiceal orifice and ileocecal                            valve. The colonoscopy was performed without                            difficulty. The patient tolerated the procedure                            well. The quality of the bowel preparation was                            excellent. The ileocecal valve, appendiceal                            orifice, and rectum were photographed. Scope In: 10:00:56 AM Scope Out: 10:10:56 AM Scope Withdrawal Time: 0 hours 5 minutes 6 seconds  Total Procedure Duration: 0 hours 10 minutes 0 seconds  Findings:                 A 3 mm polyp was found in the sigmoid colon. The                            polyp was sessile. The polyp was removed with a                            cold snare. Resection and retrieval were complete.                           The exam was otherwise without abnormality on  direct views. Narrow rectal vault did not allow for                            retroflex view of the the anus however very good                            direct views were noted. Complications:            No immediate complications. Estimated blood loss:                            None. Estimated Blood Loss:     Estimated blood loss: none. Impression:               - One 3 mm polyp in the sigmoid colon, removed with                            a cold snare. Resected and retrieved.                           - The examination was otherwise normal. Recommendation:           - Patient has a contact number available for                            emergencies. The signs and symptoms of potential                            delayed complications were discussed with the                            patient. Return to normal activities  tomorrow.                            Written discharge instructions were provided to the                            patient.                           - Resume previous diet.                           - Continue present medications.                           You will receive a letter within 2-3 weeks with the                            pathology results and my final recommendations.                           If the polyp(s) is proven to be 'pre-cancerous' on                            pathology, you will  need repeat colonoscopy in 5                            years. If the polyp(s) is NOT 'precancerous' on                            pathology then you should repeat colon cancer                            screening in 10 years with colonoscopy without need                            for colon cancer screening by any method prior to                            then (including stool testing). Milus Banister, MD 06/11/2016 10:14:47 AM This report has been signed electronically.

## 2016-06-14 ENCOUNTER — Ambulatory Visit (INDEPENDENT_AMBULATORY_CARE_PROVIDER_SITE_OTHER): Payer: Medicare Other | Admitting: Family

## 2016-06-14 ENCOUNTER — Encounter: Payer: Self-pay | Admitting: Family

## 2016-06-14 ENCOUNTER — Telehealth: Payer: Self-pay

## 2016-06-14 VITALS — BP 110/60 | HR 75 | Temp 98.0°F | Resp 16 | Ht 64.5 in | Wt 121.0 lb

## 2016-06-14 DIAGNOSIS — Z0000 Encounter for general adult medical examination without abnormal findings: Secondary | ICD-10-CM

## 2016-06-14 DIAGNOSIS — Z131 Encounter for screening for diabetes mellitus: Secondary | ICD-10-CM | POA: Insufficient documentation

## 2016-06-14 DIAGNOSIS — Z23 Encounter for immunization: Secondary | ICD-10-CM

## 2016-06-14 NOTE — Telephone Encounter (Signed)
  Follow up Call-  Call back number 06/11/2016  Post procedure Call Back phone  # [phone]  Permission to leave phone message Yes  Some recent data might be hidden     Patient questions:  Do you have a fever, pain , or abdominal swelling? No. Pain Score  0 *  Have you tolerated food without any problems? Yes.    Have you been able to return to your normal activities? Yes.    Do you have any questions about your discharge instructions: Diet   No. Medications  No. Follow up visit  No.  Do you have questions or concerns about your Care? No.  Actions: * If pain score is 4 or above: No action needed, pain <4.

## 2016-06-14 NOTE — Progress Notes (Signed)
Subjective:    Patient ID: [name], female    DOB: 1951-07-11, 65 y.o.   MRN: [id number]  Chief Complaint  Patient presents with  . CPE    not fasting    HPI:  [name] is a 65 y.o. female who presents today for an annual wellness visit.   1) Health Maintenance -   Diet - Averages about 3 meals per day consisting of a regular diet; occasional fast/processed foods; Caffeine intake 2-3 cups per day  Exercise - 10x per month to the to the [facility] with cardiovascular exercise primarily.   2) Preventative Exams / Immunizations:  Dental -- Up to date  Vision -- Up to date   Health Maintenance  Topic Date Due  . PNA vac Low Risk Adult (1 of 2 - PCV13) 05/26/2016  . INFLUENZA VACCINE  06/01/2016  . MAMMOGRAM  02/23/2017  . PAP SMEAR  04/23/2019  . COLONOSCOPY  06/11/2021  . TETANUS/TDAP  11/07/2023  . DEXA SCAN  Completed  . ZOSTAVAX  Completed  . Hepatitis C Screening  Completed  . HIV Screening  Addressed     Immunization History  Administered Date(s) Administered  . Influenza Split 10/12/2011, 07/27/2013  . Influenza,inj,Quad PF,36+ Mos 07/18/2014  . Influenza-Unspecified 07/31/2015  . Pneumococcal Conjugate-13 06/14/2016  . Td 11/02/2003  . Tdap 11/06/2013  . Zoster 03/28/2013    RISK FACTORS  Tobacco History  Smoking Status  . Never Smoker  Smokeless Tobacco  . Never Used     Cardiac risk factors: advanced age (older than 65 for men, 79 for women) and dyslipidemia.  Depression Screen  Q1: Over the past two weeks, have you felt down, depressed or hopeless? No  Q2: Over the past two weeks, have you felt little interest or pleasure in doing things? No  Have you lost interest or pleasure in daily life? No  Do you often feel hopeless? No  Do you cry easily over simple problems? No  Activities of Daily Living In your present state of health, do you have any difficulty performing the following activities?:  Driving? No Managing money?   No Feeding yourself? No Getting from bed to chair? No Climbing a flight of stairs? No Preparing food and eating?: No Bathing or showering? No Getting dressed: No Getting to the toilet? No Using the toilet: No Moving around from place to place: No In the past year have you fallen or had a near fall?:No   Home Safety Has smoke detector and wears seat belts. No firearms. No excess sun exposure. Are there smokers in your home (other than you)?  No Do you feel safe at home?  Yes  Hearing Difficulties: No Do you often ask people to speak up or repeat themselves? No Do you experience ringing or noises in your ears? No  Do you have difficulty understanding soft or whispered voices? No    Cognitive Testing  Alert? Yes   Normal Appearance? Yes  Oriented to person? Yes  Place? Yes   Time? Yes  Recall of three objects?  Yes  Can perform simple calculations? Yes  Displays appropriate judgment? Yes  Can read the correct time from a watch face? Yes  Do you feel that you have a problem with memory? No  Do you often misplace items? No   Advanced Directives have been discussed with the patient? Yes  Current Physicians/Providers and Suppliers  1. Terri Piedra, FNP - Internal Medicine 2. Alonza Bogus, MD - Neurology  3. Owens Loffler, MD - Gastroenterology   Indicate any recent Medical Services you may have received from other than [facility] providers in the past year (date may be approximate).  All answers were reviewed with the patient and necessary referrals were made:  Mauricio Po, [place]   06/14/2016    Allergies  Allergen Reactions  . Codeine Nausea Only    REACTION: nausea  . Hydrocodone-Acetaminophen Nausea Only  . Morphine And Related Nausea Only    Intense nausea     Outpatient Medications Prior to Visit  Medication Sig Dispense Refill  . alendronate (FOSAMAX) 70 MG tablet Take 70 mg by mouth once a week. Take with a full glass of water on an empty stomach.    .  ALPRAZolam (XANAX) 0.25 MG tablet Take 1 tablet (0.25 mg total) by mouth 2 (two) times daily as needed for anxiety or sleep. 60 tablet 5  . Cholecalciferol (VITAMIN D3) 1000 UNITS CAPS Take by mouth daily.      . mirtazapine (REMERON) 15 MG tablet Take 1 tablet (15 mg total) by mouth at bedtime. 90 tablet 0  . Multiple Vitamins-Minerals (MACULAR VITAMIN BENEFIT) TABS Take 2 tablets by mouth daily.    . polyethylene glycol powder (MIRALAX) powder Take 17 g by mouth 2 (two) times a week.     Facility-Administered Medications Prior to Visit  Medication Dose Route Frequency Provider Last Rate Last Dose  . 0.9 %  sodium chloride infusion  500 mL Intravenous Continuous Milus Banister, MD         Past Medical History:  Diagnosis Date  . Actinic keratosis   . ALLERGIC RHINITIS   . ANEMIA-NOS   . Anxiety   . Cataract 2014   left cataract extraction with lens implant  . DEPRESSION   . FIBROIDS, UTERUS   . HYPERLIPIDEMIA   . Macular degeneration of both eyes 2012   mild  . 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 Neuromuscular disorder ([place]) 2014   hand tremors  . OSTEOPOROSIS 2002   on bisphos  . Sarcoidosis ([place]) 2009     Past Surgical History:  Procedure Laterality Date  . BREAST SURGERY     biospy  . CATARACT EXTRACTION Left 07/2013   dr Herbert Deaner  . Child birth  75  . COLONOSCOPY  2011  . STOMACH SURGERY  11/2008   2-3cm GIST excision  . TONSILLECTOMY  1957  . TUBAL LIGATION    . UPPER GASTROINTESTINAL ENDOSCOPY       Family History  Problem Relation Age of Onset  . Allergies Mother   . Colon cancer Mother   . Allergies Sister   . Allergies Daughter   . Breast cancer Maternal Aunt   . Prostate cancer Maternal Uncle   . Esophageal cancer Maternal Grandfather   . Stomach cancer Neg Hx      Social History   Social History  . Marital status: Single     Spouse name: N/A  . Number of children: N/A  . Years of education: N/A   Occupational History  . retired Retired    Publishing copy   Social History Main Topics  . Smoking status: Never Smoker  . Smokeless tobacco: Never Used  . Alcohol use 1.8 oz/week    3 Glasses of wine per week  . Drug use: No  . Sexual activity: Not on file  Other Topics Concern  . Not on file   Social History Narrative   Divorced, lives alone. Pt has 1 dtr. 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 HENT: Head: Denies headache or neck pain Ears: Denies changes in hearing, ringing in ears, earache, drainage Nose: Denies discharge, stuffiness, itching, nosebleed, sinus pain Throat: Denies sore throat, hoarseness, dry mouth, sores, thrush Eyes: Denies loss/changes in vision, pain, redness, blurry/double vision, flashing lights Cardiovascular: Denies chest pain/discomfort, tightness, palpitations, shortness of breath with activity, difficulty lying down, swelling, sudden awakening with shortness of breath Respiratory: Denies shortness of breath, cough, sputum production, wheezing Gastrointestinal: Denies dysphasia, heartburn, change in appetite, nausea, change in bowel habits, rectal bleeding, constipation, diarrhea, yellow skin or eyes Genitourinary: Denies frequency, urgency, burning/pain, blood in urine, incontinence, change in urinary strength. Musculoskeletal: Denies muscle/joint pain, stiffness, back pain, redness or swelling of joints, trauma Skin: Denies rashes, lumps, itching, dryness, color changes, or hair/nail changes Neurological: Denies dizziness, fainting, seizures, weakness, numbness, tingling, tremor Psychiatric - Denies nervousness, stress, depression or memory loss Endocrine: Denies heat or cold intolerance, sweating, frequent urination, excessive thirst, changes in  appetite Hematologic: Denies ease of bruising or bleeding    Objective:    BP 110/60 (BP Location: Left Arm, Patient Position: Sitting, Cuff Size: Normal)   Pulse 75   Temp 98 F (36.7 C) (Oral)   Resp 16   Ht 5' 4.5" (1.638 m)   Wt 121 lb (54.9 kg)   SpO2 96%   BMI 20.45 kg/m  Nursing note and vital signs reviewed.   Visual Acuity Screening   Right eye Left eye Both eyes  Without correction:     With correction: 20/25 20/20 20/15     Physical Exam  Constitutional: She is oriented to person, place, and time. She appears well-developed and well-nourished.  HENT:  Head: Normocephalic.  Right Ear: Hearing, tympanic membrane, external ear and ear canal normal.  Left Ear: Hearing, tympanic membrane, external ear and ear canal normal.  Nose: Nose normal.  Mouth/Throat: Uvula is midline, oropharynx is clear and moist and mucous membranes are normal.  Eyes: Conjunctivae and EOM are normal. Pupils are equal, round, and reactive to light.  Neck: Neck supple. No JVD present. No tracheal deviation present. No thyromegaly present.  Cardiovascular: Normal rate, regular rhythm, normal heart sounds and intact distal pulses.   Pulmonary/Chest: Effort normal and breath sounds normal.  Abdominal: Soft. Bowel sounds are normal. She exhibits no distension and no mass. There is no tenderness. There is no rebound and no guarding.  Musculoskeletal: Normal range of motion. She exhibits no edema or tenderness.  Lymphadenopathy:    She has no cervical adenopathy.  Neurological: She is alert and oriented to person, place, and time. She has normal reflexes. No cranial nerve deficit. She exhibits normal muscle tone. Coordination normal.  Skin: Skin is warm and dry.  Psychiatric: She has a normal mood and affect. Her behavior is normal. Judgment and thought content normal.       Assessment & Plan:   During the course of the visit the patient was educated and counseled about appropriate screening and  preventive services including:    Pneumococcal vaccine   Influenza vaccine  Td vaccine  Colorectal cancer screening  Diabetes screening  Nutrition counseling   Diet review for nutrition referral? Yes ____  Not Indicated _X___   Patient Instructions (the written  plan) was given to the patient.  Medicare Attestation I have personally reviewed: The patient's medical and social history Their use of alcohol, tobacco or illicit drugs Their current medications and supplements The patient's functional ability including ADLs,fall risks, home safety risks, cognitive, and hearing and visual impairment Diet and physical activities Evidence for depression or mood disorders  The patient's weight, height, BMI,  have been recorded in the chart.  I have made referrals, counseling, and provided education to the patient based on review of the above and I have provided the patient with a written personalized care plan for preventive services.     Mauricio Po, [place]   06/14/2016    Problem List Items Addressed This Visit      Other   Medicare annual wellness visit, initial    Reviewed and updated patient's medical, surgical, family and social history. Medications and allergies were also reviewed. Basic screenings for depression, activities of daily living, hearing, cognition and safety were performed. Provider list was updated and health plan was provided to the patient.       Routine general medical examination at a health care facility    1) Anticipatory Guidance: Discussed importance of wearing a seatbelt while driving and not texting while driving; changing batteries in smoke detector at least once annually; wearing suntan lotion when outside; eating a balanced and moderate diet; getting physical activity at least 30 minutes per day.  2) Immunizations / Screenings / Labs:  Prevnar updated today. All other immunizations are up to date per recommendations. Obtain Vitamin D for Vitamin D  screening and osteoporosis. All other screenings are up to date per recommendations. Obtain CBC, CMET, and Lipid profile.   Overall well exam with risk factors for cardiovascular disease including hyperlipidemia. Currently managed through lifestyle. She is of good weight for her height. Eats a balanced, varied and moderated diet. Continue healthy lifestyle behaviors and choices. Follow up prevention exam in 1 year. Follow up office visit pending blood work as needed.        Relevant Orders   CBC   Comprehensive metabolic panel   Lipid panel   VITAMIN D 25 Hydroxy (Vit-D Deficiency, Fractures)    Other Visit Diagnoses    Need for vaccination with 13-polyvalent pneumococcal conjugate vaccine    -  Primary   Relevant Orders   Pneumococcal conjugate vaccine 13-valent IM (Completed)

## 2016-06-14 NOTE — Assessment & Plan Note (Signed)
1) Anticipatory Guidance: Discussed importance of wearing a seatbelt while driving and not texting while driving; changing batteries in smoke detector at least once annually; wearing suntan lotion when outside; eating a balanced and moderate diet; getting physical activity at least 30 minutes per day.  2) Immunizations / Screenings / Labs:  Prevnar updated today. All other immunizations are up to date per recommendations. Obtain Vitamin D for Vitamin D screening and osteoporosis. All other screenings are up to date per recommendations. Obtain CBC, CMET, and Lipid profile.   Overall well exam with risk factors for cardiovascular disease including hyperlipidemia. Currently managed through lifestyle. She is of good weight for her height. Eats a balanced, varied and moderated diet. Continue healthy lifestyle behaviors and choices. Follow up prevention exam in 1 year. Follow up office visit pending blood work as needed.

## 2016-06-14 NOTE — Patient Instructions (Addendum)
Thank you for choosing [facility].  Summary/Instructions:  Your prescription(s) have been submitted to your pharmacy or been printed and provided for you. Please take as directed and contact our office if you believe you are having problem(s) with the medication(s) or have any questions.  Please stop by the lab on the lower level of the building for your blood work. Your results will be released to [name] (or called to you) after review, usually within 72 hours after test completion. If any changes need to be made, you will be notified at that same time.  1. The lab is open from 7:30am to 5:30 pm Monday-Friday  2. No appointment is necessary  3. Fasting (if needed) is 6-8 hours after food and drink; [place]  coffee and water are okay    Health Maintenance  Topic Date Due  . PNA vac Low Risk Adult (1 of 2 - PCV13) 05/26/2016  . INFLUENZA VACCINE  06/01/2016  . MAMMOGRAM  02/23/2017  . PAP SMEAR  04/23/2019  . COLONOSCOPY  06/11/2021  . TETANUS/TDAP  11/07/2023  . DEXA SCAN  Completed  . ZOSTAVAX  Completed  . Hepatitis C Screening  Completed  . HIV Screening  Addressed     Menopause is a normal process in which your reproductive ability comes to an end. This process happens gradually over a span of months to years, usually between the ages of 67 and 76. Menopause is complete when you have missed 12 consecutive menstrual periods. It is important to talk with your health care provider about some of the most common conditions that affect postmenopausal women, such as heart disease, cancer, and bone loss (osteoporosis). Adopting a healthy lifestyle and getting preventive care can help to promote your health and wellness. Those actions can also lower your chances of developing some of these common conditions. WHAT SHOULD I KNOW ABOUT MENOPAUSE? During menopause, you may experience a number of symptoms, such as:  Moderate-to-severe hot flashes.  Night sweats.  Decrease in sex  drive.  Mood swings.  Headaches.  Tiredness.  Irritability.  Memory problems.  Insomnia. Choosing to treat or not to treat menopausal changes is an individual decision that you make with your health care provider. WHAT SHOULD I KNOW ABOUT HORMONE REPLACEMENT THERAPY AND SUPPLEMENTS? Hormone therapy products are effective for treating symptoms that are associated with menopause, such as hot flashes and night sweats. Hormone replacement carries certain risks, especially as you become older. If you are thinking about using estrogen or estrogen with progestin treatments, discuss the benefits and risks with your health care provider. WHAT SHOULD I KNOW ABOUT HEART DISEASE AND STROKE? Heart disease, heart attack, and stroke become more likely as you age. This may be due, in part, to the hormonal changes that your body experiences during menopause. These can affect how your body processes dietary fats, triglycerides, and cholesterol. Heart attack and stroke are both medical emergencies. There are many things that you can do to help prevent heart disease and stroke:  Have your blood pressure checked at least every 1-2 years. High blood pressure causes heart disease and increases the risk of stroke.  If you are 48-31 years old, ask your health care provider if you should take aspirin to prevent a heart attack or a stroke.  Do not use any tobacco products, including cigarettes, chewing tobacco, or electronic cigarettes. If you need help quitting, ask your health care provider.  It is important to eat a healthy diet and maintain a healthy  weight.  Be sure to include plenty of vegetables, fruits, low-fat dairy products, and lean protein.  Avoid eating foods that are high in solid fats, added sugars, or salt (sodium).  Get regular exercise. This is one of the most important things that you can do for your health.  Try to exercise for at least 150 minutes each week. The type of exercise that you  do should increase your heart rate and make you sweat. This is known as moderate-intensity exercise.  Try to do strengthening exercises at least twice each week. Do these in addition to the moderate-intensity exercise.  Know your numbers.Ask your health care provider to check your cholesterol and your blood glucose. Continue to have your blood tested as directed by your health care provider. WHAT SHOULD I KNOW ABOUT CANCER SCREENING? There are several types of cancer. Take the following steps to reduce your risk and to catch any cancer development as early as possible. Breast Cancer  Practice breast self-awareness.  This means understanding how your breasts normally appear and feel.  It also means doing regular breast self-exams. Let your health care provider know about any changes, no matter how small.  If you are 41 or older, have a clinician do a breast exam (clinical breast exam or CBE) every year. Depending on your age, family history, and medical history, it may be recommended that you also have a yearly breast X-ray (mammogram).  If you have a family history of breast cancer, talk with your health care provider about genetic screening.  If you are at high risk for breast cancer, talk with your health care provider about having an MRI and a mammogram every year.  Breast cancer (BRCA) gene test is recommended for women who have family members with BRCA-related cancers. Results of the assessment will determine the need for genetic counseling and BRCA1 and for BRCA2 testing. BRCA-related cancers include these types:  Breast. This occurs in males or females.  Ovarian.  Tubal. This may also be called fallopian tube cancer.  Cancer of the abdominal or pelvic lining (peritoneal cancer).  Prostate.  Pancreatic. Cervical, Uterine, and Ovarian Cancer Your health care provider may recommend that you be screened regularly for cancer of the pelvic organs. These include your ovaries,  uterus, and vagina. This screening involves a pelvic exam, which includes checking for microscopic changes to the surface of your cervix (Pap test).  For women ages 21-65, health care providers may recommend a pelvic exam and a Pap test every three years. For women ages 22-65, they may recommend the Pap test and pelvic exam, combined with testing for human papilloma virus (HPV), every five years. Some types of HPV increase your risk of cervical cancer. Testing for HPV may also be done on women of any age who have unclear Pap test results.  Other health care providers may not recommend any screening for nonpregnant women who are considered low risk for pelvic cancer and have no symptoms. Ask your health care provider if a screening pelvic exam is right for you.  If you have had past treatment for cervical cancer or a condition that could lead to cancer, you need Pap tests and screening for cancer for at least 20 years after your treatment. If Pap tests have been discontinued for you, your risk factors (such as having a new sexual partner) need to be reassessed to determine if you should start having screenings again. Some women have medical problems that increase the chance of getting cervical cancer.  In these cases, your health care provider may recommend that you have screening and Pap tests more often.  If you have a family history of uterine cancer or ovarian cancer, talk with your health care provider about genetic screening.  If you have vaginal bleeding after reaching menopause, tell your health care provider.  There are currently no reliable tests available to screen for ovarian cancer. Lung Cancer Lung cancer screening is recommended for adults 75-56 years old who are at high risk for lung cancer because of a history of smoking. A yearly low-dose CT scan of the lungs is recommended if you:  Currently smoke.  Have a history of at least 30 pack-years of smoking and you currently smoke or have  quit within the past 15 years. A pack-year is smoking an average of one pack of cigarettes per day for one year. Yearly screening should:  Continue until it has been 15 years since you quit.  Stop if you develop a health problem that would prevent you from having lung cancer treatment. Colorectal Cancer  This type of cancer can be detected and can often be prevented.  Routine colorectal cancer screening usually begins at age 53 and continues through age 93.  If you have risk factors for colon cancer, your health care provider may recommend that you be screened at an earlier age.  If you have a family history of colorectal cancer, talk with your health care provider about genetic screening.  Your health care provider may also recommend using home test kits to check for hidden blood in your stool.  A small camera at the end of a tube can be used to examine your colon directly (sigmoidoscopy or colonoscopy). This is done to check for the earliest forms of colorectal cancer.  Direct examination of the colon should be repeated every 5-10 years until age 20. However, if early forms of precancerous polyps or small growths are found or if you have a family history or genetic risk for colorectal cancer, you may need to be screened more often. Skin Cancer  Check your skin from head to toe regularly.  Monitor any moles. Be sure to tell your health care provider:  About any new moles or changes in moles, especially if there is a change in a mole's shape or color.  If you have a mole that is larger than the size of a pencil eraser.  If any of your family members has a history of skin cancer, especially at a young age, talk with your health care provider about genetic screening.  Always use sunscreen. Apply sunscreen liberally and repeatedly throughout the day.  Whenever you are outside, protect yourself by wearing long sleeves, pants, a wide-brimmed hat, and sunglasses. WHAT SHOULD I KNOW  ABOUT OSTEOPOROSIS? Osteoporosis is a condition in which bone destruction happens more quickly than new bone creation. After menopause, you may be at an increased risk for osteoporosis. To help prevent osteoporosis or the bone fractures that can happen because of osteoporosis, the following is recommended:  If you are 22-30 years old, get at least 1,000 mg of calcium and at least 600 mg of vitamin D per day.  If you are older than age 74 but younger than age 56, get at least 1,200 mg of calcium and at least 600 mg of vitamin D per day.  If you are older than age 33, get at least 1,200 mg of calcium and at least 800 mg of vitamin D per day. Smoking and  excessive alcohol intake increase the risk of osteoporosis. Eat foods that are rich in calcium and vitamin D, and do weight-bearing exercises several times each week as directed by your health care provider. WHAT SHOULD I KNOW ABOUT HOW MENOPAUSE AFFECTS [place]? Depression may occur at any age, but it is more common as you become older. Common symptoms of depression include:  Low or sad mood.  Changes in sleep patterns.  Changes in appetite or eating patterns.  Feeling an overall lack of motivation or enjoyment of activities that you previously enjoyed.  Frequent crying spells. Talk with your health care provider if you think that you are experiencing depression. WHAT SHOULD I KNOW ABOUT IMMUNIZATIONS? It is important that you get and maintain your immunizations. These include:  Tetanus, diphtheria, and pertussis (Tdap) booster vaccine.  Influenza every year before the flu season begins.  Pneumonia vaccine.  Shingles vaccine. Your health care provider may also recommend other immunizations.   This information is not intended to replace advice given to you by your health care provider. Make sure you discuss any questions you have with your health care provider.   Document Released: 12/10/2005 Document Revised: 11/08/2014  Document Reviewed: 06/20/2014 Elsevier Interactive Patient Education [employer].

## 2016-06-14 NOTE — Assessment & Plan Note (Signed)
Reviewed and updated patient's medical, surgical, family and social history. Medications and allergies were also reviewed. Basic screenings for depression, activities of daily living, hearing, cognition and safety were performed. Provider list was updated and health plan was provided to the patient.  

## 2016-06-15 NOTE — Progress Notes (Signed)
Subjective:   [name] was seen in f/u in the movement disorder clinic at the request of Mauricio Po, [place].  The f/u is for essential tremor.  The patient is a 65 y.o. right handed female with a history of tremor.Onset of symptoms was in high school. Tremor primarily involves the bilateral hand.  The tremor is symmetric.   She notices the right hand more than the L.   Symptoms are currently of moderate severity. Tremor exacerbated by stress.  She is not sure if caffeine will affect tremor (she drinks 2 cups of tea per day).  She rarely drinks soda and no coffee in the home.   She has not noted improvement with alcohol.  She has trouble with soup.  She will try to use 2 hands to eat or drink her soup.  She has trouble pouring liquids.    She never notices the tremor at rest.  She has no voice tremor.  She has good balance.  Her mother has tremor as well.  She may have noted a head tremor a few times.  The patient was recently started on 08/09/2012 on Propranolol LA 60 mg daily.  Last visit, we tried to increase the dose to twice a day dosing but she had significant fatigue with that.  She is not taking it twice a day every other day and on the opposite day she is just taking it once a day.  She thinks that it has helped some but not significantly.  She states that she quit working out and has really had poor motivation to get out of the house.  She is not sure that it is from the medication.  She states she has not been eating right lately either.  She has gained a few pounds.  06/17/16 update:  The patient is seen back in the neurology clinic today, but I have not seen her in nearly 4 years.  She reports worsening of tremor over that time.  Tremor is located in both hands equally and is most evident when she uses the hand.  She notices it especially when writing a check in public.  If using plastic utensils or having to eat soup, it is not good.  Her mother has tremor but is 57 y/o and pts tremor is  worse than mothers.  She is R hand dominant.  She has tried propranolol, but is unable to tolerate higher dosages, and lower dosages have not been efficacious.  When I saw her nearly 4 years ago, she had just tried primidone but had nausea with it and "just felt bad" and couldn't continue it more than a few days before she d/c it.  Reports today that she thinks that she would like to try the propranolol again but she didn't remember the SE that she had in the past until I read her old notes to her.  Current/Previously tried tremor medications: propranolol LA and primidone (SE at low dosages for both)  Current medications that may exacerbate tremor:  none   Allergies  Allergen Reactions  . Codeine Nausea Only    REACTION: nausea  . Hydrocodone-Acetaminophen Nausea Only  . Morphine And Related Nausea Only    Intense nausea    Current Outpatient Prescriptions on File Prior to Visit  Medication Sig Dispense Refill  . alendronate (FOSAMAX) 70 MG tablet Take 70 mg by mouth once a week. Take with a full glass of water on an empty stomach.    . ALPRAZolam Duanne Moron)  0.25 MG tablet Take 1 tablet (0.25 mg total) by mouth 2 (two) times daily as needed for anxiety or sleep. 60 tablet 5  . Cholecalciferol (VITAMIN D3) 1000 UNITS CAPS Take by mouth daily.      . mirtazapine (REMERON) 15 MG tablet Take 1 tablet (15 mg total) by mouth at bedtime. 90 tablet 0  . Multiple Vitamins-Minerals (MACULAR VITAMIN BENEFIT) TABS Take 2 tablets by mouth daily.    . polyethylene glycol powder (MIRALAX) powder Take 17 g by mouth 2 (two) times a week.     Current Facility-Administered Medications on File Prior to Visit  Medication Dose Route Frequency Provider Last Rate Last Dose  . 0.9 %  sodium chloride infusion  500 mL Intravenous Continuous Milus Banister, MD        Past Medical History:  Diagnosis Date  . Actinic keratosis   . ALLERGIC RHINITIS   . ANEMIA-NOS   . Anxiety   . Cataract 2014   left cataract  extraction with lens implant  . DEPRESSION   . FIBROIDS, UTERUS   . HYPERLIPIDEMIA   . Macular degeneration of both eyes 2012   mild  . 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 Neuromuscular disorder ([place]) 2014   hand tremors  . OSTEOPOROSIS 2002   on bisphos  . Sarcoidosis ([place]) 2009    Past Surgical History:  Procedure Laterality Date  . BREAST SURGERY     biospy  . CATARACT EXTRACTION Left 07/2013   dr Herbert Deaner  . Child birth  57  . COLONOSCOPY  2011  . STOMACH SURGERY  11/2008   2-3cm GIST excision  . TONSILLECTOMY  1957  . TUBAL LIGATION    . UPPER GASTROINTESTINAL ENDOSCOPY      Social History   Social History  . Marital status: Single    Spouse name: N/A  . Number of children: N/A  . Years of education: N/A   Occupational History  . retired Retired    Publishing copy   Social History Main Topics  . Smoking status: Never Smoker  . Smokeless tobacco: Never Used  . Alcohol use 1.8 - 2.4 oz/week    3 - 4 Glasses of wine per week  . Drug use: No  . Sexual activity: Not on file   Other Topics Concern  . Not on file   Social History Narrative   Divorced, lives alone. 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 Mother Alive   colorectal CA  . Father Deceased   hodgkins disease  . Sister Alive   healthy  . Daughter   . Maternal Aunt   . Maternal Uncle   . Maternal Grandfather   . Neg Hx     Review of Systems   A complete 10 system ROS was obtained and was negative apart from what is mentioned.   Objective:   VITALS:   Vitals:   06/17/16 0938  BP: 94/60  BP Location: Right Arm  Patient Position: Sitting  Cuff Size: Normal  Pulse: 75  Weight: 121 lb (54.9 kg)  Height: 5' 4.5" (1.638 m)   Gen:  Appears stated age and in NAD. HEENT:  Normocephalic, atraumatic. The mucous  membranes are moist. The superficial temporal arteries are without ropiness or tenderness. Cardiovascular: Regular rate and rhythm. Lungs:  Clear to auscultation bilaterally. Neck: There are no carotid bruits noted bilaterally.  NEUROLOGICAL:  Orientation:  The patient is alert and oriented x 3.  Recent and remote memory are intact.  Attention span and concentration are normal.  Able to name objects and repeat without trouble.  Fund of knowledge is appropriate Cranial nerves: There is good facial symmetry. . Extraocular muscles are intact and visual fields are full to confrontational testing. Speech is fluent and clear. Soft palate rises symmetrically and there is no tongue deviation. Hearing is intact to conversational tone. Tone: Tone is good throughout. Sensation: Sensation is intact to light touch throughout. Coordination:  The patient has no dysdiadichokinesia or dysmetria. Motor: Strength is 5/5 in the bilateral upper and lower extremities.  Shoulder shrug is equal bilaterally.  There is no pronator drift.  There are no fasciculations noted. DTR's: Deep tendon reflexes are 2-/4 at the bilateral biceps, triceps, brachioradialis, patella and achilles.  Plantar responses are downgoing bilaterally. Gait and Station: The patient is able to ambulate without difficulty.   MOVEMENT EXAM: Tremor:  There is moderate tremor in the RUE, especially noted when I asked her to pour water from one glass to another.  She spills water all over with both hands.   She has a mild to mod tremor of the left hand.  With distraction techniques, she will have a resting component to the RUE tremor.  There is an intermittent tremor, very mild in the head.   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  Essential tremor.   This is evidenced by longstanding history and most evident with activation and some with intention.  There is also a fam hx of tremor in her mother.   She has a mild resting component on the R but this can be seen in longstanding ET.  She has no bradykinesia.  She has been unable to tolerate effective dosages of Inderal and was intolerant of primidone.  She asks me about retrying propranolol but her BP is just too low for that.  Talked to her about the purely anticholinergic drugs, such as Artane, which carry risks in her age group.  Talked to her about surgical options.  After a very long discussion, she decided to retry primidone and discussed that first dose effect certainly can be an issue yet again.  Will start primidone 50 mg, 1/2 tablet for 1-2 weeks q hs and then increase to 1 po q hs.  Risks, benefits, side effects and alternative therapies were discussed.  The opportunity to ask questions was given and they were answered to the best of my ability.  The patient expressed understanding and willingness to follow the outlined treatment protocols.

## 2016-06-16 ENCOUNTER — Other Ambulatory Visit (INDEPENDENT_AMBULATORY_CARE_PROVIDER_SITE_OTHER): Payer: Medicare Other

## 2016-06-16 ENCOUNTER — Encounter: Payer: Self-pay | Admitting: Family

## 2016-06-16 DIAGNOSIS — Z0000 Encounter for general adult medical examination without abnormal findings: Secondary | ICD-10-CM | POA: Diagnosis not present

## 2016-06-16 LAB — LIPID PANEL
CHOL/HDL RATIO: 5
CHOLESTEROL: 254 mg/dL — AB (ref 0–200)
HDL: 49.4 mg/dL (ref >39.00)
LDL Cholesterol: 178 mg/dL — ABNORMAL HIGH (ref 0–99)
NonHDL: 205.08
TRIGLYCERIDES: 135 mg/dL (ref 0.0–149.0)
VLDL: 27 mg/dL (ref 0.0–40.0)

## 2016-06-16 LAB — COMPREHENSIVE METABOLIC PANEL
ALBUMIN: 4.3 g/dL (ref 3.5–5.2)
ALT: 15 U/L (ref 0–35)
AST: 17 U/L (ref 0–37)
Alkaline Phosphatase: 43 U/L (ref 39–117)
BILIRUBIN TOTAL: 0.7 mg/dL (ref 0.2–1.2)
BUN: 11 mg/dL (ref 6–23)
CALCIUM: 9.7 mg/dL (ref 8.4–10.5)
CHLORIDE: 105 meq/L (ref 96–112)
CO2: 30 meq/L (ref 19–32)
Creatinine, Ser: 0.78 mg/dL (ref 0.40–1.20)
GFR: 78.77 mL/min (ref >60.00)
Glucose, Bld: 96 mg/dL (ref 70–99)
Potassium: 4.9 mEq/L (ref 3.5–5.1)
Sodium: 142 mEq/L (ref 135–145)
Total Protein: 7.3 g/dL (ref 6.0–8.3)

## 2016-06-16 LAB — CBC
HCT: 45.6 % (ref 36.0–46.0)
HEMOGLOBIN: 15.3 g/dL — AB (ref 12.0–15.0)
MCHC: 33.6 g/dL (ref 30.0–36.0)
MCV: 91.9 fl (ref 78.0–100.0)
PLATELETS: 257 10*3/uL (ref 150.0–400.0)
RBC: 4.96 Mil/uL (ref 3.87–5.11)
RDW: 13.8 % (ref 11.5–15.5)
WBC: 7.2 10*3/uL (ref 4.0–10.5)

## 2016-06-16 LAB — VITAMIN D 25 HYDROXY (VIT D DEFICIENCY, FRACTURES): VITD: 39.06 ng/mL (ref 30.00–100.00)

## 2016-06-17 ENCOUNTER — Ambulatory Visit (INDEPENDENT_AMBULATORY_CARE_PROVIDER_SITE_OTHER): Payer: Medicare Other | Admitting: Neurology

## 2016-06-17 ENCOUNTER — Encounter: Payer: Self-pay | Admitting: Neurology

## 2016-06-17 VITALS — BP 94/60 | HR 75 | Ht 64.5 in | Wt 121.0 lb

## 2016-06-17 DIAGNOSIS — G250 Essential tremor: Secondary | ICD-10-CM

## 2016-06-17 MED ORDER — PRIMIDONE 50 MG PO TABS: 50.0000 mg | ORAL_TABLET | Freq: Every day | ORAL | 3 refills | Status: DC

## 2016-06-17 NOTE — Patient Instructions (Signed)
1. Start Primidone 50 mg - 1/2 tablet at bedtime for 2 weeks, then increase to 1 tablet at bedtime.

## 2016-06-18 ENCOUNTER — Encounter: Payer: Self-pay | Admitting: Gastroenterology

## 2016-07-22 ENCOUNTER — Other Ambulatory Visit: Payer: Self-pay | Admitting: *Deleted

## 2016-07-22 DIAGNOSIS — R5383 Other fatigue: Secondary | ICD-10-CM

## 2016-07-22 MED ORDER — MIRTAZAPINE 15 MG PO TABS: 15.0000 mg | ORAL_TABLET | Freq: Every day | ORAL | 1 refills | Status: DC

## 2016-07-22 NOTE — Telephone Encounter (Signed)
Rec'd call pt states she is needing refill on the Mirtazapine. Verified pharmacy inform will send to [facility]...[name]

## 2016-09-20 NOTE — Progress Notes (Signed)
Subjective:   [name] was seen in f/u in the movement disorder clinic at the request of Mauricio Po, [place].  The f/u is for essential tremor.  The patient is a 65 y.o. right handed female with a history of tremor.Onset of symptoms was in high school. Tremor primarily involves the bilateral hand.  The tremor is symmetric.   She notices the right hand more than the L.   Symptoms are currently of moderate severity. Tremor exacerbated by stress.  She is not sure if caffeine will affect tremor (she drinks 2 cups of tea per day).  She rarely drinks soda and no coffee in the home.   She has not noted improvement with alcohol.  She has trouble with soup.  She will try to use 2 hands to eat or drink her soup.  She has trouble pouring liquids.    She never notices the tremor at rest.  She has no voice tremor.  She has good balance.  Her mother has tremor as well.  She may have noted a head tremor a few times.  The patient was recently started on 08/09/2012 on Propranolol LA 60 mg daily.  Last visit, we tried to increase the dose to twice a day dosing but she had significant fatigue with that.  She is not taking it twice a day every other day and on the opposite day she is just taking it once a day.  She thinks that it has helped some but not significantly.  She states that she quit working out and has really had poor motivation to get out of the house.  She is not sure that it is from the medication.  She states she has not been eating right lately either.  She has gained a few pounds.  06/17/16 update:  The patient is seen back in the neurology clinic today, but I have not seen her in nearly 4 years.  She reports worsening of tremor over that time.  Tremor is located in both hands equally and is most evident when she uses the hand.  She notices it especially when writing a check in public.  If using plastic utensils or having to eat soup, it is not good.  Her mother has tremor but is 62 y/o and pts tremor is  worse than mothers.  She is R hand dominant.  She has tried propranolol, but is unable to tolerate higher dosages, and lower dosages have not been efficacious.  When I saw her nearly 4 years ago, she had just tried primidone but had nausea with it and "just felt bad" and couldn't continue it more than a few days before she d/c it.  Reports today that she thinks that she would like to try the propranolol again but she didn't remember the SE that she had in the past until I read her old notes to her.  09/21/16 update:  The patient follows up today.  We started her on primidone last visit.  We started it very slowly and she is only on 50 mg at night.  No SE this time.  Tremor may be a little better but not enough better.    Current/Previously tried tremor medications: propranolol LA and primidone (SE at low dosages for both)  Current medications that may exacerbate tremor:  none   Allergies  Allergen Reactions  . Codeine Nausea Only    REACTION: nausea  . Hydrocodone-Acetaminophen Nausea Only  . Morphine And Related Nausea Only  Intense nausea    Current Outpatient Prescriptions on File Prior to Visit  Medication Sig Dispense Refill  . alendronate (FOSAMAX) 70 MG tablet Take 70 mg by mouth once a week. Take with a full glass of water on an empty stomach.    . ALPRAZolam (XANAX) 0.25 MG tablet Take 1 tablet (0.25 mg total) by mouth 2 (two) times daily as needed for anxiety or sleep. 60 tablet 5  . Cholecalciferol (VITAMIN D3) 1000 UNITS CAPS Take by mouth daily.      . mirtazapine (REMERON) 15 MG tablet Take 1 tablet (15 mg total) by mouth at bedtime. 90 tablet 1  . Multiple Vitamins-Minerals (MACULAR VITAMIN BENEFIT) TABS Take 2 tablets by mouth daily.    . polyethylene glycol powder (MIRALAX) powder Take 17 g by mouth 3 (three) times a week.     . primidone (MYSOLINE) 50 MG tablet Take 1 tablet (50 mg total) by mouth at bedtime. 30 tablet 3   Current Facility-Administered Medications on  File Prior to Visit  Medication Dose Route Frequency Provider Last Rate Last Dose  . 0.9 %  sodium chloride infusion  500 mL Intravenous Continuous Milus Banister, MD        Past Medical History:  Diagnosis Date  . Actinic keratosis   . ALLERGIC RHINITIS   . ANEMIA-NOS   . Anxiety   . Cataract 2014   left cataract extraction with lens implant  . DEPRESSION   . FIBROIDS, UTERUS   . HYPERLIPIDEMIA   . Macular degeneration of both eyes 2012   mild  . 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 Neuromuscular disorder ([place]) 2014   hand tremors  . OSTEOPOROSIS 2002   on bisphos  . Sarcoidosis ([place]) 2009    Past Surgical History:  Procedure Laterality Date  . BREAST SURGERY     biospy  . CATARACT EXTRACTION Left 07/2013   dr Herbert Deaner  . Child birth  76  . COLONOSCOPY  2011  . STOMACH SURGERY  11/2008   2-3cm GIST excision  . TONSILLECTOMY  1957  . TUBAL LIGATION    . UPPER GASTROINTESTINAL ENDOSCOPY      Social History   Social History  . Marital status: Single    Spouse name: N/A  . Number of children: N/A  . Years of education: N/A   Occupational History  . retired Retired    Publishing copy   Social History Main Topics  . Smoking status: Never Smoker  . Smokeless tobacco: Never Used  . Alcohol use 1.8 - 2.4 oz/week    3 - 4 Glasses of wine per week  . Drug use: No  . Sexual activity: Not on file   Other Topics Concern  . Not on file   Social History Narrative   Divorced, lives alone. 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 Mother Alive   colorectal CA  . Father Deceased   hodgkins disease  . Sister Alive   healthy  . Daughter   . Maternal Aunt   . Maternal Uncle   . Maternal Grandfather   . Neg Hx     Review of Systems   A complete 10 system ROS was obtained and was  negative apart from what is mentioned.   Objective:   VITALS:  Vitals:   09/21/16 1038  BP: 130/76  Pulse: 73  Weight: 122 lb (55.3 kg)  Height: 5' 4.5" (1.638 m)   Gen:  Appears stated age and in NAD. HEENT:  Normocephalic, atraumatic. The mucous membranes are moist. The superficial temporal arteries are without ropiness or tenderness. Cardiovascular: Regular rate and rhythm. Lungs: Clear to auscultation bilaterally. Neck: There are no carotid bruits noted bilaterally.  NEUROLOGICAL:  Orientation:  The patient is alert and oriented x 3.  Recent and remote memory are intact.  Attention span and concentration are normal.  Able to name objects and repeat without trouble.  Fund of knowledge is appropriate Cranial nerves: There is good facial symmetry.  Speech is fluent and clear. Soft palate rises symmetrically and there is no tongue deviation. Hearing is intact to conversational tone. Tone: Tone is good throughout. Sensation: Sensation is intact to light touch throughout. Coordination:  The patient has no dysdiadichokinesia or dysmetria. Motor: Strength is 5/5 in the bilateral upper and lower extremities.  Shoulder shrug is equal bilaterally.  There is no pronator drift.  There are no fasciculations noted. Gait and Station: The patient is able to ambulate without difficulty.   MOVEMENT EXAM: Tremor:  There is mild tremor of the outstretched hands.  Archimedes spirals are somewhat improved when compared to last visit.    LABS:  Lab Results  Component Value Date   TSH 2.67 10/28/2015     Chemistry      Component Value Date/Time   NA 142 06/16/2016 0828   K 4.9 06/16/2016 0828   CL 105 06/16/2016 0828   CO2 30 06/16/2016 0828   BUN 11 06/16/2016 0828   CREATININE 0.78 06/16/2016 0828      Component Value Date/Time   CALCIUM 9.7 06/16/2016 0828   ALKPHOS 43 06/16/2016 0828   AST 17 06/16/2016 0828   ALT 15 06/16/2016 0828   BILITOT 0.7 06/16/2016 0828        Assessment/Plan   1.  Essential tremor.   This is evidenced by longstanding history and most evident with activation and some with intention.  There is also a fam hx of tremor in her mother.   She has a mild resting component on the R but this can be seen in longstanding ET.  She has no bradykinesia.  She has been unable to tolerate effective dosages of Inderal.  We'll slowly increase primidone to 50 mg twice a day.  Risks, benefits, side effects and alternative therapies were discussed.  The opportunity to ask questions was given and they were answered to the best of my ability.  The patient expressed understanding and willingness to follow the outlined treatment protocols.

## 2016-09-21 ENCOUNTER — Ambulatory Visit (INDEPENDENT_AMBULATORY_CARE_PROVIDER_SITE_OTHER): Payer: Medicare Other | Admitting: Neurology

## 2016-09-21 ENCOUNTER — Encounter: Payer: Self-pay | Admitting: Neurology

## 2016-09-21 VITALS — BP 130/76 | HR 73 | Ht 64.5 in | Wt 122.0 lb

## 2016-09-21 DIAGNOSIS — G250 Essential tremor: Secondary | ICD-10-CM | POA: Diagnosis not present

## 2016-09-21 MED ORDER — PRIMIDONE 50 MG PO TABS
50.0000 mg | ORAL_TABLET | Freq: Two times a day (BID) | ORAL | 1 refills | Status: DC
Start: 2016-09-21 — End: 2017-03-07

## 2016-09-21 NOTE — Patient Instructions (Signed)
1. Increase Primidone to 1/2 tablet in the morning, 1 tablet in the evening for two weeks. Then 1 tablet in the morning, 1 in the evening.

## 2017-01-17 NOTE — Progress Notes (Signed)
Subjective:   [name] was seen in f/u in the movement disorder clinic at the request of Mauricio Po, [place].  The f/u is for essential tremor.  The patient is a 66 y.o. right handed female with a history of tremor.Onset of symptoms was in high school. Tremor primarily involves the bilateral hand.  The tremor is symmetric.   She notices the right hand more than the L.   Symptoms are currently of moderate severity. Tremor exacerbated by stress.  She is not sure if caffeine will affect tremor (she drinks 2 cups of tea per day).  She rarely drinks soda and no coffee in the home.   She has not noted improvement with alcohol.  She has trouble with soup.  She will try to use 2 hands to eat or drink her soup.  She has trouble pouring liquids.    She never notices the tremor at rest.  She has no voice tremor.  She has good balance.  Her mother has tremor as well.  She may have noted a head tremor a few times.  The patient was recently started on 08/09/2012 on Propranolol LA 60 mg daily.  Last visit, we tried to increase the dose to twice a day dosing but she had significant fatigue with that.  She is not taking it twice a day every other day and on the opposite day she is just taking it once a day.  She thinks that it has helped some but not significantly.  She states that she quit working out and has really had poor motivation to get out of the house.  She is not sure that it is from the medication.  She states she has not been eating right lately either.  She has gained a few pounds.  06/17/16 update:  The patient is seen back in the neurology clinic today, but I have not seen her in nearly 4 years.  She reports worsening of tremor over that time.  Tremor is located in both hands equally and is most evident when she uses the hand.  She notices it especially when writing a check in public.  If using plastic utensils or having to eat soup, it is not good.  Her mother has tremor but is 77 y/o and pts tremor is  worse than mothers.  She is R hand dominant.  She has tried propranolol, but is unable to tolerate higher dosages, and lower dosages have not been efficacious.  When I saw her nearly 4 years ago, she had just tried primidone but had nausea with it and "just felt bad" and couldn't continue it more than a few days before she d/c it.  Reports today that she thinks that she would like to try the propranolol again but she didn't remember the SE that she had in the past until I read her old notes to her.  09/21/16 update:  The patient follows up today.  We started her on primidone last visit.  We started it very slowly and she is only on 50 mg at night.  No SE this time.  Tremor may be a little better but not enough better.    01/19/17 update:  Patient follows up today.  She is now on primidone 50 mg twice per day.  She states that it is helping some but not enough and she would like to increase further.  She is having no SE.    Current/Previously tried tremor medications: propranolol LA and primidone (SE  at low dosages for both)  Current medications that may exacerbate tremor:  none   Allergies  Allergen Reactions  . Codeine Nausea Only    REACTION: nausea  . Hydrocodone-Acetaminophen Nausea Only  . Morphine And Related Nausea Only    Intense nausea    Current Outpatient Prescriptions on File Prior to Visit  Medication Sig Dispense Refill  . alendronate (FOSAMAX) 70 MG tablet Take 70 mg by mouth once a week. Take with a full glass of water on an empty stomach.    . ALPRAZolam (XANAX) 0.25 MG tablet Take 1 tablet (0.25 mg total) by mouth 2 (two) times daily as needed for anxiety or sleep. 60 tablet 5  . Cholecalciferol (VITAMIN D3) 1000 UNITS CAPS Take by mouth daily.      . mirtazapine (REMERON) 15 MG tablet Take 1 tablet (15 mg total) by mouth at bedtime. 90 tablet 1  . Multiple Vitamins-Minerals (MACULAR VITAMIN BENEFIT) TABS Take 2 tablets by mouth daily.    . polyethylene glycol powder  (MIRALAX) powder Take 17 g by mouth 3 (three) times a week.     . primidone (MYSOLINE) 50 MG tablet Take 1 tablet (50 mg total) by mouth 2 (two) times daily. 180 tablet 1   Current Facility-Administered Medications on File Prior to Visit  Medication Dose Route Frequency Provider Last Rate Last Dose  . 0.9 %  sodium chloride infusion  500 mL Intravenous Continuous Milus Banister, MD        Past Medical History:  Diagnosis Date  . Actinic keratosis   . ALLERGIC RHINITIS   . ANEMIA-NOS   . Anxiety   . Cataract 2014   left cataract extraction with lens implant  . DEPRESSION   . FIBROIDS, UTERUS   . HYPERLIPIDEMIA   . Macular degeneration of both eyes 2012   mild  . 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 Neuromuscular disorder ([place]) 2014   hand tremors  . OSTEOPOROSIS 2002   on bisphos  . Sarcoidosis ([place]) 2009    Past Surgical History:  Procedure Laterality Date  . BREAST SURGERY     biospy  . CATARACT EXTRACTION Left 07/2013   dr Herbert Deaner  . Child birth  32  . COLONOSCOPY  2011  . STOMACH SURGERY  11/2008   2-3cm GIST excision  . TONSILLECTOMY  1957  . TUBAL LIGATION    . UPPER GASTROINTESTINAL ENDOSCOPY      Social History   Social History  . Marital status: Single    Spouse name: N/A  . Number of children: N/A  . Years of education: N/A   Occupational History  . retired Retired    Publishing copy   Social History Main Topics  . Smoking status: Never Smoker  . Smokeless tobacco: Never Used  . Alcohol use 1.8 - 2.4 oz/week    3 - 4 Glasses of wine per week  . Drug use: No  . Sexual activity: Not on file   Other Topics Concern  . Not on file   Social History Narrative   Divorced, lives alone. 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 Mother Alive   colorectal CA  . Father  Deceased   hodgkins disease  . Sister Alive   healthy  . Daughter   . Maternal  Aunt   . Maternal Uncle   . Maternal Grandfather   . Neg Hx     Review of Systems   A complete 10 system ROS was obtained and was negative apart from what is mentioned.   Objective:   VITALS:   Vitals:   01/19/17 0937  BP: 120/64  Pulse: 85  SpO2: 98%  Weight: 122 lb (55.3 kg)  Height: 5' 4.5" (1.638 m)   Gen:  Appears stated age and in NAD. HEENT:  Normocephalic, atraumatic. The mucous membranes are moist. The superficial temporal arteries are without ropiness or tenderness. Cardiovascular: Regular rate and rhythm. Lungs: Clear to auscultation bilaterally. Neck: There are no carotid bruits noted bilaterally.  NEUROLOGICAL:  Orientation:  The patient is alert and oriented x 3.   Cranial nerves: There is good facial symmetry.  Speech is fluent and clear. Soft palate rises symmetrically and there is no tongue deviation. Hearing is intact to conversational tone. Tone: Tone is good throughout. Sensation: Sensation is intact to light touch throughout. Coordination:  The patient has no dysdiadichokinesia or dysmetria. Motor: Strength is 5/5 in the bilateral upper and lower extremities.  Shoulder shrug is equal bilaterally.  There is no pronator drift.  There are no fasciculations noted. Gait and Station: The patient is able to ambulate without difficulty.   MOVEMENT EXAM: Tremor:  There is mild to mod tremor of the outstretched hands.   It is not particularly worse when given a weight to hold.  It is the same in the proximal and distal position.  Tremor is evident with Archimedes spirals.  She has a mild resting tremor on the right.    LABS:  Lab Results  Component Value Date   TSH 2.67 10/28/2015     Chemistry      Component Value Date/Time   NA 142 06/16/2016 0828   K 4.9 06/16/2016 0828   CL 105 06/16/2016 0828   CO2 30 06/16/2016 0828   BUN 11 06/16/2016 0828   CREATININE 0.78  06/16/2016 0828      Component Value Date/Time   CALCIUM 9.7 06/16/2016 0828   ALKPHOS 43 06/16/2016 0828   AST 17 06/16/2016 0828   ALT 15 06/16/2016 0828   BILITOT 0.7 06/16/2016 0828       Assessment/Plan   1.  Essential tremor.   This is evidenced by longstanding history and most evident with activation and some with intention.  There is also a fam hx of tremor in her mother.   She has a mild resting component on the R but this can be seen in longstanding ET.  She has no bradykinesia.  She has been unable to tolerate effective dosages of Inderal.  We'll slowly increase primidone to 50 mg, 1 in the AM and 2 at night. Risks, benefits, side effects and alternative therapies were discussed.  The opportunity to ask questions was given and they were answered to the best of my ability.  The patient expressed understanding and willingness to follow the outlined treatment protocols.  I did talk to her at length about DBS therapy.  She is not sure she wants to go that far and wants to explore medication options first, which is completely appropriate.  I will see her back in the next few months.

## 2017-01-19 ENCOUNTER — Ambulatory Visit (INDEPENDENT_AMBULATORY_CARE_PROVIDER_SITE_OTHER): Payer: Medicare Other | Admitting: Neurology

## 2017-01-19 ENCOUNTER — Encounter: Payer: Self-pay | Admitting: Neurology

## 2017-01-19 VITALS — BP 120/64 | HR 85 | Ht 64.5 in | Wt 122.0 lb

## 2017-01-19 DIAGNOSIS — G250 Essential tremor: Secondary | ICD-10-CM | POA: Diagnosis not present

## 2017-01-21 ENCOUNTER — Other Ambulatory Visit: Payer: Self-pay | Admitting: Family

## 2017-01-21 DIAGNOSIS — R5383 Other fatigue: Secondary | ICD-10-CM

## 2017-03-07 ENCOUNTER — Telehealth: Payer: Self-pay | Admitting: Neurology

## 2017-03-07 MED ORDER — PRIMIDONE 50 MG PO TABS: ORAL_TABLET | ORAL | 1 refills | Status: DC

## 2017-03-07 NOTE — Telephone Encounter (Signed)
Prescription sent to pharmacy.

## 2017-03-07 NOTE — Telephone Encounter (Signed)
Caller: PT  Urgent? No  Reason for the call: PT called and said she needs a refill of Primidone with a new dosage and she said Dr Tat wanted her to call here and not the pharmacy

## 2017-04-26 NOTE — Progress Notes (Signed)
Subjective:   [name] was seen in f/u in the movement disorder clinic at the request of Golden Circle, FNP.  The f/u is for essential tremor.  The patient is a 66 y.o. right handed female with a history of tremor.Onset of symptoms was in high school. Tremor primarily involves the bilateral hand.  The tremor is symmetric.   She notices the right hand more than the L.   Symptoms are currently of moderate severity. Tremor exacerbated by stress.  She is not sure if caffeine will affect tremor (she drinks 2 cups of tea per day).  She rarely drinks soda and no coffee in the home.   She has not noted improvement with alcohol.  She has trouble with soup.  She will try to use 2 hands to eat or drink her soup.  She has trouble pouring liquids.    She never notices the tremor at rest.  She has no voice tremor.  She has good balance.  Her mother has tremor as well.  She may have noted a head tremor a few times.  The patient was recently started on 08/09/2012 on Propranolol LA 60 mg daily.  Last visit, we tried to increase the dose to twice a day dosing but she had significant fatigue with that.  She is not taking it twice a day every other day and on the opposite day she is just taking it once a day.  She thinks that it has helped some but not significantly.  She states that she quit working out and has really had poor motivation to get out of the house.  She is not sure that it is from the medication.  She states she has not been eating right lately either.  She has gained a few pounds.  06/17/16 update:  The patient is seen back in the neurology clinic today, but I have not seen her in nearly 4 years.  She reports worsening of tremor over that time.  Tremor is located in both hands equally and is most evident when she uses the hand.  She notices it especially when writing a check in public.  If using plastic utensils or having to eat soup, it is not good.  Her mother has tremor but is 81 y/o and pts tremor  is worse than mothers.  She is R hand dominant.  She has tried propranolol, but is unable to tolerate higher dosages, and lower dosages have not been efficacious.  When I saw her nearly 4 years ago, she had just tried primidone but had nausea with it and "just felt bad" and couldn't continue it more than a few days before she d/c it.  Reports today that she thinks that she would like to try the propranolol again but she didn't remember the SE that she had in the past until I read her old notes to her.  09/21/16 update:  The patient follows up today.  We started her on primidone last visit.  We started it very slowly and she is only on 50 mg at night.  No SE this time.  Tremor may be a little better but not enough better.    01/19/17 update:  Patient follows up today.  She is now on primidone 50 mg twice per day.  She states that it is helping some but not enough and she would like to increase further.  She is having no SE.    04/27/17 update:  Patient seen today in follow-up.  I increased her primidone last visit so that she was taking 50 mg in the morning and 100 mg at night.  She states that "it is probably a little better."  She denies any side effects with the medication.  No new medical problems or medications since last visit.  Current/Previously tried tremor medications: propranolol LA and primidone (SE at low dosages for both)  Current medications that may exacerbate tremor:  none   Allergies  Allergen Reactions  . Codeine Nausea Only    REACTION: nausea  . Hydrocodone-Acetaminophen Nausea Only  . Morphine And Related Nausea Only    Intense nausea    Current Outpatient Prescriptions on File Prior to Visit  Medication Sig Dispense Refill  . alendronate (FOSAMAX) 70 MG tablet Take 70 mg by mouth once a week. Take with a full glass of water on an empty stomach.    . ALPRAZolam (XANAX) 0.25 MG tablet Take 1 tablet (0.25 mg total) by mouth 2 (two) times daily as needed for anxiety or sleep.  60 tablet 5  . Cholecalciferol (VITAMIN D3) 1000 UNITS CAPS Take by mouth daily.      . mirtazapine (REMERON) 15 MG tablet take 1 tablet by mouth at bedtime 90 tablet 1  . Multiple Vitamins-Minerals (MACULAR VITAMIN BENEFIT) TABS Take 2 tablets by mouth daily.    . polyethylene glycol powder (MIRALAX) powder Take 17 g by mouth 3 (three) times a week.     . primidone (MYSOLINE) 50 MG tablet 1 tablet in the morning, 2 tablets at night 270 tablet 1   Current Facility-Administered Medications on File Prior to Visit  Medication Dose Route Frequency Provider Last Rate Last Dose  . 0.9 %  sodium chloride infusion  500 mL Intravenous Continuous Milus Banister, MD        Past Medical History:  Diagnosis Date  . Actinic keratosis   . ALLERGIC RHINITIS   . ANEMIA-NOS   . Anxiety   . Cataract 2014   left cataract extraction with lens implant  . DEPRESSION   . FIBROIDS, UTERUS   . HYPERLIPIDEMIA   . Macular degeneration of both eyes 2012   mild  . 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 Neuromuscular disorder ([place]) 2014   hand tremors  . OSTEOPOROSIS 2002   on bisphos  . Sarcoidosis 2009    Past Surgical History:  Procedure Laterality Date  . BREAST SURGERY     biospy  . CATARACT EXTRACTION Left 07/2013   dr Herbert Deaner  . Child birth  34  . COLONOSCOPY  2011  . STOMACH SURGERY  11/2008   2-3cm GIST excision  . TONSILLECTOMY  1957  . TUBAL LIGATION    . UPPER GASTROINTESTINAL ENDOSCOPY      Social History   Social History  . Marital status: Single    Spouse name: N/A  . Number of children: N/A  . Years of education: N/A   Occupational History  . retired Retired    Publishing copy   Social History Main Topics  . Smoking status: Never Smoker  . Smokeless tobacco: Never Used  . Alcohol use 1.8 - 2.4 oz/week    3 - 4 Glasses of wine per week  . Drug use: No   . Sexual activity: Not on file   Other Topics Concern  . Not on file   Social History Narrative   Divorced,  lives alone. 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 Mother Alive       colorectal CA  . Father Deceased       hodgkins disease  . Sister Alive       healthy  . Daughter (Not Specified)  . Mat Aunt (Not Specified)  . Mat Uncle (Not Specified)  . MGF (Not Specified)  . Neg Hx (Not Specified)    Review of Systems   A complete 10 system ROS was obtained and was negative apart from what is mentioned.   Objective:   VITALS:   Vitals:   04/27/17 0934  BP: 90/60  Pulse: 82  SpO2: 98%  Weight: 119 lb 6 oz (54.1 kg)  Height: 5\' 4"  (1.626 m)   Gen:  Appears stated age and in NAD. HEENT:  Normocephalic, atraumatic. The mucous membranes are moist. The superficial temporal arteries are without ropiness or tenderness. Cardiovascular: Regular rate and rhythm. Lungs: Clear to auscultation bilaterally. Neck: There are no carotid bruits noted bilaterally.  NEUROLOGICAL:  Orientation:  The patient is alert and oriented x 3.   Cranial nerves: There is good facial symmetry.  Speech is fluent and clear. Soft palate rises symmetrically and there is no tongue deviation. Hearing is intact to conversational tone. Tone: Tone is good throughout. Sensation: Sensation is intact to light touch throughout. Coordination:  The patient has no dysdiadichokinesia or dysmetria. Motor: Strength is 5/5 in the bilateral upper and lower extremities.  Shoulder shrug is equal bilaterally.  There is no pronator drift.  There are no fasciculations noted. Gait and Station: The patient is able to ambulate without difficulty.   MOVEMENT EXAM: Tremor:  There is mild to mod tremor of the outstretched hands.   It is not particularly worse when given a weight to hold.  It is the same in the proximal and distal position.  Tremor is evident with  Archimedes spirals.  She has a mild resting tremor on the right.    LABS:  Lab Results  Component Value Date   TSH 2.67 10/28/2015     Chemistry      Component Value Date/Time   NA 142 06/16/2016 0828   K 4.9 06/16/2016 0828   CL 105 06/16/2016 0828   CO2 30 06/16/2016 0828   BUN 11 06/16/2016 0828   CREATININE 0.78 06/16/2016 0828      Component Value Date/Time   CALCIUM 9.7 06/16/2016 0828   ALKPHOS 43 06/16/2016 0828   AST 17 06/16/2016 0828   ALT 15 06/16/2016 0828   BILITOT 0.7 06/16/2016 0828       Assessment/Plan   1.  Essential tremor.   This is evidenced by longstanding history and most evident with activation and some with intention.  There is also a fam hx of tremor in her mother.   She has a mild resting component on the R but this can be seen in longstanding ET.  She has no bradykinesia.  She has been unable to tolerate effective dosages of Inderal.  increase primidone 50 mg, 2 in the AM and 2 at night.  She will call when she needs a new refill. Risks, benefits, side effects and alternative therapies were discussed.  The opportunity to ask questions was given and they were answered to the best of my ability.  The patient expressed understanding and willingness to follow the outlined treatment protocols.  I  did talk to her at length about DBS therapy.  She is not sure she wants to go that far and wants to explore medication options first, which is completely appropriate.  I will see her back in the next few months.

## 2017-04-27 ENCOUNTER — Encounter: Payer: Self-pay | Admitting: Neurology

## 2017-04-27 ENCOUNTER — Ambulatory Visit (INDEPENDENT_AMBULATORY_CARE_PROVIDER_SITE_OTHER): Payer: Medicare Other | Admitting: Neurology

## 2017-04-27 VITALS — BP 90/60 | HR 82 | Ht 64.0 in | Wt 119.4 lb

## 2017-04-27 DIAGNOSIS — G250 Essential tremor: Secondary | ICD-10-CM

## 2017-04-27 NOTE — Patient Instructions (Signed)
Increase primidone 50 mg to 2 tablets in the AM and 2 tablets at night.  Call me when you need a refill of the medication  Make a follow up in 3-4 months.  Good to see you!

## 2017-05-24 ENCOUNTER — Telehealth: Payer: Self-pay | Admitting: Neurology

## 2017-05-24 MED ORDER — PRIMIDONE 50 MG PO TABS
100.0000 mg | ORAL_TABLET | Freq: Two times a day (BID) | ORAL | 1 refills | Status: DC
Start: 2017-05-24 — End: 2017-11-15

## 2017-05-24 NOTE — Telephone Encounter (Signed)
Patient made aware should be on Primidone 50 mg - 2 in the morning, 2 at night. Refill sent to pharmacy.

## 2017-05-24 NOTE — Telephone Encounter (Signed)
PT called and wants to know the dosage of her primidone, she said Dr Tat changed it and she is getting confused

## 2017-06-16 ENCOUNTER — Ambulatory Visit (HOSPITAL_COMMUNITY)
Admission: RE | Admit: 2017-06-16 | Discharge: 2017-06-16 | Disposition: A | Discharge status: Home / Self Care | Payer: Medicare Other | Source: Ambulatory Visit | Attending: Obstetrics | Admitting: Obstetrics

## 2017-06-29 ENCOUNTER — Ambulatory Visit (INDEPENDENT_AMBULATORY_CARE_PROVIDER_SITE_OTHER): Payer: Medicare Other | Admitting: Family

## 2017-06-29 ENCOUNTER — Encounter: Payer: Self-pay | Admitting: Family

## 2017-06-29 VITALS — BP 118/72 | HR 80 | Temp 97.9°F | Resp 16 | Ht 64.0 in | Wt 119.0 lb

## 2017-06-29 DIAGNOSIS — R5383 Other fatigue: Secondary | ICD-10-CM

## 2017-06-29 DIAGNOSIS — Z0000 Encounter for general adult medical examination without abnormal findings: Secondary | ICD-10-CM

## 2017-06-29 DIAGNOSIS — Z23 Encounter for immunization: Secondary | ICD-10-CM | POA: Diagnosis not present

## 2017-06-29 DIAGNOSIS — E782 Mixed hyperlipidemia: Secondary | ICD-10-CM | POA: Diagnosis not present

## 2017-06-29 MED ORDER — ALPRAZOLAM 0.25 MG PO TABS: 0.2500 mg | ORAL_TABLET | Freq: Two times a day (BID) | ORAL | 2 refills | Status: DC | PRN

## 2017-06-29 MED ORDER — MIRTAZAPINE 15 MG PO TABS: 15.0000 mg | ORAL_TABLET | Freq: Every day | ORAL | 1 refills | Status: DC

## 2017-06-29 NOTE — Progress Notes (Signed)
Subjective:    Patient ID: [name], female    DOB: 1951/07/31, 66 y.o.   MRN: [id number]  Chief Complaint  Patient presents with  . CPE    not fasting    HPI:  [name] is a 66 y.o. female who presents today for a Medicare Annual Wellness/Physical exam.    1) Health Maintenance -   Diet - Averaging about 3 meals per day consisting of a regular diet focusing on a nutritarien intake with lots of fruits and vegetables; Caffeine intake of 2-3 cups per day  Exercise - 10x per month at the [facility] with some resistance training and cardiovascular.   2) Preventative Exams / Immunizations:  Dental -- Up to date  Vision -- Up to date    Health Maintenance  Topic Date Due  . MAMMOGRAM  02/23/2017  . INFLUENZA VACCINE  06/01/2017  . PNA vac Low Risk Adult (2 of 2 - PPSV23) 06/14/2017  . TETANUS/TDAP  11/07/2023  . COLONOSCOPY  06/11/2026  . DEXA SCAN  Completed  . Hepatitis C Screening  Completed     Immunization History  Administered Date(s) Administered  . Influenza Split 10/12/2011, 07/27/2013  . Influenza, High Dose Seasonal PF 06/29/2017  . Influenza,inj,Quad PF,6+ Mos 07/18/2014  . Influenza-Unspecified 07/31/2015  . Pneumococcal Conjugate-13 06/14/2016  . Td 11/02/2003  . Tdap 11/06/2013  . Zoster 03/28/2013    RISK FACTORS  Tobacco History  Smoking Status  . Never Smoker  Smokeless Tobacco  . Never Used     Cardiac risk factors: advanced age (older than 64 for men, 38 for women) and dyslipidemia.  Depression Screen  Depression screen [facility] 2/9 06/29/2017  Decreased Interest 0  Down, Depressed, Hopeless 0  PHQ - 2 Score 0  Altered sleeping -  Tired, decreased energy -  Change in appetite -  Feeling bad or failure about yourself  -  Trouble concentrating -  Moving slowly or fidgety/restless -  Suicidal thoughts -  PHQ-9 Score -  Difficult doing work/chores -     Activities of Daily Living In your present state of health, do you  have any difficulty performing the following activities?:  Driving? No Managing money?  No Feeding yourself? No Getting from bed to chair? No Climbing a flight of stairs? No Preparing food and eating?: No Bathing or showering? No Getting dressed: No Getting to the toilet? No Using the toilet: No Moving around from place to place: No In the past year have you fallen or had a near fall?:No   Home Safety Has smoke detector and wears seat belts. No excess sun exposure. Are there smokers in your home (other than you)?  No Do you feel safe at home?  Yes   Hearing Difficulties: No Do you often ask people to speak up or repeat themselves? No Do you experience ringing or noises in your ears? No  Do you have difficulty understanding soft or whispered voices? No    Cognitive Testing  Alert? Yes   Normal Appearance? Yes  Oriented to person? Yes  Place? Yes   Time? Yes  Recall of three objects?  Yes  Can perform simple calculations? Yes  Displays appropriate judgment? Yes  Can read the correct time from a watch face? Yes  Do you feel that you have a problem with memory? No  Do you often misplace items? No   Advanced Directives have been discussed with the patient? Yes   Current Physicians/Providers and Suppliers  [place] [place] - Internal Medicine 2. Laurin Coder, MD - Gynecology  3. Alonza Bogus, MD - Neurology   Indicate any recent Medical Services you may have received from other than [facility] providers in the past year (date may be approximate).  All answers were reviewed with the patient and necessary referrals were made:  Mauricio Po, [place]   06/29/2017    Allergies  Allergen Reactions  . Codeine Nausea Only    REACTION: nausea  . Hydrocodone-Acetaminophen Nausea Only  . Morphine And Related Nausea Only    Intense nausea     Outpatient Medications Prior to Visit  Medication Sig Dispense Refill  . Cholecalciferol (VITAMIN D3) 1000 UNITS CAPS Take by mouth daily.       . Multiple Vitamins-Minerals (MACULAR VITAMIN BENEFIT) TABS Take 2 tablets by mouth daily.    . polyethylene glycol powder (MIRALAX) powder Take 17 g by mouth 3 (three) times a week.     . primidone (MYSOLINE) 50 MG tablet Take 2 tablets (100 mg total) by mouth 2 (two) times daily. 360 tablet 1  . ALPRAZolam (XANAX) 0.25 MG tablet Take 1 tablet (0.25 mg total) by mouth 2 (two) times daily as needed for anxiety or sleep. 60 tablet 5  . mirtazapine (REMERON) 15 MG tablet take 1 tablet by mouth at bedtime 90 tablet 1  . alendronate (FOSAMAX) 70 MG tablet Take 70 mg by mouth once a week. Take with a full glass of water on an empty stomach.    Marland Kitchen 0.9 %  sodium chloride infusion      No facility-administered medications prior to visit.      Past Medical History:  Diagnosis Date  . Actinic keratosis   . ALLERGIC RHINITIS   . ANEMIA-NOS   . Anxiety   . Cataract 2014   left cataract extraction with lens implant  . DEPRESSION   . FIBROIDS, UTERUS   . HYPERLIPIDEMIA   . Macular degeneration of both eyes 2012   mild  . 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 Neuromuscular disorder ([place]) 2014   hand tremors  . OSTEOPOROSIS 2002   on bisphos  . Sarcoidosis 2009     Past Surgical History:  Procedure Laterality Date  . BREAST SURGERY     biospy  . CATARACT EXTRACTION Left 07/2013   dr Herbert Deaner  . Child birth  24  . COLONOSCOPY  2011  . STOMACH SURGERY  11/2008   2-3cm GIST excision  . TONSILLECTOMY  1957  . TUBAL LIGATION    . UPPER GASTROINTESTINAL ENDOSCOPY       Family History  Problem Relation Age of Onset  . Allergies Mother   . Colon cancer Mother   . Tremor Mother   . Hodgkin's lymphoma Father 67  . Allergies Daughter   . Breast cancer Maternal Aunt   . Prostate cancer Maternal Uncle   . Esophageal cancer Maternal Grandfather   . Stomach cancer Neg Hx       Social History   Social History  . Marital status: Single    Spouse name: N/A  . Number of children: N/A  . Years of education: N/A   Occupational History  . retired Retired    Publishing copy   Social History Main Topics  . Smoking status: Never Smoker  . Smokeless tobacco: Never Used  . Alcohol use 1.8 - 2.4 oz/week  3 - 4 Glasses of wine per week  . Drug use: No  . Sexual activity: Not on file   Other Topics Concern  . Not on file   Social History Narrative   Divorced, lives alone. Pt has 1 dtr. 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 HENT: Head: Denies headache or neck pain Ears: Denies changes in hearing, ringing in ears, earache, drainage Nose: Denies discharge, stuffiness, itching, nosebleed, sinus pain Throat: Denies sore throat, hoarseness, dry mouth, sores, thrush Eyes: Denies loss/changes in vision, pain, redness, blurry/double vision, flashing lights Cardiovascular: Denies chest pain/discomfort, tightness, palpitations, shortness of breath with activity, difficulty lying down, swelling, sudden awakening with shortness of breath Respiratory: Denies shortness of breath, cough, sputum production, wheezing Gastrointestinal: Denies dysphasia, heartburn, change in appetite, nausea, change in bowel habits, rectal bleeding, constipation, diarrhea, yellow skin or eyes Genitourinary: Denies frequency, urgency, burning/pain, blood in urine, incontinence, change in urinary strength. Musculoskeletal: Denies muscle/joint pain, stiffness, back pain, redness or swelling of joints, trauma Skin: Denies rashes, lumps, itching, dryness, color changes, or hair/nail changes Neurological: Denies dizziness, fainting, seizures, weakness, numbness, tingling, tremor Psychiatric - Denies nervousness, stress, depression or  memory loss Endocrine: Denies heat or cold intolerance, sweating, frequent urination, excessive thirst, changes in appetite Hematologic: Denies ease of bruising or bleeding    Objective:     BP 118/72 (BP Location: Left Arm, Patient Position: Sitting, Cuff Size: Normal)   Pulse 80   Temp 97.9 F (36.6 C) (Oral)   Resp 16   Ht 5\' 4"  (1.626 m)   Wt 119 lb (54 kg)   SpO2 97%   BMI 20.43 kg/m  Nursing note and vital signs reviewed.  Physical Exam  Constitutional: She is oriented to person, place, and time. She appears well-developed and well-nourished.  HENT:  Head: Normocephalic.  Right Ear: Hearing, tympanic membrane, external ear and ear canal normal.  Left Ear: Hearing, tympanic membrane, external ear and ear canal normal.  Nose: Nose normal.  Mouth/Throat: Uvula is midline, oropharynx is clear and moist and mucous membranes are normal.  Eyes: Pupils are equal, round, and reactive to light. Conjunctivae and EOM are normal.  Neck: Neck supple. No JVD present. No tracheal deviation present. No thyromegaly present.  Cardiovascular: Normal rate, regular rhythm, normal heart sounds and intact distal pulses.   Pulmonary/Chest: Effort normal and breath sounds normal.  Abdominal: Soft. Bowel sounds are normal. She exhibits no distension and no mass. There is no tenderness. There is no rebound and no guarding.  Musculoskeletal: Normal range of motion. She exhibits no edema or tenderness.  Lymphadenopathy:    She has no cervical adenopathy.  Neurological: She is alert and oriented to person, place, and time. She has normal reflexes. No cranial nerve deficit. She exhibits normal muscle tone. Coordination normal.  Skin: Skin is warm and dry.  Psychiatric: She has a normal mood and affect. Her behavior is normal. Judgment and thought content normal.       Assessment & Plan:   During the course of the visit the patient was educated and counseled about appropriate screening and preventive  services including:    Pneumococcal vaccine   Influenza vaccine  Td vaccine  Colorectal cancer screening  Diabetes screening  Glaucoma screening  Nutrition counseling   Diet review for nutrition referral? Yes ____  Not Indicated  _X___      Patient Instructions (the written plan) was given to the patient.  Medicare Attestation I have personally reviewed: The patient's medical and social history Their use of alcohol, tobacco or illicit drugs Their current medications and supplements The patient's functional ability including ADLs,fall risks, home safety risks, cognitive, and hearing and visual impairment Diet and physical activities Evidence for depression or mood disorders  The patient's weight, height, BMI,  have been recorded in the chart.  I have made referrals, counseling, and provided education to the patient based on review of the above and I have provided the patient with a written personalized care plan for preventive services.     Problem List Items Addressed This Visit      Other   Hyperlipidemia    Not currently maintained on medications and not interested in taking statins at this time. Continue with lifestyle management. Obtain lipid profile. Continue lifestyle management pending cholesterol results.      Caregiver with fatigue   Relevant Medications   mirtazapine (REMERON) 15 MG tablet   Medicare annual wellness visit, initial - Primary    Reviewed and updated patient's medical, surgical, family and social history. Medications and allergies were also reviewed. Basic screenings for depression, activities of daily living, hearing, cognition and safety were performed. Provider list was updated and health plan was provided to the patient.       Relevant Orders   CBC   Comprehensive metabolic panel   Lipid panel   Routine general medical examination at a health care facility    1) Anticipatory Guidance: Discussed importance of wearing a seatbelt while  driving and not texting while driving; changing batteries in smoke detector at least once annually; wearing suntan lotion when outside; eating a balanced and moderate diet; getting physical activity at least 30 minutes per day.  2) Immunizations / Screenings / Labs:  Influenza updated today. Will consider Pneumovax. All other immunizations are up to date per recommendations. Due for breast cancer screening with patient indicating she will contact imaging center to schedule. Colon cancer and bone mineral density screens are up to date per recommendations. All other screenings are up to date per recommendations. Obtain CBC, CMET, and lipid profile.    Overall well exam with risk factors for cardiovascular disease including hyperlipidemia. Previously maintained on Fosamax for osteoporosis which is managed by gynecology. Not currently maintained on medications with previous elevated cholesterol levels. Overall she feels well. Encouraged to complete mammogram independently. Continue other healthy lifestyle behaviors and choices. Follow-up prevention exam in 1 year. Follow-up office visit pending blood work and for chronic conditions as needed.        Other Visit Diagnoses    Need for influenza vaccination       Relevant Orders   Flu vaccine HIGH DOSE PF (Completed)       I have discontinued Ms. Nudo's alendronate. I have also changed her mirtazapine. Additionally, I am having her maintain her Vitamin D3, polyethylene glycol powder, MACULAR VITAMIN BENEFIT, primidone, and ALPRAZolam. We will stop administering sodium chloride.   Meds ordered this encounter  Medications  . mirtazapine (REMERON) 15 MG tablet    Sig: Take 1 tablet (15 mg total) by mouth at bedtime.    Dispense:  90 tablet    Refill:  1  . ALPRAZolam (XANAX) 0.25 MG tablet    Sig: Take 1 tablet (0.25 mg total) by mouth 2 (two) times daily as needed for anxiety or sleep.  Dispense:  60 tablet    Refill:  2     Follow-up:  Return in about 1 year (around 06/29/2018), or if symptoms worsen or fail to improve.   Mauricio Po, FNP

## 2017-06-29 NOTE — Patient Instructions (Signed)
Thank you for choosing [facility].  SUMMARY AND INSTRUCTIONS:  Please continue to take your medication as prescribed.   You are doing well!  Work on obtaining mammogram.   Medication:  Your prescription(s) have been submitted to your pharmacy or been printed and provided for you. Please take as directed and contact our office if you believe you are having problem(s) with the medication(s) or have any questions.  Labs:  Please stop by the lab on the lower level of the building for your blood work. Your results will be released to MyChart (or called to you) after review, usually within 72 hours after test completion. If any changes need to be made, you will be notified at that same time.  1.) The lab is open from 7:30am to 5:30 pm Monday-Friday 2.) No appointment is necessary 3.) Fasting (if needed) is 6-8 hours after food and drink; [place] coffee and water are okay   Follow up:  If your symptoms worsen or fail to improve, please contact our office for further instruction, or in case of emergency go directly to the emergency room at the closest medical facility.   Health Maintenance  Topic Date Due  . MAMMOGRAM  02/23/2017  . INFLUENZA VACCINE  06/01/2017  . PNA vac Low Risk Adult (2 of 2 - PPSV23) 06/14/2017  . TETANUS/TDAP  11/07/2023  . COLONOSCOPY  06/11/2026  . DEXA SCAN  Completed  . Hepatitis C Screening  Completed   Health Maintenance, Female Adopting a healthy lifestyle and getting preventive care can go a long way to promote health and wellness. Talk with your health care provider about what schedule of regular examinations is right for you. This is a good chance for you to check in with your provider about disease prevention and staying healthy. In between checkups, there are plenty of things you can do on your own. Experts have done a lot of research about which lifestyle changes and preventive measures are most likely to keep you healthy. Ask your health care  provider for more information. Weight and diet Eat a healthy diet  Be sure to include plenty of vegetables, fruits, low-fat dairy products, and lean protein.  Do not eat a lot of foods high in solid fats, added sugars, or salt.  Get regular exercise. This is one of the most important things you can do for your health. ? Most adults should exercise for at least 150 minutes each week. The exercise should increase your heart rate and make you sweat (moderate-intensity exercise). ? Most adults should also do strengthening exercises at least twice a week. This is in addition to the moderate-intensity exercise.  Maintain a healthy weight  Body mass index (BMI) is a measurement that can be used to identify possible weight problems. It estimates body fat based on height and weight. Your health care provider can help determine your BMI and help you achieve or maintain a healthy weight.  For females 70 years of age and older: ? A BMI below 18.5 is considered underweight. ? A BMI of 18.5 to 24.9 is normal. ? A BMI of 25 to 29.9 is considered overweight. ? A BMI of 30 and above is considered obese.  Watch levels of cholesterol and blood lipids  You should start having your blood tested for lipids and cholesterol at 66 years of age, then have this test every 5 years.  You may need to have your cholesterol levels checked more often if: ? Your lipid or cholesterol levels  are high. ? You are older than 66 years of age. ? You are at high risk for heart disease.  Cancer screening Lung Cancer  Lung cancer screening is recommended for adults 51-76 years old who are at high risk for lung cancer because of a history of smoking.  A yearly low-dose CT scan of the lungs is recommended for people who: ? Currently smoke. ? Have quit within the past 15 years. ? Have at least a 30-pack-year history of smoking. A pack year is smoking an average of one pack of cigarettes a day for 1 year.  Yearly screening  should continue until it has been 15 years since you quit.  Yearly screening should stop if you develop a health problem that would prevent you from having lung cancer treatment.  Breast Cancer  Practice breast self-awareness. This means understanding how your breasts normally appear and feel.  It also means doing regular breast self-exams. Let your health care provider know about any changes, no matter how small.  If you are in your 20s or 30s, you should have a clinical breast exam (CBE) by a health care provider every 1-3 years as part of a regular health exam.  If you are 73 or older, have a CBE every year. Also consider having a breast X-ray (mammogram) every year.  If you have a family history of breast cancer, talk to your health care provider about genetic screening.  If you are at high risk for breast cancer, talk to your health care provider about having an MRI and a mammogram every year.  Breast cancer gene (BRCA) assessment is recommended for women who have family members with BRCA-related cancers. BRCA-related cancers include: ? Breast. ? Ovarian. ? Tubal. ? Peritoneal cancers.  Results of the assessment will determine the need for genetic counseling and BRCA1 and BRCA2 testing.  Cervical Cancer Your health care provider may recommend that you be screened regularly for cancer of the pelvic organs (ovaries, uterus, and vagina). This screening involves a pelvic examination, including checking for microscopic changes to the surface of your cervix (Pap test). You may be encouraged to have this screening done every 3 years, beginning at age 4.  For women ages 69-65, health care providers may recommend pelvic exams and Pap testing every 3 years, or they may recommend the Pap and pelvic exam, combined with testing for human papilloma virus (HPV), every 5 years. Some types of HPV increase your risk of cervical cancer. Testing for HPV may also be done on women of any age with  unclear Pap test results.  Other health care providers may not recommend any screening for nonpregnant women who are considered low risk for pelvic cancer and who do not have symptoms. Ask your health care provider if a screening pelvic exam is right for you.  If you have had past treatment for cervical cancer or a condition that could lead to cancer, you need Pap tests and screening for cancer for at least 20 years after your treatment. If Pap tests have been discontinued, your risk factors (such as having a new sexual partner) need to be reassessed to determine if screening should resume. Some women have medical problems that increase the chance of getting cervical cancer. In these cases, your health care provider may recommend more frequent screening and Pap tests.  Colorectal Cancer  This type of cancer can be detected and often prevented.  Routine colorectal cancer screening usually begins at 66 years of age and continues through  66 years of age.  Your health care provider may recommend screening at an earlier age if you have risk factors for colon cancer.  Your health care provider may also recommend using home test kits to check for hidden blood in the stool.  A small camera at the end of a tube can be used to examine your colon directly (sigmoidoscopy or colonoscopy). This is done to check for the earliest forms of colorectal cancer.  Routine screening usually begins at age 42.  Direct examination of the colon should be repeated every 5-10 years through 66 years of age. However, you may need to be screened more often if early forms of precancerous polyps or small growths are found.  Skin Cancer  Check your skin from head to toe regularly.  Tell your health care provider about any new moles or changes in moles, especially if there is a change in a mole's shape or color.  Also tell your health care provider if you have a mole that is larger than the size of a pencil  eraser.  Always use sunscreen. Apply sunscreen liberally and repeatedly throughout the day.  Protect yourself by wearing long sleeves, pants, a wide-brimmed hat, and sunglasses whenever you are outside.  Heart disease, diabetes, and high blood pressure  High blood pressure causes heart disease and increases the risk of stroke. High blood pressure is more likely to develop in: ? People who have blood pressure in the high end of the normal range (130-139/85-89 mm Hg). ? People who are overweight or obese. ? People who are African American.  If you are 71-48 years of age, have your blood pressure checked every 3-5 years. If you are 46 years of age or older, have your blood pressure checked every year. You should have your blood pressure measured twice-once when you are at a hospital or clinic, and once when you are not at a hospital or clinic. Record the average of the two measurements. To check your blood pressure when you are not at a hospital or clinic, you can use: ? An automated blood pressure machine at a pharmacy. ? A home blood pressure monitor.  If you are between 58 years and 25 years old, ask your health care provider if you should take aspirin to prevent strokes.  Have regular diabetes screenings. This involves taking a blood sample to check your fasting blood sugar level. ? If you are at a normal weight and have a low risk for diabetes, have this test once every three years after 66 years of age. ? If you are overweight and have a high risk for diabetes, consider being tested at a younger age or more often. Preventing infection Hepatitis B  If you have a higher risk for hepatitis B, you should be screened for this virus. You are considered at high risk for hepatitis B if: ? You were born in a country where hepatitis B is common. Ask your health care provider which countries are considered high risk. ? Your parents were born in a high-risk country, and you have not been immunized  against hepatitis B (hepatitis B vaccine). ? You have HIV or AIDS. ? You use needles to inject street drugs. ? You live with someone who has hepatitis B. ? You have had sex with someone who has hepatitis B. ? You get hemodialysis treatment. ? You take certain medicines for conditions, including cancer, organ transplantation, and autoimmune conditions.  Hepatitis C  Blood testing is recommended for: ?  Everyone born from [birth year] through 1965. ? Anyone with known risk factors for hepatitis C.  Sexually transmitted infections (STIs)  You should be screened for sexually transmitted infections (STIs) including gonorrhea and chlamydia if: ? You are sexually active and are younger than 66 years of age. ? You are older than 66 years of age and your health care provider tells you that you are at risk for this type of infection. ? Your sexual activity has changed since you were last screened and you are at an increased risk for chlamydia or gonorrhea. Ask your health care provider if you are at risk.  If you do not have HIV, but are at risk, it may be recommended that you take a prescription medicine daily to prevent HIV infection. This is called pre-exposure prophylaxis (PrEP). You are considered at risk if: ? You are sexually active and do not regularly use condoms or know the HIV status of your partner(s). ? You take drugs by injection. ? You are sexually active with a partner who has HIV.  Talk with your health care provider about whether you are at high risk of being infected with HIV. If you choose to begin PrEP, you should first be tested for HIV. You should then be tested every 3 months for as long as you are taking PrEP. Pregnancy  If you are premenopausal and you may become pregnant, ask your health care provider about preconception counseling.  If you may become pregnant, take 400 to 800 micrograms (mcg) of folic acid every day.  If you want to prevent pregnancy, talk to your health  care provider about birth control (contraception). Osteoporosis and menopause  Osteoporosis is a disease in which the bones lose minerals and strength with aging. This can result in serious bone fractures. Your risk for osteoporosis can be identified using a bone density scan.  If you are 65 years of age or older, or if you are at risk for osteoporosis and fractures, ask your health care provider if you should be screened.  Ask your health care provider whether you should take a calcium or vitamin D supplement to lower your risk for osteoporosis.  Menopause may have certain physical symptoms and risks.  Hormone replacement therapy may reduce some of these symptoms and risks. Talk to your health care provider about whether hormone replacement therapy is right for you. Follow these instructions at home:  Schedule regular health, dental, and eye exams.  Stay current with your immunizations.  Do not use any tobacco products including cigarettes, chewing tobacco, or electronic cigarettes.  If you are pregnant, do not drink alcohol.  If you are breastfeeding, limit how much and how often you drink alcohol.  Limit alcohol intake to no more than 1 drink per day for nonpregnant women. One drink equals 12 ounces of beer, 5 ounces of wine, or 1 ounces of hard liquor.  Do not use street drugs.  Do not share needles.  Ask your health care provider for help if you need support or information about quitting drugs.  Tell your health care provider if you often feel depressed.  Tell your health care provider if you have ever been abused or do not feel safe at home. This information is not intended to replace advice given to you by your health care provider. Make sure you discuss any questions you have with your health care provider. Document Released: 05/03/2011 Document Revised: 03/25/2016 Document Reviewed: 07/22/2015 Elsevier Interactive Patient Education  [employer].

## 2017-06-29 NOTE — Assessment & Plan Note (Signed)
Reviewed and updated patient's medical, surgical, family and social history. Medications and allergies were also reviewed. Basic screenings for depression, activities of daily living, hearing, cognition and safety were performed. Provider list was updated and health plan was provided to the patient.  

## 2017-06-29 NOTE — Assessment & Plan Note (Signed)
1) Anticipatory Guidance: Discussed importance of wearing a seatbelt while driving and not texting while driving; changing batteries in smoke detector at least once annually; wearing suntan lotion when outside; eating a balanced and moderate diet; getting physical activity at least 30 minutes per day.  2) Immunizations / Screenings / Labs:  Influenza updated today. Will consider Pneumovax. All other immunizations are up to date per recommendations. Due for breast cancer screening with patient indicating she will contact imaging center to schedule. Colon cancer and bone mineral density screens are up to date per recommendations. All other screenings are up to date per recommendations. Obtain CBC, CMET, and lipid profile.    Overall well exam with risk factors for cardiovascular disease including hyperlipidemia. Previously maintained on Fosamax for osteoporosis which is managed by gynecology. Not currently maintained on medications with previous elevated cholesterol levels. Overall she feels well. Encouraged to complete mammogram independently. Continue other healthy lifestyle behaviors and choices. Follow-up prevention exam in 1 year. Follow-up office visit pending blood work and for chronic conditions as needed.

## 2017-06-29 NOTE — Assessment & Plan Note (Signed)
Not currently maintained on medications and not interested in taking statins at this time. Continue with lifestyle management. Obtain lipid profile. Continue lifestyle management pending cholesterol results.

## 2017-06-30 ENCOUNTER — Encounter: Payer: Self-pay | Admitting: Family

## 2017-06-30 ENCOUNTER — Other Ambulatory Visit (INDEPENDENT_AMBULATORY_CARE_PROVIDER_SITE_OTHER): Payer: Medicare Other

## 2017-06-30 DIAGNOSIS — Z0000 Encounter for general adult medical examination without abnormal findings: Secondary | ICD-10-CM | POA: Diagnosis not present

## 2017-06-30 LAB — COMPREHENSIVE METABOLIC PANEL
ALBUMIN: 4.1 g/dL (ref 3.5–5.2)
ALK PHOS: 45 U/L (ref 39–117)
ALT: 12 U/L (ref 0–35)
AST: 15 U/L (ref 0–37)
BUN: 9 mg/dL (ref 6–23)
CO2: 30 mEq/L (ref 19–32)
Calcium: 9.3 mg/dL (ref 8.4–10.5)
Chloride: 103 mEq/L (ref 96–112)
Creatinine, Ser: 0.78 mg/dL (ref 0.40–1.20)
GFR: 78.51 mL/min (ref >60.00)
GLUCOSE: 79 mg/dL (ref 70–99)
POTASSIUM: 4.8 meq/L (ref 3.5–5.1)
Sodium: 139 mEq/L (ref 135–145)
TOTAL PROTEIN: 7 g/dL (ref 6.0–8.3)
Total Bilirubin: 0.4 mg/dL (ref 0.2–1.2)

## 2017-06-30 LAB — CBC
HEMATOCRIT: 47 % — AB (ref 36.0–46.0)
HEMOGLOBIN: 15.7 g/dL — AB (ref 12.0–15.0)
MCHC: 33.4 g/dL (ref 30.0–36.0)
MCV: 94 fl (ref 78.0–100.0)
Platelets: 256 10*3/uL (ref 150.0–400.0)
RBC: 5 Mil/uL (ref 3.87–5.11)
RDW: 13.9 % (ref 11.5–15.5)
WBC: 6.4 10*3/uL (ref 4.0–10.5)

## 2017-06-30 LAB — LIPID PANEL
CHOLESTEROL: 268 mg/dL — AB (ref 0–200)
HDL: 42.6 mg/dL (ref >39.00)
LDL Cholesterol: 192 mg/dL — ABNORMAL HIGH (ref 0–99)
NonHDL: 225.63
Total CHOL/HDL Ratio: 6
Triglycerides: 166 mg/dL — ABNORMAL HIGH (ref 0.0–149.0)
VLDL: 33.2 mg/dL (ref 0.0–40.0)

## 2017-08-29 NOTE — Progress Notes (Signed)
Subjective:   [name] was seen in f/u in the movement disorder clinic at the request of Golden Circle, FNP.  The f/u is for essential tremor.  The patient is a 66 y.o. right handed female with a history of tremor.Onset of symptoms was in high school. Tremor primarily involves the bilateral hand.  The tremor is symmetric.   She notices the right hand more than the L.   Symptoms are currently of moderate severity. Tremor exacerbated by stress.  She is not sure if caffeine will affect tremor (she drinks 2 cups of tea per day).  She rarely drinks soda and no coffee in the home.   She has not noted improvement with alcohol.  She has trouble with soup.  She will try to use 2 hands to eat or drink her soup.  She has trouble pouring liquids.    She never notices the tremor at rest.  She has no voice tremor.  She has good balance.  Her mother has tremor as well.  She may have noted a head tremor a few times.  The patient was recently started on 08/09/2012 on Propranolol LA 60 mg daily.  Last visit, we tried to increase the dose to twice a day dosing but she had significant fatigue with that.  She is not taking it twice a day every other day and on the opposite day she is just taking it once a day.  She thinks that it has helped some but not significantly.  She states that she quit working out and has really had poor motivation to get out of the house.  She is not sure that it is from the medication.  She states she has not been eating right lately either.  She has gained a few pounds.  06/17/16 update:  The patient is seen back in the neurology clinic today, but I have not seen her in nearly 4 years.  She reports worsening of tremor over that time.  Tremor is located in both hands equally and is most evident when she uses the hand.  She notices it especially when writing a check in public.  If using plastic utensils or having to eat soup, it is not good.  Her mother has tremor but is 63 y/o and pts tremor  is worse than mothers.  She is R hand dominant.  She has tried propranolol, but is unable to tolerate higher dosages, and lower dosages have not been efficacious.  When I saw her nearly 4 years ago, she had just tried primidone but had nausea with it and "just felt bad" and couldn't continue it more than a few days before she d/c it.  Reports today that she thinks that she would like to try the propranolol again but she didn't remember the SE that she had in the past until I read her old notes to her.  09/21/16 update:  The patient follows up today.  We started her on primidone last visit.  We started it very slowly and she is only on 50 mg at night.  No SE this time.  Tremor may be a little better but not enough better.    01/19/17 update:  Patient follows up today.  She is now on primidone 50 mg twice per day.  She states that it is helping some but not enough and she would like to increase further.  She is having no SE.    04/27/17 update:  Patient seen today in follow-up.  I increased her primidone last visit so that she was taking 50 mg in the morning and 100 mg at night.  She states that "it is probably a little better."  She denies any side effects with the medication.  No new medical problems or medications since last visit.  08/30/17 update: Patient is seen today in follow-up for essential tremor.  She is now on primidone, 100 mg twice per day.  She states that she has no side effects with the medication.  She states that she has good and bad days.  She recently threw her drink (peach bellini) at her sister when trying to toast her.  Current/Previously tried tremor medications: propranolol LA and primidone (SE at low dosages for both)  Current medications that may exacerbate tremor:  none   Allergies  Allergen Reactions  . Codeine Nausea Only    REACTION: nausea  . Hydrocodone-Acetaminophen Nausea Only  . Morphine And Related Nausea Only    Intense nausea    Current Outpatient  Prescriptions on File Prior to Visit  Medication Sig Dispense Refill  . ALPRAZolam (XANAX) 0.25 MG tablet Take 1 tablet (0.25 mg total) by mouth 2 (two) times daily as needed for anxiety or sleep. 60 tablet 2  . Cholecalciferol (VITAMIN D3) 1000 UNITS CAPS Take by mouth daily.      . mirtazapine (REMERON) 15 MG tablet Take 1 tablet (15 mg total) by mouth at bedtime. 90 tablet 1  . polyethylene glycol powder (MIRALAX) powder Take 17 g by mouth 3 (three) times a week.     . primidone (MYSOLINE) 50 MG tablet Take 2 tablets (100 mg total) by mouth 2 (two) times daily. 360 tablet 1   No current facility-administered medications on file prior to visit.     Past Medical History:  Diagnosis Date  . Actinic keratosis   . ALLERGIC RHINITIS   . ANEMIA-NOS   . Anxiety   . Cataract 2014   left cataract extraction with lens implant  . DEPRESSION   . FIBROIDS, UTERUS   . HYPERLIPIDEMIA   . Macular degeneration of both eyes 2012   mild  . 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 Neuromuscular disorder ([place]) 2014   hand tremors  . OSTEOPOROSIS 2002   on bisphos  . Sarcoidosis 2009    Past Surgical History:  Procedure Laterality Date  . BREAST SURGERY     biospy  . CATARACT EXTRACTION Left 07/2013   dr Herbert Deaner  . Child birth  34  . COLONOSCOPY  2011  . STOMACH SURGERY  11/2008   2-3cm GIST excision  . TONSILLECTOMY  1957  . TUBAL LIGATION    . UPPER GASTROINTESTINAL ENDOSCOPY      Social History   Social History  . Marital status: Single    Spouse name: N/A  . Number of children: N/A  . Years of education: N/A   Occupational History  . retired Retired    Publishing copy   Social History Main Topics  . Smoking status: Never Smoker  . Smokeless tobacco: Never Used  . Alcohol use 1.8 - 2.4 oz/week    3 - 4 Glasses of wine per week  . Drug use: No  . Sexual  activity: Not on file   Other Topics Concern  . Not on file   Social History Narrative   Divorced, lives alone. Pt has 1 dtr.  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 Mother Alive       colorectal CA  . Father Deceased       hodgkins disease  . Sister Alive       healthy  . Daughter (Not Specified)  . Mat Aunt (Not Specified)  . Mat Uncle (Not Specified)  . MGF (Not Specified)  . Neg Hx (Not Specified)    Review of Systems   A complete 10 system ROS was obtained and was negative apart from what is mentioned.   Objective:   VITALS:   Vitals:   08/30/17 1102  BP: 108/62  Pulse: 74  SpO2: 98%  Weight: 121 lb (54.9 kg)  Height: 5' 4.5" (1.638 m)   Gen:  Appears stated age and in NAD. HEENT:  Normocephalic, atraumatic. The mucous membranes are moist. The superficial temporal arteries are without ropiness or tenderness. Cardiovascular: Regular rate and rhythm. Lungs: Clear to auscultation bilaterally. Neck: There are no carotid bruits noted bilaterally.  NEUROLOGICAL:  Orientation:  The patient is alert and oriented x 3.   Cranial nerves: There is good facial symmetry.  Speech is fluent and clear. Soft palate rises symmetrically and there is no tongue deviation. Hearing is intact to conversational tone. Tone: Tone is good throughout. Sensation: Sensation is intact to light touch throughout. Coordination:  The patient has no dysdiadichokinesia or dysmetria. Motor: Strength is 5/5 in the bilateral upper and lower extremities.  Shoulder shrug is equal bilaterally.  There is no pronator drift.  There are no fasciculations noted. Gait and Station: The patient is able to ambulate without difficulty.   MOVEMENT EXAM: Tremor:  There is mild to mod tremor of the outstretched hands.   The L is better when given a weight but the right is worse.  She is R handed.   It is the same in the  proximal and distal position.  Tremor is evident with Archimedes spirals.  She has a mild resting tremor on the right.  Spills water when asked to pour from one glass to another    LABS:  Lab Results  Component Value Date   TSH 2.67 10/28/2015     Chemistry      Component Value Date/Time   NA 139 06/30/2017 0832   K 4.8 06/30/2017 0832   CL 103 06/30/2017 0832   CO2 30 06/30/2017 0832   BUN 9 06/30/2017 0832   CREATININE 0.78 06/30/2017 0832      Component Value Date/Time   CALCIUM 9.3 06/30/2017 0832   ALKPHOS 45 06/30/2017 0832   AST 15 06/30/2017 0832   ALT 12 06/30/2017 0832   BILITOT 0.4 06/30/2017 0832       Assessment/Plan   1.  Essential tremor.   This is evidenced by longstanding history and most evident with activation and some with intention.  There is also a fam hx of tremor in her mother.   She has a mild resting component on the R but this can be seen in longstanding ET.  She has no bradykinesia.  She has been unable to tolerate effective dosages of Inderal and her BP is already low.  She and I discussed surgery today in detail.  She is not interested in surgery.    -we will stay on primidone, 100 mg bid  -we will add gabapentin, 300 mg q hs x 1 week  and then increase to 300 mg bid.  Risks, benefits, side effects and alternative therapies were discussed.  The opportunity to ask questions was given and they were answered to the best of my ability.  The patient expressed understanding and willingness to follow the outlined treatment protocols.  2.  Follow up is anticipated in the next few months, sooner should new neurologic issues arise.  Much greater than 50% of this visit was spent in counseling and coordinating care.  Total face to face time:  30 min

## 2017-08-30 ENCOUNTER — Encounter: Payer: Self-pay | Admitting: Neurology

## 2017-08-30 ENCOUNTER — Ambulatory Visit (INDEPENDENT_AMBULATORY_CARE_PROVIDER_SITE_OTHER): Payer: Medicare Other | Admitting: Neurology

## 2017-08-30 VITALS — BP 108/62 | HR 74 | Ht 64.5 in | Wt 121.0 lb

## 2017-08-30 DIAGNOSIS — G250 Essential tremor: Secondary | ICD-10-CM | POA: Diagnosis not present

## 2017-08-30 MED ORDER — GABAPENTIN 300 MG PO CAPS: 300.0000 mg | ORAL_CAPSULE | Freq: Two times a day (BID) | ORAL | 1 refills | Status: DC

## 2017-08-30 NOTE — Patient Instructions (Signed)
Start gabapentin 300 mg at night for a week and then increase to 300 mg twice per day thereafter.  Let me know if you have any side effects or problems

## 2017-11-15 ENCOUNTER — Other Ambulatory Visit: Payer: Self-pay | Admitting: Neurology

## 2017-11-15 MED ORDER — PRIMIDONE 50 MG PO TABS: 100.0000 mg | ORAL_TABLET | Freq: Two times a day (BID) | ORAL | 1 refills | Status: DC

## 2018-01-06 NOTE — Progress Notes (Signed)
Subjective:   [name] was seen in f/u in the movement disorder clinic at the request of Golden Circle, FNP.  The f/u is for essential tremor.  The patient is a 67 y.o. right handed female with a history of tremor.Onset of symptoms was in high school. Tremor primarily involves the bilateral hand.  The tremor is symmetric.   She notices the right hand more than the L.   Symptoms are currently of moderate severity. Tremor exacerbated by stress.  She is not sure if caffeine will affect tremor (she drinks 2 cups of tea per day).  She rarely drinks soda and no coffee in the home.   She has not noted improvement with alcohol.  She has trouble with soup.  She will try to use 2 hands to eat or drink her soup.  She has trouble pouring liquids.    She never notices the tremor at rest.  She has no voice tremor.  She has good balance.  Her mother has tremor as well.  She may have noted a head tremor a few times.  The patient was recently started on 08/09/2012 on Propranolol LA 60 mg daily.  Last visit, we tried to increase the dose to twice a day dosing but she had significant fatigue with that.  She is not taking it twice a day every other day and on the opposite day she is just taking it once a day.  She thinks that it has helped some but not significantly.  She states that she quit working out and has really had poor motivation to get out of the house.  She is not sure that it is from the medication.  She states she has not been eating right lately either.  She has gained a few pounds.  06/17/16 update:  The patient is seen back in the neurology clinic today, but I have not seen her in nearly 4 years.  She reports worsening of tremor over that time.  Tremor is located in both hands equally and is most evident when she uses the hand.  She notices it especially when writing a check in public.  If using plastic utensils or having to eat soup, it is not good.  Her mother has tremor but is 1 y/o and pts tremor  is worse than mothers.  She is R hand dominant.  She has tried propranolol, but is unable to tolerate higher dosages, and lower dosages have not been efficacious.  When I saw her nearly 4 years ago, she had just tried primidone but had nausea with it and "just felt bad" and couldn't continue it more than a few days before she d/c it.  Reports today that she thinks that she would like to try the propranolol again but she didn't remember the SE that she had in the past until I read her old notes to her.  09/21/16 update:  The patient follows up today.  We started her on primidone last visit.  We started it very slowly and she is only on 50 mg at night.  No SE this time.  Tremor may be a little better but not enough better.    01/19/17 update:  Patient follows up today.  She is now on primidone 50 mg twice per day.  She states that it is helping some but not enough and she would like to increase further.  She is having no SE.    04/27/17 update:  Patient seen today in follow-up.  I increased her primidone last visit so that she was taking 50 mg in the morning and 100 mg at night.  She states that "it is probably a little better."  She denies any side effects with the medication.  No new medical problems or medications since last visit.  08/30/17 update: Patient is seen today in follow-up for essential tremor.  She is now on primidone, 100 mg twice per day.  She states that she has no side effects with the medication.  She states that she has good and bad days.  She recently threw her drink (peach bellini) at her sister when trying to toast her.  01/10/18 update: Patient is seen today in follow-up for essential tremor.  She is on primidone, 100 mg twice per day.  We added gabapentin last visit, 300 mg twice per day.  She reports that she didn't think that gabapentin helps.  Handwriting and eating is still troublesome.  Current/Previously tried tremor medications: propranolol LA and primidone (SE at low dosages  for both); gabapentin  Current medications that may exacerbate tremor:  none   Allergies  Allergen Reactions  . Codeine Nausea Only    REACTION: nausea  . Hydrocodone-Acetaminophen Nausea Only  . Morphine And Related Nausea Only    Intense nausea    Current Outpatient Medications on File Prior to Visit  Medication Sig Dispense Refill  . ALPRAZolam (XANAX) 0.25 MG tablet Take 1 tablet (0.25 mg total) by mouth 2 (two) times daily as needed for anxiety or sleep. 60 tablet 2  . Cholecalciferol (VITAMIN D3) 1000 UNITS CAPS Take by mouth daily.      Marland Kitchen gabapentin (NEURONTIN) 300 MG capsule Take 1 capsule (300 mg total) by mouth 2 (two) times daily. 180 capsule 1  . mirtazapine (REMERON) 15 MG tablet Take 1 tablet (15 mg total) by mouth at bedtime. 90 tablet 1  . polyethylene glycol powder (MIRALAX) powder Take 17 g by mouth 3 (three) times a week.     . primidone (MYSOLINE) 50 MG tablet Take 2 tablets (100 mg total) by mouth 2 (two) times daily. 360 tablet 1  . Risedronate Sodium 35 MG TBEC   1   No current facility-administered medications on file prior to visit.     Past Medical History:  Diagnosis Date  . Actinic keratosis   . ALLERGIC RHINITIS   . ANEMIA-NOS   . Anxiety   . Cataract 2014   left cataract extraction with lens implant  . DEPRESSION   . FIBROIDS, UTERUS   . HYPERLIPIDEMIA   . Macular degeneration of both eyes 2012   mild  . 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 Neuromuscular disorder ([place]) 2014   hand tremors  . OSTEOPOROSIS 2002   on bisphos  . Sarcoidosis 2009    Past Surgical History:  Procedure Laterality Date  . BREAST SURGERY     biospy  . CATARACT EXTRACTION Left 07/2013   dr Herbert Deaner  . Child birth  39  . COLONOSCOPY  2011  . STOMACH SURGERY  11/2008   2-3cm GIST excision  . TONSILLECTOMY  1957  . TUBAL LIGATION    . UPPER  GASTROINTESTINAL ENDOSCOPY      Social History   Socioeconomic History  . Marital status: Single    Spouse name: Not on file  . Number of children: Not on file  . Years of education:  Not on file  . Highest education level: Not on file  Social Needs  . Financial resource strain: Not on file  . Food insecurity - worry: Not on file  . Food insecurity - inability: Not on file  . Transportation needs - medical: Not on file  . Transportation needs - non-medical: Not on file  Occupational History  . 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 Smoking status: Never Smoker  . Smokeless tobacco: Never Used  Substance and Sexual Activity  . Alcohol use: Yes    Alcohol/week: 1.8 - 2.4 oz    Types: 3 - 4 Glasses of wine per week  . Drug use: No  . Sexual activity: Not on file  Other Topics Concern  . Not on file  Social History Narrative   Divorced, lives alone. Pt has 1 dtr. 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 Mother  Alive       colorectal CA  . Father  Deceased       hodgkins disease  . Sister  Alive       healthy  . Daughter  (Not Specified)  . Mat Aunt  (Not Specified)  . Mat Uncle  (Not Specified)  . MGF  (Not Specified)  . Neg Hx  (Not Specified)    Review of Systems   A complete 10 system ROS was obtained and was negative apart from what is mentioned.   Objective:   VITALS:   Vitals:   01/10/18 1103  BP: 108/62  Pulse: 78  SpO2: 97%  Weight: 127 lb (57.6 kg)  Height: 5' 4.5" (1.638 m)   Gen:  Appears stated age and in NAD. HEENT:  Normocephalic, atraumatic. The mucous membranes are moist. The superficial temporal arteries are without ropiness or tenderness. Cardiovascular: Regular rate and rhythm. Lungs: Clear to auscultation bilaterally. Neck: There are no carotid bruits noted  bilaterally.  NEUROLOGICAL:  Orientation:  The patient is alert and oriented x 3.   Cranial nerves: There is good facial symmetry.  Speech is fluent and clear. Soft palate rises symmetrically and there is no tongue deviation. Hearing is intact to conversational tone. Tone: Tone is good throughout. Sensation: Sensation is intact to light touch throughout. Coordination:  The patient has no dysdiadichokinesia or dysmetria. Motor: Strength is 5/5 in the bilateral upper and lower extremities.  Shoulder shrug is equal bilaterally.  There is no pronator drift.  There are no fasciculations noted. Gait and Station: The patient is able to ambulate without difficulty.   MOVEMENT EXAM: Tremor:  There is mild to mod tremor of the outstretched hands.   she has some difficulty with archimedes spirals. Mild rest tremor on the right    LABS:  Lab Results  Component Value Date   TSH 2.67 10/28/2015     Chemistry      Component Value Date/Time   NA 139 06/30/2017 0832   K 4.8 06/30/2017 0832   CL 103 06/30/2017 0832   CO2 30 06/30/2017 0832   BUN 9 06/30/2017 0832   CREATININE 0.78 06/30/2017 0832      Component Value Date/Time   CALCIUM 9.3 06/30/2017 0832   ALKPHOS 45 06/30/2017 0832   AST 15 06/30/2017 0832   ALT 12 06/30/2017 0832   BILITOT 0.4 06/30/2017 7989  Assessment/Plan   1.  Essential tremor.   This is evidenced by longstanding history and most evident with activation and some with intention.  There is also a fam hx of tremor in her mother.   She has a mild resting component on the R but this can be seen in longstanding ET.  She has no bradykinesia.  She has been unable to tolerate effective dosages of Inderal and her BP is already low.  She and I discussed surgery today in detail.  She is not interested in surgery.    - Increase primidone to 50 mg, 3 in the AM and 2 at night for 2 weeks and then, if tolerated, increase to 50 mg, 3 tablets twice per day  -discontinue  Gabapentin.  Told her how to dispose of properly.    2.  Follow up is anticipated in the next 4 months, sooner should new neurologic issues arise.

## 2018-01-10 ENCOUNTER — Ambulatory Visit: Payer: Medicare Other | Admitting: Neurology

## 2018-01-10 ENCOUNTER — Encounter: Payer: Self-pay | Admitting: Neurology

## 2018-01-10 VITALS — BP 108/62 | HR 78 | Ht 64.5 in | Wt 127.0 lb

## 2018-01-10 DIAGNOSIS — G250 Essential tremor: Secondary | ICD-10-CM

## 2018-01-10 NOTE — Patient Instructions (Addendum)
1.  Stop gabapentin 2.  Increase primidone to 50 mg, 3 in the AM and 2 at night for 2 weeks and then, if tolerated, increase to 50 mg, 3 tablets twice per day  Drop off locations:  [facility] Department [address] [place], [place] Mon. - Fri. 8 a.m. to 5 p.m.  [facility] Department [address]  [place], [place] Mon. - Fri. 8 a.m. to 5 p.m.  [place] [address] Office  [address] [place], [place] Mon. - Fri. 8 a.m. to 5 p.m.

## 2018-01-13 ENCOUNTER — Ambulatory Visit: Payer: Medicare Other | Admitting: Nurse Practitioner

## 2018-01-13 ENCOUNTER — Encounter: Payer: Self-pay | Admitting: Nurse Practitioner

## 2018-01-13 VITALS — BP 122/78 | HR 67 | Temp 97.8°F | Resp 16 | Ht 64.5 in | Wt 126.0 lb

## 2018-01-13 DIAGNOSIS — F411 Generalized anxiety disorder: Secondary | ICD-10-CM | POA: Diagnosis not present

## 2018-01-13 DIAGNOSIS — R5383 Other fatigue: Secondary | ICD-10-CM | POA: Diagnosis not present

## 2018-01-13 DIAGNOSIS — E782 Mixed hyperlipidemia: Secondary | ICD-10-CM

## 2018-01-13 MED ORDER — MIRTAZAPINE 15 MG PO TABS: 15.0000 mg | ORAL_TABLET | Freq: Every day | ORAL | 1 refills | Status: DC

## 2018-01-13 NOTE — Assessment & Plan Note (Signed)
-   mirtazapine (REMERON) 15 MG tablet; Take 1 tablet (15 mg total) by mouth at bedtime.  Dispense: 90 tablet; Refill: 1

## 2018-01-13 NOTE — Patient Instructions (Addendum)
Return after 8/29 for your annual physical, or sooner if you need.  It was good to meet you. Thanks for letting me take care of you today :)   Mediterranean Diet A Mediterranean diet refers to food and lifestyle choices that are based on the traditions of countries located on the [employer]. This way of eating has been shown to help prevent certain conditions and improve outcomes for people who have chronic diseases, like kidney disease and heart disease. What are tips for following this plan? Lifestyle  Cook and eat meals together with your family, when possible.  Drink enough fluid to keep your urine clear or pale yellow.  Be physically active every day. This includes: ? Aerobic exercise like running or swimming. ? Leisure activities like gardening, walking, or housework.  Get 7-8 hours of sleep each night.  If recommended by your health care provider, drink red wine in moderation. This means 1 glass a day for nonpregnant women and 2 glasses a day for men. A glass of wine equals 5 oz (150 mL). Reading food labels  Check the serving size of packaged foods. For foods such as rice and pasta, the serving size refers to the amount of cooked product, not dry.  Check the total fat in packaged foods. Avoid foods that have saturated fat or trans fats.  Check the ingredients list for added sugars, such as corn syrup. Shopping  At the grocery store, buy most of your food from the areas near the walls of the store. This includes: ? Fresh fruits and vegetables (produce). ? Grains, beans, nuts, and seeds. Some of these may be available in unpackaged forms or large amounts (in bulk). ? Fresh seafood. ? Poultry and eggs. ? Low-fat dairy products.  Buy whole ingredients instead of prepackaged foods.  Buy fresh fruits and vegetables in-season from local farmers markets.  Buy frozen fruits and vegetables in resealable bags.  If you do not have access to quality fresh seafood, buy  precooked frozen shrimp or canned fish, such as tuna, salmon, or sardines.  Buy small amounts of raw or cooked vegetables, salads, or olives from the deli or salad bar at your store.  Stock your pantry so you always have certain foods on hand, such as olive oil, canned tuna, canned tomatoes, rice, pasta, and beans. Cooking  Cook foods with extra-virgin olive oil instead of using butter or other vegetable oils.  Have meat as a side dish, and have vegetables or grains as your main dish. This means having meat in small portions or adding small amounts of meat to foods like pasta or stew.  Use beans or vegetables instead of meat in common dishes like chili or lasagna.  Experiment with different cooking methods. Try roasting or broiling vegetables instead of steaming or sauteing them.  Add frozen vegetables to soups, stews, pasta, or rice.  Add nuts or seeds for added healthy fat at each meal. You can add these to yogurt, salads, or vegetable dishes.  Marinate fish or vegetables using olive oil, lemon juice, garlic, and fresh herbs. Meal planning  Plan to eat 1 vegetarian meal one day each week. Try to work up to 2 vegetarian meals, if possible.  Eat seafood 2 or more times a week.  Have healthy snacks readily available, such as: ? Vegetable sticks with hummus. ? [place] yogurt. ? Fruit and nut trail mix.  Eat balanced meals throughout the week. This includes: ? Fruit: 2-3 servings a day ? Vegetables: 4-5 servings  a day ? Low-fat dairy: 2 servings a day ? Fish, poultry, or lean meat: 1 serving a day ? Beans and legumes: 2 or more servings a week ? Nuts and seeds: 1-2 servings a day ? Whole grains: 6-8 servings a day ? Extra-virgin olive oil: 3-4 servings a day  Limit red meat and sweets to only a few servings a month What are my food choices?  Mediterranean diet ? Recommended ? Grains: Whole-grain pasta. Brown rice. Bulgar wheat. Polenta. Couscous. Whole-wheat bread. [place]. ? Vegetables: Artichokes. Beets. Broccoli. Cabbage. Carrots. Eggplant. Green beans. Chard. Kale. Spinach. Onions. Leeks. Peas. Squash. Tomatoes. Peppers. Radishes. ? Fruits: Apples. Apricots. Avocado. Berries. Bananas. Cherries. Dates. Figs. Grapes. Lemons. Melon. Oranges. Peaches. Plums. Pomegranate. ? Meats and other protein foods: Beans. Almonds. Sunflower seeds. Pine nuts. Peanuts. [place]. Salmon. Scallops. Shrimp. Ogle. Tilapia. Clams. Oysters. Eggs. ? Dairy: Low-fat milk. Cheese. Greek yogurt. ? Beverages: Water. Red wine. Herbal tea. ? Fats and oils: Extra virgin olive oil. Avocado oil. Grape seed oil. ? Sweets and desserts: [place] yogurt with honey. Baked apples. Poached pears. Trail mix. ? Seasoning and other foods: Basil. Cilantro. Coriander. Cumin. Mint. Parsley. Sage. Rosemary. Tarragon. Garlic. Oregano. Thyme. Pepper. Balsalmic vinegar. Tahini. Hummus. Tomato sauce. Olives. Mushrooms. ? Limit these ? Grains: Prepackaged pasta or rice dishes. Prepackaged cereal with added sugar. ? Vegetables: Deep fried potatoes (french fries). ? Fruits: Fruit canned in syrup. ? Meats and other protein foods: Beef. Pork. Lamb. Poultry with skin. Hot dogs. [place] Salines. ? Dairy: Ice cream. Sour cream. Whole milk. ? Beverages: Juice. Sugar-sweetened soft drinks. Beer. Liquor and spirits. ? Fats and oils: Butter. Canola oil. Vegetable oil. Beef fat (tallow). Lard. ? Sweets and desserts: Cookies. Cakes. Pies. Candy. ? Seasoning and other foods: Mayonnaise. Premade sauces and marinades. ? The items listed may not be a complete list. Talk with your dietitian about what dietary choices are right for you. Summary  The Mediterranean diet includes both food and lifestyle choices.  Eat a variety of fresh fruits and vegetables, beans, nuts, seeds, and whole grains.  Limit the amount of red meat and sweets that you eat.  Talk with your health care provider about whether it is safe for you to drink red wine in  moderation. This means 1 glass a day for nonpregnant women and 2 glasses a day for men. A glass of wine equals 5 oz (150 mL). This information is not intended to replace advice given to you by your health care provider. Make sure you discuss any questions you have with your health care provider. Document Released: 06/10/2016 Document Revised: 07/13/2016 Document Reviewed: 06/10/2016 Elsevier Interactive Patient Education  [employer].

## 2018-01-13 NOTE — Assessment & Plan Note (Signed)
Stable on current dosage of mirtazapine, xanax prn, will continue these medications at current dosages We discussed the use of xanax only prn for acute anxiety CBC, CMET, lipid panel on 06/30/17 at annual physical - mirtazapine (REMERON) 15 MG tablet; Take 1 tablet (15 mg total) by mouth at bedtime.  Dispense: 90 tablet; Refill: 1 F/U in 6 months for CPE or sooner for new, worsening anxiety, depression

## 2018-01-13 NOTE — Progress Notes (Addendum)
Name: [name]   MRN: [id number]    DOB: [birth year]   Date:01/13/2018       Progress Note  Subjective  Chief Complaint  Chief Complaint  Patient presents with  . Establish Care    medication refill on remeron    HPI [name] is establishing care with me today, transferring from a prior provider In our clinic that left. She is requesting a refill of her remeron. We will also follow up on her chronic medical conditions. She is currently following with neurology for  Treatment of essential tremor.  Anxiety and depression She is maintained on remeron 15mg  daily for anxiety and depression, xanax prn. She typically only takes the xanax about once a month- she does not find it really helps her too much. She was started on this medication prior her prior PCP in 2017 for sleeplessness and caregiver fatigue. 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 She denies drowsiness, dizziness, fevers, restlessness, mood swings, sore throat,  hallucinations, paranoia, depression, thoughts of hurting herself or others, rash  Cholesterol-not currently maintained on any medications She was recommended to start on statin by prior provider on several occassions but declined. She says today she does not want to take a statin due to adverse effects and  Reports she has been trying to follow a healthy diet low in meat and high in fruits and vegetables and exercise regularly.  Lab Results  Component Value Date   CHOL 268 (H) 06/30/2017   HDL 42.60 06/30/2017   LDLCALC 192 (H) 06/30/2017   LDLDIRECT 193.5 08/02/2012   TRIG 166.0 (H) 06/30/2017   CHOLHDL 6 06/30/2017     Patient Active Problem List   Diagnosis Date Noted  . Medicare annual wellness visit, initial 06/14/2016  . Routine general medical examination at a health  care facility 06/14/2016  . Caregiver with fatigue 03/30/2016  . 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 Anxiety state 04/09/2015  . Essential tremor   . Osteoporosis 09/22/2009  . FIBROIDS, UTERUS 09/10/2009  . ANEMIA-NOS 09/10/2009  . DEPRESSION 09/10/2009  . ALLERGIC RHINITIS 09/10/2009  . Hyperlipidemia 10/16/2008    Past Surgical History:  Procedure Laterality Date  . BREAST SURGERY     [facility]  . CATARACT EXTRACTION Left 07/2013   dr Herbert Deaner  . Child birth  82  . COLONOSCOPY  2011  . STOMACH SURGERY  11/2008   2-3cm GIST excision  . TONSILLECTOMY  1957  . TUBAL LIGATION    . UPPER GASTROINTESTINAL ENDOSCOPY      Family History  Problem Relation Age of Onset  . Allergies Mother   . Colon cancer Mother   . Tremor Mother   . Hodgkin's lymphoma Father 33  . Allergies Daughter   . Breast cancer Maternal Aunt   . Prostate cancer Maternal Uncle   . Esophageal cancer Maternal Grandfather   . Stomach cancer Neg Hx     Social History   Socioeconomic History  . Marital status: Single    Spouse name: Not on file  . Number of children: Not on file  . Years of education: Not on file  . Highest education level: Not on file  Social Needs  . Financial resource strain: Not on file  . Food insecurity - worry: Not  on file  . Food insecurity - inability: Not on file  . Transportation needs - medical: Not on file  . Transportation needs - non-medical: Not on file  Occupational History  . 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 Smoking status: Never Smoker  . Smokeless tobacco: Never Used  Substance and Sexual Activity  . Alcohol use: Yes    Alcohol/week: 1.8 - 2.4 oz    Types: 3 - 4 Glasses of wine per week  . Drug use: No  . Sexual activity: Not on file  Other Topics Concern  . Not on file  Social History Narrative   Divorced, lives alone. Pt has 1 dtr. 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  ALPRAZolam (XANAX) 0.25 MG tablet, Take 1 tablet (0.25 mg total) by mouth 2 (two) times daily as needed for anxiety or sleep., Disp: 60 tablet, Rfl: 2 .  Cholecalciferol (VITAMIN D3) 1000 UNITS CAPS, Take by mouth daily.  , Disp: , Rfl:  .  mirtazapine (REMERON) 15 MG tablet, Take 1 tablet (15 mg total) by mouth at bedtime., Disp: 90 tablet, Rfl: 1 .  polyethylene glycol powder (MIRALAX) powder, Take 17 g by mouth 3 (three) times a week. , Disp: , Rfl:  .  primidone (MYSOLINE) 50 MG tablet, Take 2 tablets (100 mg total) by mouth 2 (two) times daily., Disp: 360 tablet, Rfl: 1 .  Risedronate Sodium 35 MG TBEC, , Disp: , Rfl: 1  Allergies  Allergen Reactions  . Codeine Nausea Only    REACTION: nausea  . Hydrocodone-Acetaminophen Nausea Only  . Morphine And Related Nausea Only    Intense nausea     ROS See HPI  Objective  Vitals:   01/13/18 1119  BP: 122/78  Pulse: 67  Resp: 16  Temp: 97.8 F (36.6 C)  TempSrc: Oral  SpO2: 98%  Weight: 126 lb (57.2 kg)  Height: 5' 4.5" (1.638 m)    Body mass index is 21.29 kg/m.  Physical Exam Vital signs reviewed Constitutional: Patient appears well-developed and well-nourished. No distress.  HENT: Head: Normocephalic and atraumatic.  Nose: Nose normal. Mouth/Throat: Oropharynx is clear and moist. Eyes: Conjunctivae and EOM are normal. Pupils are equal, round, and reactive to light. No scleral icterus.  Neck: Normal range of motion. Neck supple. Cardiovascular: Normal rate, regular rhythm and normal heart sounds.  No murmur heard. No BLE edema. Distal pulses intact. Pulmonary/Chest: Effort normal and breath sounds normal. No respiratory distress. Musculoskeletal: Normal range of motion. No gross deformities Neurological: She is alert and oriented to person, place, and time. No cranial nerve deficit. Coordination, balance, strength, speech are normal.  ambulates without difficulty. Tremor noted.  Skin: Skin is warm and dry. No rash noted. No erythema.  Psychiatric: Patient has a normal mood and affect. Judgment and thought content normal.   PHQ2/9: Depression screen [facility] 2/9 06/29/2017 06/14/2016 03/30/2016 10/03/2013  Decreased Interest 0 0 1 0  Down, Depressed, Hopeless 0 0 1 0  PHQ - 2 Score 0 0 2 0  Altered sleeping - - 1 -  Tired, decreased energy - - 1 -  Change in appetite - - 1 -  Feeling bad or failure about yourself  - - 2 -  Trouble concentrating - - 1 -  Moving slowly or fidgety/restless - - 0 -  Suicidal thoughts - - 1 -  PHQ-9 Score - - 9 -  Difficult doing work/chores - - Somewhat difficult -    Assessment & Plan RTC in 6 months for CPE, annual labs

## 2018-01-13 NOTE — Assessment & Plan Note (Signed)
The 10-year ASCVD risk score Mikey Bussing DC Jr., et al., 2013) is: 7.2%   Values used to calculate the score:     Age: 67 years     Sex: Female     Is Non-Hispanic African American: No     Diabetic: No     Tobacco smoker: No     Systolic Blood Pressure: 165 mmHg     Is BP treated: No     HDL Cholesterol: 42.6 mg/dL     Total Cholesterol: 268 mg/dL Patient continues to say she does not want to take a Statin, per ascvd score is not truly indicated at this time Discussed the role of healthy diet and exercise in the management of high cholesterol-See AVS for additional information provided to patient Will continue to monitor annually

## 2018-02-03 ENCOUNTER — Other Ambulatory Visit: Payer: Self-pay | Admitting: Neurology

## 2018-02-03 MED ORDER — PRIMIDONE 50 MG PO TABS: 150.0000 mg | ORAL_TABLET | Freq: Two times a day (BID) | ORAL | 1 refills | Status: DC

## 2018-02-27 ENCOUNTER — Ambulatory Visit: Payer: Medicare Other | Admitting: Nurse Practitioner

## 2018-04-25 LAB — HM MAMMOGRAPHY

## 2018-05-16 NOTE — Progress Notes (Signed)
Subjective:   [name] was seen in f/u in the movement disorder clinic at the request of Lance Sell, NP.  The f/u is for essential tremor.  The patient is a 67 y.o. right handed female with a history of tremor.Onset of symptoms was in high school. Tremor primarily involves the bilateral hand.  The tremor is symmetric.   She notices the right hand more than the L.   Symptoms are currently of moderate severity. Tremor exacerbated by stress.  She is not sure if caffeine will affect tremor (she drinks 2 cups of tea per day).  She rarely drinks soda and no coffee in the home.   She has not noted improvement with alcohol.  She has trouble with soup.  She will try to use 2 hands to eat or drink her soup.  She has trouble pouring liquids.    She never notices the tremor at rest.  She has no voice tremor.  She has good balance.  Her mother has tremor as well.  She may have noted a head tremor a few times.  The patient was recently started on 08/09/2012 on Propranolol LA 60 mg daily.  Last visit, we tried to increase the dose to twice a day dosing but she had significant fatigue with that.  She is not taking it twice a day every other day and on the opposite day she is just taking it once a day.  She thinks that it has helped some but not significantly.  She states that she quit working out and has really had poor motivation to get out of the house.  She is not sure that it is from the medication.  She states she has not been eating right lately either.  She has gained a few pounds.  06/17/16 update:  The patient is seen back in the neurology clinic today, but I have not seen her in nearly 4 years.  She reports worsening of tremor over that time.  Tremor is located in both hands equally and is most evident when she uses the hand.  She notices it especially when writing a check in public.  If using plastic utensils or having to eat soup, it is not good.  Her mother has tremor but is 40 y/o and pts  tremor is worse than mothers.  She is R hand dominant.  She has tried propranolol, but is unable to tolerate higher dosages, and lower dosages have not been efficacious.  When I saw her nearly 4 years ago, she had just tried primidone but had nausea with it and "just felt bad" and couldn't continue it more than a few days before she d/c it.  Reports today that she thinks that she would like to try the propranolol again but she didn't remember the SE that she had in the past until I read her old notes to her.  09/21/16 update:  The patient follows up today.  We started her on primidone last visit.  We started it very slowly and she is only on 50 mg at night.  No SE this time.  Tremor may be a little better but not enough better.    01/19/17 update:  Patient follows up today.  She is now on primidone 50 mg twice per day.  She states that it is helping some but not enough and she would like to increase further.  She is having no SE.    04/27/17 update:  Patient seen today in follow-up.  I increased her primidone last visit so that she was taking 50 mg in the morning and 100 mg at night.  She states that "it is probably a little better."  She denies any side effects with the medication.  No new medical problems or medications since last visit.  08/30/17 update: Patient is seen today in follow-up for essential tremor.  She is now on primidone, 100 mg twice per day.  She states that she has no side effects with the medication.  She states that she has good and bad days.  She recently threw her drink (peach bellini) at her sister when trying to toast her.  01/10/18 update: Patient is seen today in follow-up for essential tremor.  She is on primidone, 100 mg twice per day.  We added gabapentin last visit, 300 mg twice per day.  She reports that she didn't think that gabapentin helps.  Handwriting and eating is still troublesome.  05/17/18 update: Patient is seen today in follow-up for essential tremor.  Primidone  was increased last visit so that she is on 150 mg twice per day.  Gabapentin was discontinued.  She reports that she is doing about the same. Still with bothersome tremor.  Records have been reviewed since last visit.  She saw her nurse practitioner a few days after she saw me last visit.  Current/Previously tried tremor medications: propranolol LA and primidone (SE at low dosages for both); gabapentin  Current medications that may exacerbate tremor:  none   Allergies  Allergen Reactions  . Codeine Nausea Only    REACTION: nausea  . Hydrocodone-Acetaminophen Nausea Only  . Morphine And Related Nausea Only    Intense nausea    Current Outpatient Medications on File Prior to Visit  Medication Sig Dispense Refill  . ALPRAZolam (XANAX) 0.25 MG tablet Take 1 tablet (0.25 mg total) by mouth 2 (two) times daily as needed for anxiety or sleep. 60 tablet 2  . Cholecalciferol (VITAMIN D3) 1000 UNITS CAPS Take by mouth daily.      . mirtazapine (REMERON) 15 MG tablet Take 1 tablet (15 mg total) by mouth at bedtime. 90 tablet 1  . polyethylene glycol powder (MIRALAX) powder Take 17 g by mouth 3 (three) times a week.     . primidone (MYSOLINE) 50 MG tablet Take 3 tablets (150 mg total) by mouth 2 (two) times daily. 540 tablet 1  . Risedronate Sodium 35 MG TBEC   1   No current facility-administered medications on file prior to visit.     Past Medical History:  Diagnosis Date  . Actinic keratosis   . ALLERGIC RHINITIS   . ANEMIA-NOS   . Anxiety   . Cataract 2014   left cataract extraction with lens implant  . DEPRESSION   . FIBROIDS, UTERUS   . HYPERLIPIDEMIA   . Macular degeneration of both eyes 2012   mild  . 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 Neuromuscular disorder ([place]) 2014   hand tremors  . OSTEOPOROSIS 2002   on bisphos  . Sarcoidosis 2009    Past Surgical  History:  Procedure Laterality Date  . BREAST SURGERY     biospy  . CATARACT EXTRACTION Left 07/2013   dr Herbert Deaner  . Child birth  21  . COLONOSCOPY  2011  . STOMACH SURGERY  11/2008   2-3cm GIST excision  . TONSILLECTOMY  1957  .  TUBAL LIGATION    . UPPER GASTROINTESTINAL ENDOSCOPY      Social History   Socioeconomic History  . Marital status: Single    Spouse name: Not on file  . Number of children: Not on file  . Years of education: Not on file  . Highest education level: Not on file  Occupational History  . 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 Financial resource strain: Not on file  . Food insecurity:    Worry: Not on file    Inability: Not on file  . Transportation needs:    Medical: Not on file    Non-medical: Not on file  Tobacco Use  . Smoking status: Never Smoker  . Smokeless tobacco: Never Used  Substance and Sexual Activity  . Alcohol use: Yes    Alcohol/week: 1.8 - 2.4 oz    Types: 3 - 4 Glasses of wine per week  . Drug use: No  . Sexual activity: Not on file  Lifestyle  . Physical activity:    Days per week: Not on file    Minutes per session: Not on file  . Stress: Not on file  Relationships  . Social connections:    Talks on phone: Not on file    Gets together: Not on file    Attends religious service: Not on file    Active member of club or organization: Not on file    Attends meetings of clubs or organizations: Not on file    Relationship status: Not on file  . Intimate partner violence:    Fear of current or ex partner: Not on file    Emotionally abused: Not on file    Physically abused: Not on file    Forced sexual activity: Not on file  Other Topics Concern  . Not on file  Social History Narrative   Divorced, lives alone. Pt has 1 dtr. 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 Mother  Alive        colorectal CA  . Father  Deceased       hodgkins disease  . Sister  Alive       healthy  . Daughter  (Not Specified)  . Mat Aunt  (Not Specified)  . Mat Uncle  (Not Specified)  . MGF  (Not Specified)  . Neg Hx  (Not Specified)    Review of Systems   A complete 10 system ROS was obtained and was negative apart from what is mentioned.   Objective:   VITALS:   Vitals:   05/17/18 0940  BP: 120/64  Pulse: 88  SpO2: 95%  Weight: 126 lb (57.2 kg)  Height: 5' 4.5" (1.638 m)   Gen:  Appears stated age and in NAD. HEENT:  Normocephalic, atraumatic. The mucous membranes are moist. The superficial temporal arteries are without ropiness or tenderness. Cardiovascular: Regular rate and rhythm. Lungs: Clear to auscultation bilaterally. Neck: There are no carotid bruits noted bilaterally.  NEUROLOGICAL:  Orientation:  The patient is alert and oriented x 3.   Cranial nerves: There is good facial symmetry.  Speech is fluent and clear. Soft palate rises symmetrically and there is no tongue deviation. Hearing is intact to conversational tone. Tone: Tone is good throughout. Sensation: Sensation is intact to light touch throughout. Coordination:  The patient has  no dysdiadichokinesia or dysmetria. Motor: Strength is 5/5 in the bilateral upper and lower extremities.  Shoulder shrug is equal bilaterally.  There is no pronator drift.  There are no fasciculations noted. Gait and Station: The patient is able to ambulate without difficulty.   MOVEMENT EXAM: Tremor:  There is mild to mod tremor of the outstretched hands.   she has some difficulty with archimedes spirals. Mild rest tremor on the right    LABS:  Lab Results  Component Value Date   TSH 2.67 10/28/2015     Chemistry      Component Value Date/Time   NA 139 06/30/2017 0832   K 4.8 06/30/2017 0832   CL 103 06/30/2017 0832   CO2 30 06/30/2017 0832   BUN 9 06/30/2017 0832   CREATININE 0.78 06/30/2017 0832      Component  Value Date/Time   CALCIUM 9.3 06/30/2017 0832   ALKPHOS 45 06/30/2017 0832   AST 15 06/30/2017 0832   ALT 12 06/30/2017 0832   BILITOT 0.4 06/30/2017 0832       Assessment/Plan   1.  Essential tremor.   This is evidenced by longstanding history and most evident with activation and some with intention.  There is also a fam hx of tremor in her mother.   She has a mild resting component on the R but this can be seen in longstanding ET.  She has no bradykinesia.  She has been unable to tolerate effective dosages of Inderal LA.  However, she was on inderal LA 60 mg qday to bid now.  She wants to retry that now.  No longer exercising.  Will retry at lower dosages.  Will try 10 mg bid.  Can go higher if needed  -continue primidone  50 mg - 3 po bid.  Risks, benefits, side effects and alternative therapies were discussed.  The opportunity to ask questions was given and they were answered to the best of my ability.  The patient expressed understanding and willingness to follow the outlined treatment protocols.  -info on ET foundation given  -discussed weighted gloves  2. F/u 4 months

## 2018-05-17 ENCOUNTER — Ambulatory Visit: Payer: Medicare Other | Admitting: Neurology

## 2018-05-17 ENCOUNTER — Encounter: Payer: Self-pay | Admitting: Neurology

## 2018-05-17 VITALS — BP 120/64 | HR 88 | Ht 64.5 in | Wt 126.0 lb

## 2018-05-17 DIAGNOSIS — G250 Essential tremor: Secondary | ICD-10-CM

## 2018-05-17 MED ORDER — PROPRANOLOL HCL 10 MG PO TABS: 10.0000 mg | ORAL_TABLET | Freq: Two times a day (BID) | ORAL | 1 refills | Status: DC

## 2018-05-17 NOTE — Patient Instructions (Addendum)
Start propranolol 10 mg twice per day.  Let me know if you have any problems (lightheadedness/dizziness/near fainting/too much fatigue, etc)  Continue primidone 50 mg - 3 tablets twice per day  Look at the international essential tremor foundation website  Good to see you!

## 2018-06-23 ENCOUNTER — Telehealth: Payer: Self-pay | Admitting: Neurology

## 2018-06-23 NOTE — Telephone Encounter (Signed)
She is on a really, really low dose (10 mg bid).  We can try to stop it but unfortunately not a lot of other options (or good options) for her tremor

## 2018-06-23 NOTE — Telephone Encounter (Signed)
Left message giving patient instructions and requested for her to call with any other questions.

## 2018-06-23 NOTE — Telephone Encounter (Signed)
Patient called stating she's been on propranolol for 5 weeks and she is seeming to be more tired than normal. Please call her back with any advice at [phone] and you leave detailed message. Thanks.

## 2018-07-13 ENCOUNTER — Other Ambulatory Visit: Payer: Self-pay | Admitting: Nurse Practitioner

## 2018-07-13 DIAGNOSIS — R5383 Other fatigue: Secondary | ICD-10-CM

## 2018-07-13 DIAGNOSIS — F411 Generalized anxiety disorder: Secondary | ICD-10-CM

## 2018-07-13 NOTE — Telephone Encounter (Signed)
LOV w/you:  01/13/18 Next OV w/you: NONE

## 2018-07-27 ENCOUNTER — Ambulatory Visit (INDEPENDENT_AMBULATORY_CARE_PROVIDER_SITE_OTHER): Payer: Medicare Other | Admitting: Nurse Practitioner

## 2018-07-27 ENCOUNTER — Encounter: Payer: Self-pay | Admitting: Nurse Practitioner

## 2018-07-27 VITALS — BP 130/80 | HR 93 | Temp 99.1°F | Ht 64.5 in | Wt 123.0 lb

## 2018-07-27 DIAGNOSIS — B9789 Other viral agents as the cause of diseases classified elsewhere: Secondary | ICD-10-CM

## 2018-07-27 DIAGNOSIS — J069 Acute upper respiratory infection, unspecified: Secondary | ICD-10-CM

## 2018-07-27 MED ORDER — BENZONATATE 100 MG PO CAPS: 100.0000 mg | ORAL_CAPSULE | Freq: Two times a day (BID) | ORAL | 0 refills | Status: DC | PRN

## 2018-07-27 MED ORDER — AZITHROMYCIN 250 MG PO TABS: ORAL_TABLET | ORAL | 0 refills | Status: DC

## 2018-07-27 NOTE — Patient Instructions (Addendum)
Please start flonase 2 sprays in each nostril for the next 1 week. I have sent prescription for tessalon as needed for your cough For nasal and head congestion may take Sudafed PE 10 mg every 4 hour as needed. Saline nasal spray used frequently. For drainage may use Allegra, Claritin or Zyrtec. If you need stronger medicine to stop drainage may take Chlor-Trimeton 2-4 mg every 4 hours. This may cause drowsiness. Ibuprofen 600 mg every 6 hours as needed for pain, discomfort or fever. Drink plenty of fluids and stay well-hydrated.  If you are still feeling bad this weekend, you can start antibiotic as discussed.   Upper Respiratory Infection, Adult Most upper respiratory infections (URIs) are caused by a virus. A URI affects the nose, throat, and upper air passages. The most common type of URI is often called "the common cold." Follow these instructions at home:  Take medicines only as told by your doctor.  Gargle warm saltwater or take cough drops to comfort your throat as told by your doctor.  Use a warm mist humidifier or inhale steam from a shower to increase air moisture. This may make it easier to breathe.  Drink enough fluid to keep your pee (urine) clear or pale yellow.  Eat soups and other clear broths.  Have a healthy diet.  Rest as needed.  Go back to work when your fever is gone or your doctor says it is okay. ? You may need to stay home longer to avoid giving your URI to others. ? You can also wear a face mask and wash your hands often to prevent spread of the virus.  Use your inhaler more if you have asthma.  Do not use any tobacco products, including cigarettes, chewing tobacco, or electronic cigarettes. If you need help quitting, ask your doctor. Contact a doctor if:  You are getting worse, not better.  Your symptoms are not helped by medicine.  You have chills.  You are getting more short of breath.  You have brown or red mucus.  You have yellow or  brown discharge from your nose.  You have pain in your face, especially when you bend forward.  You have a fever.  You have puffy (swollen) neck glands.  You have pain while swallowing.  You have white areas in the back of your throat. Get help right away if:  You have very bad or constant: ? Headache. ? Ear pain. ? Pain in your forehead, behind your eyes, and over your cheekbones (sinus pain). ? Chest pain.  You have long-lasting (chronic) lung disease and any of the following: ? Wheezing. ? Long-lasting cough. ? Coughing up blood. ? A change in your usual mucus.  You have a stiff neck.  You have changes in your: ? Vision. ? Hearing. ? Thinking. ? Mood. This information is not intended to replace advice given to you by your health care provider. Make sure you discuss any questions you have with your health care provider. Document Released: 04/05/2008 Document Revised: 06/20/2016 Document Reviewed: 01/23/2014 Elsevier Interactive Patient Education  2018 [employer].

## 2018-07-27 NOTE — Progress Notes (Signed)
Name: [name]   MRN: [id number]    DOB: [birth year]   Date:07/27/2018       Progress Note  Subjective  Chief Complaint Cold symptoms  HPI  Ms Top is here today for evaluation of cold/URI symptoms, which first began around this past Saturday, today is day 6 of her symptoms and she says she is just feeling worse and worse, started to have fevers over past 2-3 days and cant sleep at night due to cough She reports headaches, nasal congestion. She was initially having sore throat but this is getting better She denies chest pain, shortness of breath, abdominal pain, nausea, vomiting. She has been using throat Lozenges, OTC cold remedies, advil which do not really seem to help her symptoms  Patient Active Problem List   Diagnosis Date Noted  . Medicare annual wellness visit, initial 06/14/2016  . Routine general medical examination at a health care facility 06/14/2016  . Caregiver with fatigue 03/30/2016  . Malignant GIST (gastrointestinal stromal tumor) ([place])   . Anxiety state 04/09/2015  . Essential tremor   . Osteoporosis 09/22/2009  . FIBROIDS, UTERUS 09/10/2009  . ANEMIA-NOS 09/10/2009  . DEPRESSION 09/10/2009  . ALLERGIC RHINITIS 09/10/2009  . Hyperlipidemia 10/16/2008    Social History   Tobacco Use  . Smoking status: Never Smoker  . Smokeless tobacco: Never Used  Substance Use Topics  . Alcohol use: Yes    Alcohol/week: 3.0 - 4.0 standard drinks    Types: 3 - 4 Glasses of wine per week     Current Outpatient Medications:  .  ALPRAZolam (XANAX) 0.25 MG tablet, Take 1 tablet (0.25 mg total) by mouth 2 (two) times daily as needed for anxiety or sleep., Disp: 60 tablet, Rfl: 2 .  Cholecalciferol (VITAMIN D3) 1000 UNITS CAPS, Take by mouth daily.  , Disp: , Rfl:  .  mirtazapine (REMERON) 15 MG tablet, TAKE 1 TABLET(15 MG) BY MOUTH AT BEDTIME, Disp: 90 tablet, Rfl: 0 .  polyethylene glycol powder (MIRALAX) powder, Take 17 g by mouth 3 (three) times a week. , Disp: ,  Rfl:  .  primidone (MYSOLINE) 50 MG tablet, Take 3 tablets (150 mg total) by mouth 2 (two) times daily., Disp: 540 tablet, Rfl: 1 .  propranolol (INDERAL) 10 MG tablet, Take 1 tablet (10 mg total) by mouth 2 (two) times daily., Disp: 180 tablet, Rfl: 1 .  Risedronate Sodium 35 MG TBEC, , Disp: , Rfl: 1 .  azithromycin (ZITHROMAX) 250 MG tablet, 2 tablets on day 1, then 1 tablet daily until complete, Disp: 6 tablet, Rfl: 0 .  benzonatate (TESSALON) 100 MG capsule, Take 1 capsule (100 mg total) by mouth 2 (two) times daily as needed for cough., Disp: 20 capsule, Rfl: 0  Allergies  Allergen Reactions  . Codeine Nausea Only    REACTION: nausea  . Hydrocodone-Acetaminophen Nausea Only  . Morphine And Related Nausea Only    Intense nausea    ROS  No other specific complaints in a complete review of systems (except as listed in HPI above).  Objective  Vitals:   07/27/18 0826  BP: 130/80  Pulse: 93  Temp: 99.1 F (37.3 C)  TempSrc: Oral  SpO2: 96%  Weight: 123 lb (55.8 kg)  Height: 5' 4.5" (1.638 m)    Body mass index is 20.79 kg/m.  Nursing Note and Vital Signs reviewed.  Physical Exam  Constitutional: Patient appears well-developed and well-nourished.  No distress.  HEENT: head atraumatic, normocephalic, pupils equal and  reactive to light, EOM's intact, TM's without erythema or bulging, no maxillary or frontal sinus tenderness , neck supple without lymphadenopathy, oropharynx pink and moist without exudate Cardiovascular: Normal rate, regular rhythm, S1/S2 present.  Distal pulses intact Pulmonary/Chest: Effort normal and breath sounds clear. No respiratory distress or retractions. Neurological: She is alert and oriented to person, place, and time. No cranial nerve deficit. Coordination, balance, strength, speech and gait are normal.  Skin: Skin is warm and dry. No rash noted. No erythema.  Psychiatric: Patient has a normal mood and affect. behavior is normal. Judgment and  thought content normal.    Assessment & Plan  1. Viral URI with cough Likely viral at this point but due to worsening symptoms including new fever and going into the weekend, I have provided her with prescription for z-pak with instructions to begin this weekend if her symptoms have not improved, also sent tessalon for cough- dosing and side effects discussed -home management, Red flags and when to present for emergency care or RTC including fever >101.44F, chest pain, shortness of breath, new/worsening/un-resolving symptoms, reviewed with patient at time of visit. Follow up and care instructions discussed and provided in AVS. - benzonatate (TESSALON) 100 MG capsule; Take 1 capsule (100 mg total) by mouth 2 (two) times daily as needed for cough.  Dispense: 20 capsule; Refill: 0 - azithromycin (ZITHROMAX) 250 MG tablet; 2 tablets on day 1, then 1 tablet daily until complete  Dispense: 6 tablet; Refill: 0

## 2018-07-31 ENCOUNTER — Other Ambulatory Visit: Payer: Self-pay | Admitting: Neurology

## 2018-08-16 NOTE — Progress Notes (Signed)
Subjective:   [name] is a 67 y.o. female who presents for Medicare Annual (Subsequent) preventive examination.  Review of Systems:  No ROS.  Medicare Wellness Visit. Additional risk factors are reflected in the social history.  Cardiac Risk Factors include: advanced age (>79men, >67 women);dyslipidemia;hypertension Sleep patterns: feels rested on waking, gets up 1 times nightly to void and sleeps 8-9 hours nightly.    Home Safety/Smoke Alarms: Feels safe in home. Smoke alarms in place.  Living environment; residence and Firearm Safety: 1-story house/ trailer, no firearms Lives alone, no needs for DME, good support system. Seat Belt Safety/Bike Helmet: Wears seat belt.     Objective:     Vitals: BP 128/62   Pulse 75   Resp 17   Ht 5\' 5"  (1.651 m)   Wt 125 lb (56.7 kg)   SpO2 99%   BMI 20.80 kg/m   Body mass index is 20.8 kg/m.  Advanced Directives 08/17/2018 06/11/2016 05/28/2016 10/22/2014  Does Patient Have a Medical Advance Directive? Yes Yes Yes Yes  Type of Paramedic of [place];Living will - [place];Living will [place];Living will  Does patient want to make changes to medical advance directive? - - - No - Patient declined  Copy of [place] in Chart? No - copy requested - - Yes    Tobacco Social History   Tobacco Use  Smoking Status Never Smoker  Smokeless Tobacco Never Used     Counseling given: Not Answered  Past Medical History:  Diagnosis Date  . Actinic keratosis   . ALLERGIC RHINITIS   . ANEMIA-NOS   . Anxiety   . Cataract 2014   left cataract extraction with lens implant  . DEPRESSION   . FIBROIDS, UTERUS   . HYPERLIPIDEMIA   . Macular degeneration of both eyes 2012   mild  . 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 Neuromuscular disorder  ([place]) 2014   hand tremors  . OSTEOPOROSIS 2002   on bisphos  . Sarcoidosis 2009   Past Surgical History:  Procedure Laterality Date  . BREAST SURGERY     bi[facility]  . CATARACT EXTRACTION Left 07/2013   dr Herbert Deaner  . Child birth  59  . COLONOSCOPY  2011  . STOMACH SURGERY  11/2008   2-3cm GIST excision  . TONSILLECTOMY  1957  . TUBAL LIGATION    . UPPER GASTROINTESTINAL ENDOSCOPY     Family History  Problem Relation Age of Onset  . Allergies Mother   . Colon cancer Mother   . Tremor Mother   . Hodgkin's lymphoma Father 41  . Allergies Daughter   . Breast cancer Maternal Aunt   . Prostate cancer Maternal Uncle   . Esophageal cancer Maternal Grandfather   . Stomach cancer Neg Hx    Social History   Socioeconomic History  . Marital status: Single    Spouse name: Not on file  . Number of children: Not on file  . Years of education: Not on file  . Highest education level: Not on file  Occupational History  . 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 Financial resource strain: Not hard at all  . Food insecurity:    Worry: Never true    Inability: Never true  . Transportation needs:  Medical: No    Non-medical: No  Tobacco Use  . Smoking status: Never Smoker  . Smokeless tobacco: Never Used  Substance and Sexual Activity  . Alcohol use: Yes    Alcohol/week: 3.0 - 4.0 standard drinks    Types: 3 - 4 Glasses of wine per week  . Drug use: No  . Sexual activity: Not Currently  Lifestyle  . Physical activity:    Days per week: 0 days    Minutes per session: 0 min  . Stress: Not at all  Relationships  . Social connections:    Talks on phone: More than three times a week    Gets together: More than three times a week    Attends religious service: 1 to 4 times per year    Active member of club or organization: Yes    Attends meetings of clubs or organizations: More than 4 times per year    Relationship status: Not on  file  Other Topics Concern  . Not on file  Social History Narrative   Divorced, lives alone. Pt has 1 dtr. 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 ALPRAZolam (XANAX) 0.25 MG tablet Take 1 tablet (0.25 mg total) by mouth 2 (two) times daily as needed for anxiety or sleep.  . Cholecalciferol (VITAMIN D3) 1000 UNITS CAPS Take by mouth daily.    . mirtazapine (REMERON) 15 MG tablet TAKE 1 TABLET(15 MG) BY MOUTH AT BEDTIME  . polyethylene glycol powder (MIRALAX) powder Take 17 g by mouth 3 (three) times a week.   . primidone (MYSOLINE) 50 MG tablet TAKE 3 TABLETS BY MOUTH TWICE DAILY.  Marland Kitchen propranolol (INDERAL) 10 MG tablet Take 1 tablet (10 mg total) by mouth 2 (two) times daily.  . Risedronate Sodium 35 MG TBEC   . [DISCONTINUED] azithromycin (ZITHROMAX) 250 MG tablet 2 tablets on day 1, then 1 tablet daily until complete (Patient not taking: Reported on 08/17/2018)  . [DISCONTINUED] benzonatate (TESSALON) 100 MG capsule Take 1 capsule (100 mg total) by mouth 2 (two) times daily as needed for cough. (Patient not taking: Reported on 08/17/2018)   No facility-administered encounter medications on file as of 08/17/2018.     Activities of Daily Living In your present state of health, do you have any difficulty performing the following activities: 08/17/2018  Hearing? N  Vision? N  Difficulty concentrating or making decisions? N  Walking or climbing stairs? N  Dressing or bathing? N  Doing errands, shopping? N  Preparing Food and eating ? N  Using the Toilet? N  In the past six months, have you accidently leaked urine? N  Do you have problems with loss of bowel control? N  Managing your Medications? N  Managing your Finances? N  Housekeeping or managing your Housekeeping? N  Some recent data might be hidden    Patient Care Team: Lance Sell, NP as PCP -  General (Nurse Practitioner) Milus Banister, MD (Gastroenterology) Tat, Eustace Quail, DO (Neurology) Avon Gully, NP (Obstetrics and Gynecology) Monna Fam, MD (Ophthalmology)    Assessment:   This is a routine wellness examination for [place]. Physical assessment deferred to PCP.   Exercise Activities and Dietary recommendations Current Exercise Habits: The patient does not participate in regular exercise at present, Exercise limited by: None identified  Diet (meal preparation, eat out, water intake, caffeinated beverages,  dairy products, fruits and vegetables): in general, a "healthy" diet  , well balanced, vegetarian primarily.  Reviewed heart healthy diet. Encouraged patient to increase daily water and healthy fluid intake.  Goals    . Patient Stated     I want to increase my physical activity by getting motivated and go back to the gym.        Fall Risk Fall Risk  05/17/2018 01/10/2018 08/30/2017 06/29/2017 04/27/2017  Falls in the past year? No No No No No    Depression Screen PHQ 2/9 Scores 06/29/2017 06/14/2016 03/30/2016 10/03/2013  PHQ - 2 Score 0 0 2 0  PHQ- 9 Score - - 9 -     Cognitive Function       Ad8 score reviewed for issues:  Issues making decisions: no  Less interest in hobbies / activities: no  Repeats questions, stories (family complaining): no  Trouble using ordinary gadgets (microwave, computer, phone):no  Forgets the month or year: no  Mismanaging finances: no  Remembering appts: no  Daily problems with thinking and/or memory: no Ad8 score is= 0  Immunization History  Administered Date(s) Administered  . Influenza Split 10/12/2011, 07/27/2013  . Influenza, High Dose Seasonal PF 06/29/2017  . Influenza,inj,Quad PF,6+ Mos 07/18/2014  . Influenza-Unspecified 07/31/2015  . Pneumococcal Conjugate-13 06/14/2016  . Td 11/02/2003  . Tdap 11/06/2013  . Zoster 03/28/2013   Screening Tests Health Maintenance  Topic Date Due  . PNA  vac Low Risk Adult (2 of 2 - PPSV23) 06/14/2017  . MAMMOGRAM  03/10/2018  . INFLUENZA VACCINE  01/31/2019 (Originally 06/01/2018)  . TETANUS/TDAP  11/07/2023  . COLONOSCOPY  06/11/2026  . DEXA SCAN  Completed  . Hepatitis C Screening  Completed      Plan:     Continue doing brain stimulating activities (puzzles, reading, adult coloring books, staying active) to keep memory sharp.   Continue to eat heart healthy diet (full of fruits, vegetables, whole grains, lean protein, water--limit salt, fat, and sugar intake) and increase physical activity as tolerated.  I have personally reviewed and noted the following in the patient's chart:   . Medical and social history . Use of alcohol, tobacco or illicit drugs  . Current medications and supplements . Functional ability and status . Nutritional status . Physical activity . Advanced directives . List of other physicians . Vitals . Screenings to include cognitive, depression, and falls . Referrals and appointments  In addition, I have reviewed and discussed with patient certain preventive protocols, quality metrics, and best practice recommendations. A written personalized care plan for preventive services as well as general preventive health recommendations were provided to patient.     Michiel Cowboy, RN  08/17/2018

## 2018-08-17 ENCOUNTER — Ambulatory Visit (INDEPENDENT_AMBULATORY_CARE_PROVIDER_SITE_OTHER): Payer: Medicare Other | Admitting: *Deleted

## 2018-08-17 ENCOUNTER — Telehealth: Payer: Self-pay | Admitting: *Deleted

## 2018-08-17 VITALS — BP 128/62 | HR 75 | Resp 17 | Ht 65.0 in | Wt 125.0 lb

## 2018-08-17 DIAGNOSIS — Z0000 Encounter for general adult medical examination without abnormal findings: Secondary | ICD-10-CM

## 2018-08-17 MED ORDER — ALPRAZOLAM 0.25 MG PO TABS: 0.2500 mg | ORAL_TABLET | Freq: Every day | ORAL | 0 refills | Status: DC | PRN

## 2018-08-17 NOTE — Telephone Encounter (Signed)
Refill sent Can you remind her to schedule an annual follow up with me? Her last annual was with greg last year so she is due. Thanks!

## 2018-08-17 NOTE — Telephone Encounter (Signed)
Noted, thank you

## 2018-08-17 NOTE — Telephone Encounter (Signed)
Called and scheduled an appointment for patient to see PCP for an annual visit on  08/21/18.

## 2018-08-17 NOTE — Telephone Encounter (Signed)
During AWV, patient asked if she could have a prescription refill for xanax.

## 2018-08-17 NOTE — Progress Notes (Signed)
Medical screening examination/treatment/procedure(s) were performed by the Wellness Coach, RN. As primary care provider I was immediately available for consulation/collaboration. I agree with above documentation. Ashleigh Shambley, NP  

## 2018-08-17 NOTE — Patient Instructions (Signed)
Continue doing brain stimulating activities (puzzles, reading, adult coloring books, staying active) to keep memory sharp.   Continue to eat heart healthy diet (full of fruits, vegetables, whole grains, lean protein, water--limit salt, fat, and sugar intake) and increase physical activity as tolerated.   [name] , Thank you for taking time to come for your Medicare Wellness Visit. I appreciate your ongoing commitment to your health goals. Please review the following plan we discussed and let me know if I can assist you in the future.   These are the goals we discussed: Goals    . Patient Stated     I want to increase my physical activity by getting motivated and go back to the gym.        This is a list of the screening recommended for you and due dates:  Health Maintenance  Topic Date Due  . Pneumonia vaccines (2 of 2 - PPSV23) 06/14/2017  . Mammogram  03/10/2018  . Flu Shot  01/31/2019*  . Tetanus Vaccine  11/07/2023  . Colon Cancer Screening  06/11/2026  . DEXA scan (bone density measurement)  Completed  .  Hepatitis C: One time screening is recommended by Center for Disease Control  (CDC) for  adults born from [birth year] through 1965.   Completed  *Topic was postponed. The date shown is not the original due date.   Health Maintenance, Female Adopting a healthy lifestyle and getting preventive care can go a long way to promote health and wellness. Talk with your health care provider about what schedule of regular examinations is right for you. This is a good chance for you to check in with your provider about disease prevention and staying healthy. In between checkups, there are plenty of things you can do on your own. Experts have done a lot of research about which lifestyle changes and preventive measures are most likely to keep you healthy. Ask your health care provider for more information. Weight and diet Eat a healthy diet  Be sure to include plenty of vegetables, fruits,  low-fat dairy products, and lean protein.  Do not eat a lot of foods high in solid fats, added sugars, or salt.  Get regular exercise. This is one of the most important things you can do for your health. ? Most adults should exercise for at least 150 minutes each week. The exercise should increase your heart rate and make you sweat (moderate-intensity exercise). ? Most adults should also do strengthening exercises at least twice a week. This is in addition to the moderate-intensity exercise.  Maintain a healthy weight  Body mass index (BMI) is a measurement that can be used to identify possible weight problems. It estimates body fat based on height and weight. Your health care provider can help determine your BMI and help you achieve or maintain a healthy weight.  For females 20 years of age and older: ? A BMI below 18.5 is considered underweight. ? A BMI of 18.5 to 24.9 is normal. ? A BMI of 25 to 29.9 is considered overweight. ? A BMI of 30 and above is considered obese.  Watch levels of cholesterol and blood lipids  You should start having your blood tested for lipids and cholesterol at 67 years of age, then have this test every 5 years.  You may need to have your cholesterol levels checked more often if: ? Your lipid or cholesterol levels are high. ? You are older than 67 years of age. ? You are at  high risk for heart disease.  Cancer screening Lung Cancer  Lung cancer screening is recommended for adults 92-50 years old who are at high risk for lung cancer because of a history of smoking.  A yearly low-dose CT scan of the lungs is recommended for people who: ? Currently smoke. ? Have quit within the past 15 years. ? Have at least a 30-pack-year history of smoking. A pack year is smoking an average of one pack of cigarettes a day for 1 year.  Yearly screening should continue until it has been 15 years since you quit.  Yearly screening should stop if you develop a health  problem that would prevent you from having lung cancer treatment.  Breast Cancer  Practice breast self-awareness. This means understanding how your breasts normally appear and feel.  It also means doing regular breast self-exams. Let your health care provider know about any changes, no matter how small.  If you are in your 20s or 30s, you should have a clinical breast exam (CBE) by a health care provider every 1-3 years as part of a regular health exam.  If you are 5 or older, have a CBE every year. Also consider having a breast X-ray (mammogram) every year.  If you have a family history of breast cancer, talk to your health care provider about genetic screening.  If you are at high risk for breast cancer, talk to your health care provider about having an MRI and a mammogram every year.  Breast cancer gene (BRCA) assessment is recommended for women who have family members with BRCA-related cancers. BRCA-related cancers include: ? Breast. ? Ovarian. ? Tubal. ? Peritoneal cancers.  Results of the assessment will determine the need for genetic counseling and BRCA1 and BRCA2 testing.  Cervical Cancer Your health care provider may recommend that you be screened regularly for cancer of the pelvic organs (ovaries, uterus, and vagina). This screening involves a pelvic examination, including checking for microscopic changes to the surface of your cervix (Pap test). You may be encouraged to have this screening done every 3 years, beginning at age 47.  For women ages 54-65, health care providers may recommend pelvic exams and Pap testing every 3 years, or they may recommend the Pap and pelvic exam, combined with testing for human papilloma virus (HPV), every 5 years. Some types of HPV increase your risk of cervical cancer. Testing for HPV may also be done on women of any age with unclear Pap test results.  Other health care providers may not recommend any screening for nonpregnant women who are  considered low risk for pelvic cancer and who do not have symptoms. Ask your health care provider if a screening pelvic exam is right for you.  If you have had past treatment for cervical cancer or a condition that could lead to cancer, you need Pap tests and screening for cancer for at least 20 years after your treatment. If Pap tests have been discontinued, your risk factors (such as having a new sexual partner) need to be reassessed to determine if screening should resume. Some women have medical problems that increase the chance of getting cervical cancer. In these cases, your health care provider may recommend more frequent screening and Pap tests.  Colorectal Cancer  This type of cancer can be detected and often prevented.  Routine colorectal cancer screening usually begins at 67 years of age and continues through 67 years of age.  Your health care provider may recommend screening at an earlier  age if you have risk factors for colon cancer.  Your health care provider may also recommend using home test kits to check for hidden blood in the stool.  A small camera at the end of a tube can be used to examine your colon directly (sigmoidoscopy or colonoscopy). This is done to check for the earliest forms of colorectal cancer.  Routine screening usually begins at age 21.  Direct examination of the colon should be repeated every 5-10 years through 67 years of age. However, you may need to be screened more often if early forms of precancerous polyps or small growths are found.  Skin Cancer  Check your skin from head to toe regularly.  Tell your health care provider about any new moles or changes in moles, especially if there is a change in a mole's shape or color.  Also tell your health care provider if you have a mole that is larger than the size of a pencil eraser.  Always use sunscreen. Apply sunscreen liberally and repeatedly throughout the day.  Protect yourself by wearing long  sleeves, pants, a wide-brimmed hat, and sunglasses whenever you are outside.  Heart disease, diabetes, and high blood pressure  High blood pressure causes heart disease and increases the risk of stroke. High blood pressure is more likely to develop in: ? People who have blood pressure in the high end of the normal range (130-139/85-89 mm Hg). ? People who are overweight or obese. ? People who are African American.  If you are 29-95 years of age, have your blood pressure checked every 3-5 years. If you are 47 years of age or older, have your blood pressure checked every year. You should have your blood pressure measured twice-once when you are at a hospital or clinic, and once when you are not at a hospital or clinic. Record the average of the two measurements. To check your blood pressure when you are not at a hospital or clinic, you can use: ? An automated blood pressure machine at a pharmacy. ? A home blood pressure monitor.  If you are between 84 years and 56 years old, ask your health care provider if you should take aspirin to prevent strokes.  Have regular diabetes screenings. This involves taking a blood sample to check your fasting blood sugar level. ? If you are at a normal weight and have a low risk for diabetes, have this test once every three years after 67 years of age. ? If you are overweight and have a high risk for diabetes, consider being tested at a younger age or more often. Preventing infection Hepatitis B  If you have a higher risk for hepatitis B, you should be screened for this virus. You are considered at high risk for hepatitis B if: ? You were born in a country where hepatitis B is common. Ask your health care provider which countries are considered high risk. ? Your parents were born in a high-risk country, and you have not been immunized against hepatitis B (hepatitis B vaccine). ? You have HIV or AIDS. ? You use needles to inject street drugs. ? You live with  someone who has hepatitis B. ? You have had sex with someone who has hepatitis B. ? You get hemodialysis treatment. ? You take certain medicines for conditions, including cancer, organ transplantation, and autoimmune conditions.  Hepatitis C  Blood testing is recommended for: ? Everyone born from [birth year] through 1965. ? Anyone with known risk factors for hepatitis C.  Sexually transmitted infections (STIs)  You should be screened for sexually transmitted infections (STIs) including gonorrhea and chlamydia if: ? You are sexually active and are younger than 67 years of age. ? You are older than 67 years of age and your health care provider tells you that you are at risk for this type of infection. ? Your sexual activity has changed since you were last screened and you are at an increased risk for chlamydia or gonorrhea. Ask your health care provider if you are at risk.  If you do not have HIV, but are at risk, it may be recommended that you take a prescription medicine daily to prevent HIV infection. This is called pre-exposure prophylaxis (PrEP). You are considered at risk if: ? You are sexually active and do not regularly use condoms or know the HIV status of your partner(s). ? You take drugs by injection. ? You are sexually active with a partner who has HIV.  Talk with your health care provider about whether you are at high risk of being infected with HIV. If you choose to begin PrEP, you should first be tested for HIV. You should then be tested every 3 months for as long as you are taking PrEP. Pregnancy  If you are premenopausal and you may become pregnant, ask your health care provider about preconception counseling.  If you may become pregnant, take 400 to 800 micrograms (mcg) of folic acid every day.  If you want to prevent pregnancy, talk to your health care provider about birth control (contraception). Osteoporosis and menopause  Osteoporosis is a disease in which the bones lose  minerals and strength with aging. This can result in serious bone fractures. Your risk for osteoporosis can be identified using a bone density scan.  If you are 73 years of age or older, or if you are at risk for osteoporosis and fractures, ask your health care provider if you should be screened.  Ask your health care provider whether you should take a calcium or vitamin D supplement to lower your risk for osteoporosis.  Menopause may have certain physical symptoms and risks.  Hormone replacement therapy may reduce some of these symptoms and risks. Talk to your health care provider about whether hormone replacement therapy is right for you. Follow these instructions at home:  Schedule regular health, dental, and eye exams.  Stay current with your immunizations.  Do not use any tobacco products including cigarettes, chewing tobacco, or electronic cigarettes.  If you are pregnant, do not drink alcohol.  If you are breastfeeding, limit how much and how often you drink alcohol.  Limit alcohol intake to no more than 1 drink per day for nonpregnant women. One drink equals 12 ounces of beer, 5 ounces of wine, or 1 ounces of hard liquor.  Do not use street drugs.  Do not share needles.  Ask your health care provider for help if you need support or information about quitting drugs.  Tell your health care provider if you often feel depressed.  Tell your health care provider if you have ever been abused or do not feel safe at home. This information is not intended to replace advice given to you by your health care provider. Make sure you discuss any questions you have with your health care provider. Document Released: 05/03/2011 Document Revised: 03/25/2016 Document Reviewed: 07/22/2015 Elsevier Interactive Patient Education  [employer].

## 2018-08-21 ENCOUNTER — Ambulatory Visit (INDEPENDENT_AMBULATORY_CARE_PROVIDER_SITE_OTHER): Payer: Medicare Other | Admitting: Nurse Practitioner

## 2018-08-21 ENCOUNTER — Encounter: Payer: Self-pay | Admitting: Nurse Practitioner

## 2018-08-21 VITALS — BP 120/70 | HR 73 | Ht 65.0 in | Wt 125.0 lb

## 2018-08-21 DIAGNOSIS — E782 Mixed hyperlipidemia: Secondary | ICD-10-CM | POA: Diagnosis not present

## 2018-08-21 DIAGNOSIS — F411 Generalized anxiety disorder: Secondary | ICD-10-CM

## 2018-08-21 DIAGNOSIS — Z0000 Encounter for general adult medical examination without abnormal findings: Secondary | ICD-10-CM | POA: Diagnosis not present

## 2018-08-21 DIAGNOSIS — Z23 Encounter for immunization: Secondary | ICD-10-CM

## 2018-08-21 NOTE — Assessment & Plan Note (Signed)
Stable On xanax PRN, remeron QHS, continue  F/U for new, worsening symptoms

## 2018-08-21 NOTE — Assessment & Plan Note (Signed)
Update labs, she will return when fasting F/U with further recommendations pending lab results, she does not want statin - Lipid panel; Future - CBC; Future

## 2018-08-21 NOTE — Assessment & Plan Note (Signed)
Reviewed annual screening exams, healthy lifestyle additional information provided on AVS - Lipid panel; Future - CBC; Future - Comprehensive metabolic panel; Future

## 2018-08-21 NOTE — Patient Instructions (Signed)
Please return for labs when fasting  I will see you back in 1 year, or sooner if needed  Health Maintenance, Female Adopting a healthy lifestyle and getting preventive care can go a long way to promote health and wellness. Talk with your health care provider about what schedule of regular examinations is right for you. This is a good chance for you to check in with your provider about disease prevention and staying healthy. In between checkups, there are plenty of things you can do on your own. Experts have done a lot of research about which lifestyle changes and preventive measures are most likely to keep you healthy. Ask your health care provider for more information. Weight and diet Eat a healthy diet  Be sure to include plenty of vegetables, fruits, low-fat dairy products, and lean protein.  Do not eat a lot of foods high in solid fats, added sugars, or salt.  Get regular exercise. This is one of the most important things you can do for your health. ? Most adults should exercise for at least 150 minutes each week. The exercise should increase your heart rate and make you sweat (moderate-intensity exercise). ? Most adults should also do strengthening exercises at least twice a week. This is in addition to the moderate-intensity exercise.  Maintain a healthy weight  Body mass index (BMI) is a measurement that can be used to identify possible weight problems. It estimates body fat based on height and weight. Your health care provider can help determine your BMI and help you achieve or maintain a healthy weight.  For females 32 years of age and older: ? A BMI below 18.5 is considered underweight. ? A BMI of 18.5 to 24.9 is normal. ? A BMI of 25 to 29.9 is considered overweight. ? A BMI of 30 and above is considered obese.  Watch levels of cholesterol and blood lipids  You should start having your blood tested for lipids and cholesterol at 67 years of age, then have this test every 5  years.  You may need to have your cholesterol levels checked more often if: ? Your lipid or cholesterol levels are high. ? You are older than 67 years of age. ? You are at high risk for heart disease.  Cancer screening Lung Cancer  Lung cancer screening is recommended for adults 25-69 years old who are at high risk for lung cancer because of a history of smoking.  A yearly low-dose CT scan of the lungs is recommended for people who: ? Currently smoke. ? Have quit within the past 15 years. ? Have at least a 30-pack-year history of smoking. A pack year is smoking an average of one pack of cigarettes a day for 1 year.  Yearly screening should continue until it has been 15 years since you quit.  Yearly screening should stop if you develop a health problem that would prevent you from having lung cancer treatment.  Breast Cancer  Practice breast self-awareness. This means understanding how your breasts normally appear and feel.  It also means doing regular breast self-exams. Let your health care provider know about any changes, no matter how small.  If you are in your 20s or 30s, you should have a clinical breast exam (CBE) by a health care provider every 1-3 years as part of a regular health exam.  If you are 44 or older, have a CBE every year. Also consider having a breast X-ray (mammogram) every year.  If you have a family  history of breast cancer, talk to your health care provider about genetic screening.  If you are at high risk for breast cancer, talk to your health care provider about having an MRI and a mammogram every year.  Breast cancer gene (BRCA) assessment is recommended for women who have family members with BRCA-related cancers. BRCA-related cancers include: ? Breast. ? Ovarian. ? Tubal. ? Peritoneal cancers.  Results of the assessment will determine the need for genetic counseling and BRCA1 and BRCA2 testing.  Cervical Cancer Your health care provider may  recommend that you be screened regularly for cancer of the pelvic organs (ovaries, uterus, and vagina). This screening involves a pelvic examination, including checking for microscopic changes to the surface of your cervix (Pap test). You may be encouraged to have this screening done every 3 years, beginning at age 83.  For women ages 16-65, health care providers may recommend pelvic exams and Pap testing every 3 years, or they may recommend the Pap and pelvic exam, combined with testing for human papilloma virus (HPV), every 5 years. Some types of HPV increase your risk of cervical cancer. Testing for HPV may also be done on women of any age with unclear Pap test results.  Other health care providers may not recommend any screening for nonpregnant women who are considered low risk for pelvic cancer and who do not have symptoms. Ask your health care provider if a screening pelvic exam is right for you.  If you have had past treatment for cervical cancer or a condition that could lead to cancer, you need Pap tests and screening for cancer for at least 20 years after your treatment. If Pap tests have been discontinued, your risk factors (such as having a new sexual partner) need to be reassessed to determine if screening should resume. Some women have medical problems that increase the chance of getting cervical cancer. In these cases, your health care provider may recommend more frequent screening and Pap tests.  Colorectal Cancer  This type of cancer can be detected and often prevented.  Routine colorectal cancer screening usually begins at 67 years of age and continues through 67 years of age.  Your health care provider may recommend screening at an earlier age if you have risk factors for colon cancer.  Your health care provider may also recommend using home test kits to check for hidden blood in the stool.  A small camera at the end of a tube can be used to examine your colon directly  (sigmoidoscopy or colonoscopy). This is done to check for the earliest forms of colorectal cancer.  Routine screening usually begins at age 43.  Direct examination of the colon should be repeated every 5-10 years through 67 years of age. However, you may need to be screened more often if early forms of precancerous polyps or small growths are found.  Skin Cancer  Check your skin from head to toe regularly.  Tell your health care provider about any new moles or changes in moles, especially if there is a change in a mole's shape or color.  Also tell your health care provider if you have a mole that is larger than the size of a pencil eraser.  Always use sunscreen. Apply sunscreen liberally and repeatedly throughout the day.  Protect yourself by wearing long sleeves, pants, a wide-brimmed hat, and sunglasses whenever you are outside.  Heart disease, diabetes, and high blood pressure  High blood pressure causes heart disease and increases the risk of stroke.  High blood pressure is more likely to develop in: ? People who have blood pressure in the high end of the normal range (130-139/85-89 mm Hg). ? People who are overweight or obese. ? People who are African American.  If you are 48-73 years of age, have your blood pressure checked every 3-5 years. If you are 45 years of age or older, have your blood pressure checked every year. You should have your blood pressure measured twice-once when you are at a hospital or clinic, and once when you are not at a hospital or clinic. Record the average of the two measurements. To check your blood pressure when you are not at a hospital or clinic, you can use: ? An automated blood pressure machine at a pharmacy. ? A home blood pressure monitor.  If you are between 11 years and 45 years old, ask your health care provider if you should take aspirin to prevent strokes.  Have regular diabetes screenings. This involves taking a blood sample to check your  fasting blood sugar level. ? If you are at a normal weight and have a low risk for diabetes, have this test once every three years after 67 years of age. ? If you are overweight and have a high risk for diabetes, consider being tested at a younger age or more often. Preventing infection Hepatitis B  If you have a higher risk for hepatitis B, you should be screened for this virus. You are considered at high risk for hepatitis B if: ? You were born in a country where hepatitis B is common. Ask your health care provider which countries are considered high risk. ? Your parents were born in a high-risk country, and you have not been immunized against hepatitis B (hepatitis B vaccine). ? You have HIV or AIDS. ? You use needles to inject street drugs. ? You live with someone who has hepatitis B. ? You have had sex with someone who has hepatitis B. ? You get hemodialysis treatment. ? You take certain medicines for conditions, including cancer, organ transplantation, and autoimmune conditions.  Hepatitis C  Blood testing is recommended for: ? Everyone born from [birth year] through 1965. ? Anyone with known risk factors for hepatitis C.  Sexually transmitted infections (STIs)  You should be screened for sexually transmitted infections (STIs) including gonorrhea and chlamydia if: ? You are sexually active and are younger than 67 years of age. ? You are older than 67 years of age and your health care provider tells you that you are at risk for this type of infection. ? Your sexual activity has changed since you were last screened and you are at an increased risk for chlamydia or gonorrhea. Ask your health care provider if you are at risk.  If you do not have HIV, but are at risk, it may be recommended that you take a prescription medicine daily to prevent HIV infection. This is called pre-exposure prophylaxis (PrEP). You are considered at risk if: ? You are sexually active and do not regularly use condoms  or know the HIV status of your partner(s). ? You take drugs by injection. ? You are sexually active with a partner who has HIV.  Talk with your health care provider about whether you are at high risk of being infected with HIV. If you choose to begin PrEP, you should first be tested for HIV. You should then be tested every 3 months for as long as you are taking PrEP. Pregnancy  If you  are premenopausal and you may become pregnant, ask your health care provider about preconception counseling.  If you may become pregnant, take 400 to 800 micrograms (mcg) of folic acid every day.  If you want to prevent pregnancy, talk to your health care provider about birth control (contraception). Osteoporosis and menopause  Osteoporosis is a disease in which the bones lose minerals and strength with aging. This can result in serious bone fractures. Your risk for osteoporosis can be identified using a bone density scan.  If you are 76 years of age or older, or if you are at risk for osteoporosis and fractures, ask your health care provider if you should be screened.  Ask your health care provider whether you should take a calcium or vitamin D supplement to lower your risk for osteoporosis.  Menopause may have certain physical symptoms and risks.  Hormone replacement therapy may reduce some of these symptoms and risks. Talk to your health care provider about whether hormone replacement therapy is right for you. Follow these instructions at home:  Schedule regular health, dental, and eye exams.  Stay current with your immunizations.  Do not use any tobacco products including cigarettes, chewing tobacco, or electronic cigarettes.  If you are pregnant, do not drink alcohol.  If you are breastfeeding, limit how much and how often you drink alcohol.  Limit alcohol intake to no more than 1 drink per day for nonpregnant women. One drink equals 12 ounces of beer, 5 ounces of wine, or 1 ounces of hard  liquor.  Do not use street drugs.  Do not share needles.  Ask your health care provider for help if you need support or information about quitting drugs.  Tell your health care provider if you often feel depressed.  Tell your health care provider if you have ever been abused or do not feel safe at home. This information is not intended to replace advice given to you by your health care provider. Make sure you discuss any questions you have with your health care provider. Document Released: 05/03/2011 Document Revised: 03/25/2016 Document Reviewed: 07/22/2015 Elsevier Interactive Patient Education  [employer].

## 2018-08-21 NOTE — Progress Notes (Signed)
[name] is a 67 y.o. female with the following history as recorded in [employer]:  Patient Active Problem List   Diagnosis Date Noted  . Medicare annual wellness visit, initial 06/14/2016  . Routine general medical examination at a health care facility 06/14/2016  . Caregiver with fatigue 03/30/2016  . 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 Anxiety state 04/09/2015  . Essential tremor   . Osteoporosis 09/22/2009  . FIBROIDS, UTERUS 09/10/2009  . ANEMIA-NOS 09/10/2009  . DEPRESSION 09/10/2009  . ALLERGIC RHINITIS 09/10/2009  . Hyperlipidemia 10/16/2008    Current Outpatient Medications  Medication Sig Dispense Refill  . ALPRAZolam (XANAX) 0.25 MG tablet Take 1 tablet (0.25 mg total) by mouth daily as needed for anxiety or sleep. 30 tablet 0  . Cholecalciferol (VITAMIN D3) 1000 UNITS CAPS Take by mouth daily.      . mirtazapine (REMERON) 15 MG tablet TAKE 1 TABLET(15 MG) BY MOUTH AT BEDTIME 90 tablet 0  . polyethylene glycol powder (MIRALAX) powder Take 17 g by mouth 3 (three) times a week.     . primidone (MYSOLINE) 50 MG tablet TAKE 3 TABLETS BY MOUTH TWICE DAILY. 540 tablet 1  . propranolol (INDERAL) 10 MG tablet Take 1 tablet (10 mg total) by mouth 2 (two) times daily. 180 tablet 1  . Risedronate Sodium 35 MG TBEC   1   No current facility-administered medications for this visit.     Allergies: Codeine; Hydrocodone-acetaminophen; and Morphine and related  Past Medical History:  Diagnosis Date  . Actinic keratosis   . ALLERGIC RHINITIS   . ANEMIA-NOS   . Anxiety   . Cataract 2014   left cataract extraction with lens implant  . DEPRESSION   . FIBROIDS, UTERUS   . HYPERLIPIDEMIA   . Macular degeneration of both eyes 2012   mild  . 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 Neuromuscular disorder ([place]) 2014   hand tremors  .  OSTEOPOROSIS 2002   on bisphos  . Sarcoidosis 2009    Past Surgical History:  Procedure Laterality Date  . BREAST SURGERY     biospy  . CATARACT EXTRACTION Left 07/2013   dr Herbert Deaner  . Child birth  48  . COLONOSCOPY  2011  . STOMACH SURGERY  11/2008   2-3cm GIST excision  . TONSILLECTOMY  1957  . TUBAL LIGATION    . UPPER GASTROINTESTINAL ENDOSCOPY      Family History  Problem Relation Age of Onset  . Allergies Mother   . Colon cancer Mother   . Tremor Mother   . Hodgkin's lymphoma Father 30  . Allergies Daughter   . Breast cancer Maternal Aunt   . Prostate cancer Maternal Uncle   . Esophageal cancer Maternal Grandfather   . Stomach cancer Neg Hx     Social History   Tobacco Use  . Smoking status: Never Smoker  . Smokeless tobacco: Never Used  Substance Use Topics  . Alcohol use: Yes    Alcohol/week: 3.0 - 4.0 standard drinks    Types: 3 - 4 Glasses of wine per week     Subjective:  [name] is here today for CPE.   CPE-  Last dental exam: 2019 Last vision exam: 2018 Colonoscopy: up to date Mammogram: up to date per patient, by GYN Lipids: lipid panel ordered today, hx HLD, Has been on statins in the  past and prefers not to resume Vaccinations: PNA vacc today DEXA- up to date, last done by GYN 2018 per pt Diet and exercise: tries to eat mostly plant based diet, plays with grandson , hand weights  No LMP recorded. Patient is postmenopausal.  Review of Systems  Constitutional: Negative for chills and fever.  HENT: Negative for hearing loss and sore throat.   Eyes: Negative for blurred vision and double vision.  Respiratory: Negative for cough and shortness of breath.   Cardiovascular: Negative for chest pain and palpitations.  Gastrointestinal: Negative for abdominal pain, constipation and diarrhea.  Genitourinary: Negative for dysuria and hematuria.  Musculoskeletal: Negative for falls.  Skin: Negative for rash.  Neurological: Negative for speech  change, loss of consciousness and headaches.  Endo/Heme/Allergies: Does not bruise/bleed easily.  Psychiatric/Behavioral: Negative for depression. The patient is not nervous/anxious.    Objective:  Vitals:   08/21/18 1056  BP: 120/70  Pulse: 73  SpO2: 98%  Weight: 125 lb (56.7 kg)  Height: 5\' 5"  (1.651 m)    General: Well developed, well nourished, in no acute distress  Skin : Warm and dry.  Head: Normocephalic and atraumatic  Eyes: Sclera and conjunctiva clear; pupils round and reactive to light; extraocular movements intact  Ears: External normal; canals clear, scant cerumen bilaterally; tympanic membranes normal  Oropharynx: Pink, supple. No suspicious lesions  Neck: Supple without thyromegaly, adenopathy  Lungs: Respirations unlabored; clear to auscultation bilaterally without wheeze, rales, rhonchi  CVS exam: normal rate, regular rhythm, normal S1, S2, no murmurs, rubs, clicks or gallops.  Abdomen: Soft; nontender; nondistended; normoactive bowel sounds; no masses or hepatosplenomegaly  Musculoskeletal: No deformities; no active joint inflammation  Extremities: No edema, cyanosis Vessels: Symmetric bilaterally  Neurologic: Alert and oriented; speech intact; face symmetrical; moves all extremities well; CNII-XII intact without focal deficit, tremor noted   Assessment:  1. Routine general medical examination at a health care facility   2. Mixed hyperlipidemia   3. Anxiety state     Plan:   Return in about 1 year (around 08/22/2019) for CPE.  Orders Placed This Encounter  Procedures  . Lipid panel    Standing Status:   Future    Standing Expiration Date:   08/22/2019  . CBC    Standing Status:   Future    Standing Expiration Date:   08/22/2019  . Comprehensive metabolic panel    Standing Status:   Future    Standing Expiration Date:   08/22/2019    Requested Prescriptions    No prescriptions requested or ordered in this encounter

## 2018-08-22 ENCOUNTER — Encounter: Payer: Self-pay | Admitting: Nurse Practitioner

## 2018-08-22 ENCOUNTER — Other Ambulatory Visit (INDEPENDENT_AMBULATORY_CARE_PROVIDER_SITE_OTHER): Payer: Medicare Other

## 2018-08-22 DIAGNOSIS — E782 Mixed hyperlipidemia: Secondary | ICD-10-CM | POA: Diagnosis not present

## 2018-08-22 DIAGNOSIS — Z0000 Encounter for general adult medical examination without abnormal findings: Secondary | ICD-10-CM

## 2018-08-22 LAB — COMPREHENSIVE METABOLIC PANEL
ALBUMIN: 4 g/dL (ref 3.5–5.2)
ALK PHOS: 46 U/L (ref 39–117)
ALT: 21 U/L (ref 0–35)
AST: 18 U/L (ref 0–37)
BUN: 14 mg/dL (ref 6–23)
CALCIUM: 9.3 mg/dL (ref 8.4–10.5)
CO2: 29 mEq/L (ref 19–32)
Chloride: 104 mEq/L (ref 96–112)
Creatinine, Ser: 0.81 mg/dL (ref 0.40–1.20)
GFR: 74.91 mL/min (ref >60.00)
GLUCOSE: 89 mg/dL (ref 70–99)
POTASSIUM: 3.9 meq/L (ref 3.5–5.1)
Sodium: 141 mEq/L (ref 135–145)
TOTAL PROTEIN: 6.9 g/dL (ref 6.0–8.3)
Total Bilirubin: 0.3 mg/dL (ref 0.2–1.2)

## 2018-08-22 LAB — LDL CHOLESTEROL, DIRECT: LDL DIRECT: 147 mg/dL

## 2018-08-22 LAB — LIPID PANEL
Cholesterol: 220 mg/dL — ABNORMAL HIGH (ref 0–200)
HDL: 37.9 mg/dL — AB (ref >39.00)
NONHDL: 181.69
Total CHOL/HDL Ratio: 6
Triglycerides: 213 mg/dL — ABNORMAL HIGH (ref 0.0–149.0)
VLDL: 42.6 mg/dL — AB (ref 0.0–40.0)

## 2018-08-22 LAB — CBC
HCT: 44 % (ref 36.0–46.0)
HEMOGLOBIN: 14.9 g/dL (ref 12.0–15.0)
MCHC: 33.9 g/dL (ref 30.0–36.0)
MCV: 93.6 fl (ref 78.0–100.0)
PLATELETS: 226 10*3/uL (ref 150.0–400.0)
RBC: 4.7 Mil/uL (ref 3.87–5.11)
RDW: 14.1 % (ref 11.5–15.5)
WBC: 6.4 10*3/uL (ref 4.0–10.5)

## 2018-09-04 ENCOUNTER — Encounter: Payer: Self-pay | Admitting: *Deleted

## 2018-10-02 NOTE — Progress Notes (Signed)
Subjective:   [name] was seen in f/u in the movement disorder clinic at the request of Lance Sell, NP.  The f/u is for essential tremor.  The patient is a 67 y.o. right handed female with a history of tremor.Onset of symptoms was in high school. Tremor primarily involves the bilateral hand.  The tremor is symmetric.   She notices the right hand more than the L.   Symptoms are currently of moderate severity. Tremor exacerbated by stress.  She is not sure if caffeine will affect tremor (she drinks 2 cups of tea per day).  She rarely drinks soda and no coffee in the home.   She has not noted improvement with alcohol.  She has trouble with soup.  She will try to use 2 hands to eat or drink her soup.  She has trouble pouring liquids.    She never notices the tremor at rest.  She has no voice tremor.  She has good balance.  Her mother has tremor as well.  She may have noted a head tremor a few times.  The patient was recently started on 08/09/2012 on Propranolol LA 60 mg daily.  Last visit, we tried to increase the dose to twice a day dosing but she had significant fatigue with that.  She is not taking it twice a day every other day and on the opposite day she is just taking it once a day.  She thinks that it has helped some but not significantly.  She states that she quit working out and has really had poor motivation to get out of the house.  She is not sure that it is from the medication.  She states she has not been eating right lately either.  She has gained a few pounds.  06/17/16 update:  The patient is seen back in the neurology clinic today, but I have not seen her in nearly 4 years.  She reports worsening of tremor over that time.  Tremor is located in both hands equally and is most evident when she uses the hand.  She notices it especially when writing a check in public.  If using plastic utensils or having to eat soup, it is not good.  Her mother has tremor but is 7 y/o and pts  tremor is worse than mothers.  She is R hand dominant.  She has tried propranolol, but is unable to tolerate higher dosages, and lower dosages have not been efficacious.  When I saw her nearly 4 years ago, she had just tried primidone but had nausea with it and "just felt bad" and couldn't continue it more than a few days before she d/c it.  Reports today that she thinks that she would like to try the propranolol again but she didn't remember the SE that she had in the past until I read her old notes to her.  09/21/16 update:  The patient follows up today.  We started her on primidone last visit.  We started it very slowly and she is only on 50 mg at night.  No SE this time.  Tremor may be a little better but not enough better.    01/19/17 update:  Patient follows up today.  She is now on primidone 50 mg twice per day.  She states that it is helping some but not enough and she would like to increase further.  She is having no SE.    04/27/17 update:  Patient seen today in follow-up.  I increased her primidone last visit so that she was taking 50 mg in the morning and 100 mg at night.  She states that "it is probably a little better."  She denies any side effects with the medication.  No new medical problems or medications since last visit.  08/30/17 update: Patient is seen today in follow-up for essential tremor.  She is now on primidone, 100 mg twice per day.  She states that she has no side effects with the medication.  She states that she has good and bad days.  She recently threw her drink (peach bellini) at her sister when trying to toast her.  01/10/18 update: Patient is seen today in follow-up for essential tremor.  She is on primidone, 100 mg twice per day.  We added gabapentin last visit, 300 mg twice per day.  She reports that she didn't think that gabapentin helps.  Handwriting and eating is still troublesome.  05/17/18 update: Patient is seen today in follow-up for essential tremor.  Primidone  was increased last visit so that she is on 150 mg twice per day.  Gabapentin was discontinued.  She reports that she is doing about the same. Still with bothersome tremor.  Records have been reviewed since last visit.  She saw her nurse practitioner a few days after she saw me last visit.  10/03/18 update: Patient seen today in follow-up for essential tremor.  She is on primidone, 150 mg twice per day.  We started propranolol 10 mg bid and she states that it really helped the tremor.  She initially thought it made her sleepy, but now states that she does not think it did.  She states that she thinks that overall she is a fairly low "energy person."  She is happy with her tremor control.  Current/Previously tried tremor medications: propranolol LA and primidone (SE at low dosages for both); gabapentin  Current medications that may exacerbate tremor:  none   Allergies  Allergen Reactions  . Codeine Nausea Only    REACTION: nausea  . Hydrocodone-Acetaminophen Nausea Only  . Morphine And Related Nausea Only    Intense nausea  . Primidone     Current Outpatient Medications on File Prior to Visit  Medication Sig Dispense Refill  . alendronate (FOSAMAX) 70 MG tablet     . ALPRAZolam (XANAX) 0.25 MG tablet Take 1 tablet (0.25 mg total) by mouth daily as needed for anxiety or sleep. 30 tablet 0  . Cholecalciferol (VITAMIN D3) 1000 UNITS CAPS Take by mouth daily.      . mirtazapine (REMERON) 15 MG tablet TAKE 1 TABLET(15 MG) BY MOUTH AT BEDTIME 90 tablet 0  . polyethylene glycol powder (MIRALAX) powder Take 17 g by mouth 3 (three) times a week.     . primidone (MYSOLINE) 50 MG tablet TAKE 3 TABLETS BY MOUTH TWICE DAILY. 540 tablet 1  . Risedronate Sodium 35 MG TBEC   1  . simvastatin (ZOCOR) 20 MG tablet      No current facility-administered medications on file prior to visit.     Past Medical History:  Diagnosis Date  . Actinic keratosis   . ALLERGIC RHINITIS   . ANEMIA-NOS   . Anxiety    . Cataract 2014   left cataract extraction with lens implant  . DEPRESSION   . FIBROIDS, UTERUS   . HYPERLIPIDEMIA   . Macular degeneration of both eyes 2012   mild  . 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 Neuromuscular disorder ([place]) 2014   hand tremors  . OSTEOPOROSIS 2002   on bisphos  . Sarcoidosis 2009    Past Surgical History:  Procedure Laterality Date  . BREAST SURGERY     biospy  . CATARACT EXTRACTION Left 07/2013   dr Herbert Deaner  . Child birth  6  . COLONOSCOPY  2011  . STOMACH SURGERY  11/2008   2-3cm GIST excision  . TONSILLECTOMY  1957  . TUBAL LIGATION    . UPPER GASTROINTESTINAL ENDOSCOPY      Social History   Socioeconomic History  . Marital status: Single    Spouse name: Not on file  . Number of children: Not on file  . Years of education: Not on file  . Highest education level: Not on file  Occupational History  . 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 Financial resource strain: Not hard at all  . Food insecurity:    Worry: Never true    Inability: Never true  . Transportation needs:    Medical: No    Non-medical: No  Tobacco Use  . Smoking status: Never Smoker  . Smokeless tobacco: Never Used  Substance and Sexual Activity  . Alcohol use: Yes    Alcohol/week: 3.0 - 4.0 standard drinks    Types: 3 - 4 Glasses of wine per week  . Drug use: No  . Sexual activity: Not Currently  Lifestyle  . Physical activity:    Days per week: 0 days    Minutes per session: 0 min  . Stress: Not at all  Relationships  . Social connections:    Talks on phone: More than three times a week    Gets together: More than three times a week    Attends religious service: 1 to 4 times per year    Active member of club or organization: Yes    Attends meetings of clubs or organizations: More than 4  times per year    Relationship status: Not on file  . Intimate partner violence:    Fear of current or ex partner: Not on file    Emotionally abused: Not on file    Physically abused: Not on file    Forced sexual activity: Not on file  Other Topics Concern  . Not on file  Social History Narrative   Divorced, lives alone. Pt has 1 dtr. 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 Mother  Alive       colorectal CA  . Father  Deceased       hodgkins disease  . Sister  Alive       healthy  . Daughter  (Not Specified)  . Mat Aunt  (Not Specified)  . Mat Uncle  (Not Specified)  . MGF  (Not Specified)  . Neg Hx  (Not Specified)    Review of Systems   A complete 10 system ROS was obtained and was negative apart from what is mentioned.   Objective:   VITALS:   Vitals:   10/03/18 1301  BP: 110/70  Pulse: 66  SpO2: 96%  Weight: 126 lb 6 oz (57.3 kg)  Height: 5\' 5"  (1.651 m)   Gen:  Appears stated age and in NAD.  HEENT:  Normocephalic, atraumatic. The mucous membranes are moist. The superficial temporal arteries are without ropiness or tenderness. Cardiovascular: Regular rate and rhythm. Lungs: Clear to auscultation bilaterally. Neck: There are no carotid bruits noted bilaterally.  NEUROLOGICAL:  Orientation:  The patient is alert and oriented x 3.   Cranial nerves: There is good facial symmetry.  Speech is fluent and clear. Soft palate rises symmetrically and there is no tongue deviation. Hearing is intact to conversational tone. Tone: Tone is good throughout. Sensation: Sensation is intact to light touch throughout. Coordination:  The patient has no dysdiadichokinesia or dysmetria. Motor: Strength is 5/5 in the bilateral upper and lower extremities.  Shoulder shrug is equal bilaterally.  There is no pronator drift.  There are no fasciculations noted. Gait and Station: The patient is  able to ambulate without difficulty.   MOVEMENT EXAM: Tremor:  There is no rest tremor.  She has mild to moderate tremor on the right hand with intention.  She has just mild tremor on the left.  She actually draws Archimedes spirals fairly well.    LABS:  Lab Results  Component Value Date   TSH 2.67 10/28/2015     Chemistry      Component Value Date/Time   NA 141 08/22/2018 0849   K 3.9 08/22/2018 0849   CL 104 08/22/2018 0849   CO2 29 08/22/2018 0849   BUN 14 08/22/2018 0849   CREATININE 0.81 08/22/2018 0849      Component Value Date/Time   CALCIUM 9.3 08/22/2018 0849   ALKPHOS 46 08/22/2018 0849   AST 18 08/22/2018 0849   ALT 21 08/22/2018 0849   BILITOT 0.3 08/22/2018 0849       Assessment/Plan   1.  Essential tremor.   This is evidenced by longstanding history and most evident with activation and some with intention.  There is also a fam hx of tremor in her mother.   She has a mild resting component on the R but this can be seen in longstanding ET.  She has no bradykinesia.  She is doing better with a combination of low-dose propranolol and primidone.  She is only on propranolol, 10 mg twice per day.  She did not tolerate high-dose propranolol, but seems to be doing well with this low dose.  We decided not to change anything today.  We may be able to slightly increase the dose, but I think she is doing okay right now and she agrees.  She does not want DBS surgery.  -continue primidone  50 mg - 3 po bid.  Risks, benefits, side effects and alternative therapies were discussed.  The opportunity to ask questions was given and they were answered to the best of my ability.  The patient expressed understanding and willingness to follow the outlined treatment protocols.  2.  Follow-up in 6 to 8 months.

## 2018-10-03 ENCOUNTER — Ambulatory Visit: Payer: Medicare Other | Admitting: Neurology

## 2018-10-03 ENCOUNTER — Encounter: Payer: Self-pay | Admitting: Neurology

## 2018-10-03 VITALS — BP 110/70 | HR 66 | Ht 65.0 in | Wt 126.4 lb

## 2018-10-03 DIAGNOSIS — G250 Essential tremor: Secondary | ICD-10-CM

## 2018-10-03 MED ORDER — PROPRANOLOL HCL 10 MG PO TABS: 10.0000 mg | ORAL_TABLET | Freq: Two times a day (BID) | ORAL | 1 refills | Status: DC

## 2018-10-14 ENCOUNTER — Other Ambulatory Visit: Payer: Self-pay | Admitting: Nurse Practitioner

## 2018-10-14 DIAGNOSIS — R5383 Other fatigue: Secondary | ICD-10-CM

## 2018-10-14 DIAGNOSIS — F411 Generalized anxiety disorder: Secondary | ICD-10-CM

## 2018-10-16 NOTE — Telephone Encounter (Signed)
[place] Controlled Database Checked Last filled: ??  LOV w/you: 08/21/18  Next appt w/you: None

## 2019-01-12 ENCOUNTER — Other Ambulatory Visit: Payer: Self-pay | Admitting: Nurse Practitioner

## 2019-01-12 ENCOUNTER — Other Ambulatory Visit: Payer: Self-pay | Admitting: Neurology

## 2019-01-12 DIAGNOSIS — R5383 Other fatigue: Secondary | ICD-10-CM

## 2019-01-12 DIAGNOSIS — F411 Generalized anxiety disorder: Secondary | ICD-10-CM

## 2019-04-09 ENCOUNTER — Other Ambulatory Visit: Payer: Self-pay | Admitting: *Deleted

## 2019-04-09 DIAGNOSIS — F411 Generalized anxiety disorder: Secondary | ICD-10-CM

## 2019-04-09 DIAGNOSIS — R5383 Other fatigue: Secondary | ICD-10-CM

## 2019-04-09 MED ORDER — MIRTAZAPINE 15 MG PO TABS: ORAL_TABLET | ORAL | 0 refills | Status: DC

## 2019-04-30 ENCOUNTER — Other Ambulatory Visit: Payer: Self-pay | Admitting: Family

## 2019-04-30 MED ORDER — ALPRAZOLAM 0.25 MG PO TABS: 0.2500 mg | ORAL_TABLET | Freq: Every day | ORAL | 0 refills | Status: DC | PRN

## 2019-05-07 ENCOUNTER — Other Ambulatory Visit: Payer: Self-pay | Admitting: Neurology

## 2019-05-07 NOTE — Progress Notes (Signed)
Virtual Visit via Video Note The purpose of this virtual visit is to provide medical care while limiting exposure to the novel coronavirus.    Consent was obtained for video visit:  Yes.   Answered questions that patient had about telehealth interaction:  Yes.   I discussed the limitations, risks, security and privacy concerns of performing an evaluation and management service by telemedicine. I also discussed with the patient that there may be a patient responsible charge related to this service. The patient expressed understanding and agreed to proceed.  Pt location: Home Physician Location: office Name of referring provider:  Lance Sell, NP I connected with Riesa Pope at patients initiation/request on 05/10/2019 at  1:00 PM EDT by video enabled telemedicine application and verified that I am speaking with the correct person using two identifiers. Pt MRN:  [id number] Pt DOB:  1951-07-04 Video Participants:  Riesa Pope;     History of Present Illness:  Patient seen today in follow-up for essential tremor.  Patient is on primidone, 150 mg twice per day and propranolol, 10 mg twice per day.  Tremor has been stable.  She just finished writing a letter and was able to do that.  Pt denies falls.  Pt denies lightheadedness, near syncope.  No hallucinations.  Mood has been good.   Current Outpatient Medications on File Prior to Visit  Medication Sig Dispense Refill  . ALPRAZolam (XANAX) 0.25 MG tablet Take 1 tablet (0.25 mg total) by mouth daily as needed for anxiety or sleep. 30 tablet 0  . Cholecalciferol (VITAMIN D3) 1000 UNITS CAPS Take by mouth daily.      . mirtazapine (REMERON) 15 MG tablet TAKE 1 TABLET(15 MG) BY MOUTH AT BEDTIME. NEED TO ESTABLISH WITH NEW PROVIDER. 90 tablet 0  . polyethylene glycol powder (MIRALAX) powder Take 17 g by mouth 3 (three) times a week.     . primidone (MYSOLINE) 50 MG tablet TAKE 3 TABLETS BY MOUTH TWICE DAILY. 540 tablet 1  .  propranolol (INDERAL) 10 MG tablet TAKE 1 TABLET(10 MG) BY MOUTH TWICE DAILY 180 tablet 1  . Risedronate Sodium 35 MG TBEC   1   No current facility-administered medications on file prior to visit.      Observations/Objective:   Vitals:   05/10/19 1115  Weight: 126 lb (57.2 kg)  Height: 5' 4.5" (1.638 m)  unable to get BP GEN:  The patient appears stated age and is in NAD.  Neurological examination:  Orientation: The patient is alert and oriented x3. Cranial nerves: There is good facial symmetry. There is no facial hypomimia.  The speech is fluent and clear. Soft palate rises symmetrically and there is no tongue deviation. Hearing is intact to conversational tone. Motor: Strength is at least antigravity x 4.   Shoulder shrug is equal and symmetric.  There is no pronator drift.  Movement examination: Tone: unable Abnormal movements: there is mod RUE postural tremor and mild on the L Coordination:  There is no decremation with RAM's, with any form of RAMS, including alternating supination and pronation of the forearm, hand opening and closing, finger taps. Gait and Station: The patient's stride length is good and she is stable.      Chemistry      Component Value Date/Time   NA 141 08/22/2018 0849   K 3.9 08/22/2018 0849   CL 104 08/22/2018 0849   CO2 29 08/22/2018 0849   BUN 14 08/22/2018 0849   CREATININE 0.81  08/22/2018 0849      Component Value Date/Time   CALCIUM 9.3 08/22/2018 0849   ALKPHOS 46 08/22/2018 0849   AST 18 08/22/2018 0849   ALT 21 08/22/2018 0849   BILITOT 0.3 08/22/2018 0849       Assessment and Plan:   1.  Essential tremor  -Patient with family history of essential tremor in mother.  She has a mild resting component on the right, but has longstanding essential tremor and this can happen.  -Continue primidone, 150 mg twice per day  -Continue propranolol, 10 mg twice per day.  Didn't want to increase that when she doesn't have ability to get me a  BP/vitals.    -pt reported no PCP.  She is to call her PCP office and see if she is reassigned to a new provider, as her nurse practitioner left.  Follow Up Instructions:    -I discussed the assessment and treatment plan with the patient. The patient was provided an opportunity to ask questions and all were answered. The patient agreed with the plan and demonstrated an understanding of the instructions.   The patient was advised to call back or seek an in-person evaluation if the symptoms worsen or if the condition fails to improve as anticipated.    Total Time spent in visit with the patient was:  15 min (10 via video, 5 via phone), of which more than 50% of the time was spent in counseling on meds/safety of them.   Pt understands and agrees with the plan of care outlined.     Alonza Bogus, DO

## 2019-05-07 NOTE — Telephone Encounter (Signed)
Requested Prescriptions   Pending Prescriptions Disp Refills  . propranolol (INDERAL) 10 MG tablet [Pharmacy Med Name: PROPRANOLOL 10MG  TABLETS] 180 tablet 1    Sig: TAKE 1 TABLET(10 MG) BY MOUTH TWICE DAILY   Rx last filled:10/03/2018 #180 1 refills  Pt last seen:10/03/2018  Follow up appt scheduled:05/10/19   Approved sent

## 2019-05-10 ENCOUNTER — Encounter: Payer: Self-pay | Admitting: Neurology

## 2019-05-10 ENCOUNTER — Telehealth (INDEPENDENT_AMBULATORY_CARE_PROVIDER_SITE_OTHER): Payer: Medicare Other | Admitting: Neurology

## 2019-05-10 ENCOUNTER — Other Ambulatory Visit: Payer: Self-pay

## 2019-05-10 VITALS — Ht 64.5 in | Wt 126.0 lb

## 2019-05-10 DIAGNOSIS — G250 Essential tremor: Secondary | ICD-10-CM

## 2019-06-27 ENCOUNTER — Other Ambulatory Visit: Payer: Self-pay | Admitting: Internal Medicine

## 2019-06-27 DIAGNOSIS — F411 Generalized anxiety disorder: Secondary | ICD-10-CM

## 2019-06-27 DIAGNOSIS — R5383 Other fatigue: Secondary | ICD-10-CM

## 2019-07-25 ENCOUNTER — Other Ambulatory Visit: Payer: Self-pay

## 2019-07-25 ENCOUNTER — Other Ambulatory Visit: Payer: Self-pay | Admitting: Internal Medicine

## 2019-07-25 DIAGNOSIS — F411 Generalized anxiety disorder: Secondary | ICD-10-CM

## 2019-07-25 DIAGNOSIS — R5383 Other fatigue: Secondary | ICD-10-CM

## 2019-07-25 MED ORDER — PRIMIDONE 50 MG PO TABS: 150.0000 mg | ORAL_TABLET | Freq: Two times a day (BID) | ORAL | 1 refills | Status: DC

## 2019-07-25 NOTE — Telephone Encounter (Signed)
Requested Prescriptions   Pending Prescriptions Disp Refills  . primidone (MYSOLINE) 50 MG tablet 540 tablet 1    Sig: Take 3 tablets (150 mg total) by mouth 2 (two) times daily.   Rx last filled: 01/12/19 #540 1 refills  Pt last seen: 05/10/19  Follow up appt scheduled: 11/13/2019

## 2019-08-21 ENCOUNTER — Ambulatory Visit (INDEPENDENT_AMBULATORY_CARE_PROVIDER_SITE_OTHER): Payer: Medicare Other | Admitting: Family

## 2019-08-21 ENCOUNTER — Other Ambulatory Visit: Payer: Self-pay

## 2019-08-21 ENCOUNTER — Encounter: Payer: Self-pay | Admitting: Family

## 2019-08-21 VITALS — BP 106/74 | HR 70 | Temp 98.1°F | Ht 64.5 in | Wt 131.1 lb

## 2019-08-21 DIAGNOSIS — C49A0 Gastrointestinal stromal tumor, unspecified site: Secondary | ICD-10-CM

## 2019-08-21 DIAGNOSIS — R5383 Other fatigue: Secondary | ICD-10-CM | POA: Diagnosis not present

## 2019-08-21 DIAGNOSIS — Z0000 Encounter for general adult medical examination without abnormal findings: Secondary | ICD-10-CM

## 2019-08-21 DIAGNOSIS — F411 Generalized anxiety disorder: Secondary | ICD-10-CM

## 2019-08-21 DIAGNOSIS — E785 Hyperlipidemia, unspecified: Secondary | ICD-10-CM

## 2019-08-21 MED ORDER — MIRTAZAPINE 15 MG PO TABS: 15.0000 mg | ORAL_TABLET | Freq: Every day | ORAL | 3 refills | Status: DC

## 2019-08-21 NOTE — Progress Notes (Signed)
[name] is a 68 y.o. female with the following history as recorded in EpicCare:  Patient Active Problem List   Diagnosis Date Noted  . Medicare annual wellness visit, initial 06/14/2016  . Routine general medical examination at a health care facility 06/14/2016  . Caregiver with fatigue 03/30/2016  . Malignant GIST (gastrointestinal stromal tumor) ([place])   . Anxiety state 04/09/2015  . Essential tremor   . Osteoporosis 09/22/2009  . FIBROIDS, UTERUS 09/10/2009  . ANEMIA-NOS 09/10/2009  . DEPRESSION 09/10/2009  . ALLERGIC RHINITIS 09/10/2009  . Hyperlipidemia 10/16/2008    Current Outpatient Medications  Medication Sig Dispense Refill  . ALPRAZolam (XANAX) 0.25 MG tablet Take 1 tablet (0.25 mg total) by mouth daily as needed for anxiety or sleep. 30 tablet 0  . Cholecalciferol (VITAMIN D3) 1000 UNITS CAPS Take by mouth daily.      . mirtazapine (REMERON) 15 MG tablet Take 1 tablet (15 mg total) by mouth at bedtime. 90 tablet 3  . polyethylene glycol powder (MIRALAX) powder Take 17 g by mouth 3 (three) times a week.     . primidone (MYSOLINE) 50 MG tablet Take 3 tablets (150 mg total) by mouth 2 (two) times daily. 540 tablet 1  . propranolol (INDERAL) 10 MG tablet TAKE 1 TABLET(10 MG) BY MOUTH TWICE DAILY 180 tablet 1  . Risedronate Sodium 35 MG TBEC   1   No current facility-administered medications for this visit.     Allergies: Codeine, Hydrocodone-acetaminophen, Morphine and related, and Primidone  Past Medical History:  Diagnosis Date  . Actinic keratosis   . ALLERGIC RHINITIS   . ANEMIA-NOS   . Anxiety   . Cataract 2014   left cataract extraction with lens implant  . DEPRESSION   . FIBROIDS, UTERUS   . HYPERLIPIDEMIA   . Macular degeneration of both eyes 2012   mild  . 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 Neuromuscular disorder ([place]) 2014    hand tremors  . OSTEOPOROSIS 2002   on bisphos  . Sarcoidosis 2009    Past Surgical History:  Procedure Laterality Date  . BREAST SURGERY     biospy  . CATARACT EXTRACTION Left 07/2013   dr Herbert Deaner  . Child birth  89  . COLONOSCOPY  2011  . STOMACH SURGERY  11/2008   2-3cm GIST excision  . TONSILLECTOMY  1957  . TUBAL LIGATION    . UPPER GASTROINTESTINAL ENDOSCOPY      Family History  Problem Relation Age of Onset  . Allergies Mother   . Colon cancer Mother   . Tremor Mother   . Hodgkin's lymphoma Father 84  . Dermatomyositis Sister   . Allergies Daughter   . Breast cancer Maternal Aunt   . Prostate cancer Maternal Uncle   . Esophageal cancer Maternal Grandfather   . Stomach cancer Neg Hx     Social History   Tobacco Use  . Smoking status: Never Smoker  . Smokeless tobacco: Never Used  Substance Use Topics  . Alcohol use: Yes    Alcohol/week: 3.0 - 4.0 standard drinks    Types: 3 - 4 Glasses of wine per week    Subjective:  Presents for yearly CPE; in baseline state of health today; Up to date on GYN needs- Laurin Coder is managing mammogram, DEXA; Sees neurology every 6 months; Anxiety is stable; Sees eye doctor and dentist  regularly;  Health Maintenance  Topic Date Due  . MAMMOGRAM  04/25/2020  . TETANUS/TDAP  11/07/2023  . COLONOSCOPY  06/11/2026  . INFLUENZA VACCINE  Completed  . DEXA SCAN  Completed  . Hepatitis C Screening  Completed  . PNA vac Low Risk Adult  Completed   Review of Systems  Constitutional: Negative.   HENT: Negative.   Eyes: Negative.   Respiratory: Negative.   Cardiovascular: Negative.   Gastrointestinal: Negative for abdominal pain.  Genitourinary: Negative.   Musculoskeletal: Negative.   Skin: Negative.   Neurological: Positive for tremors.  Endo/Heme/Allergies: Negative.   Psychiatric/Behavioral: The patient has insomnia.      Objective:  Vitals:   08/21/19 1055  BP: 106/74  Pulse: 70  Temp: 98.1 F (36.7 C)   TempSrc: Oral  SpO2: 97%  Weight: 131 lb 1.3 oz (59.5 kg)  Height: 5' 4.5" (1.638 m)    General: Well developed, well nourished, in no acute distress  Skin : Warm and dry.  Head: Normocephalic and atraumatic  Eyes: Sclera and conjunctiva clear; pupils round and reactive to light; extraocular movements intact  Ears: External normal; canals clear; tympanic membranes normal  Oropharynx: Pink, supple. No suspicious lesions  Neck: Supple without thyromegaly, adenopathy  Lungs: Respirations unlabored; clear to auscultation bilaterally without wheeze, rales, rhonchi  CVS exam: normal rate and regular rhythm.  Abdomen: Soft; nontender; nondistended; normoactive bowel sounds; no masses or hepatosplenomegaly  Musculoskeletal: No deformities; no active joint inflammation  Extremities: No edema, cyanosis, clubbing  Vessels: Symmetric bilaterally  Neurologic: Alert and oriented; speech intact; face symmetrical; moves all extremities well; CNII-XII intact without focal deficit  Assessment:  1. PE (physical exam), annual   2. Malignant gastrointestinal stromal tumor, unspecified site [facility]) Chronic  3. Caregiver with fatigue   4. Anxiety state   5. Hyperlipidemia, unspecified hyperlipidemia type     Plan:  Age appropriate preventive healthcare needs addressed; encouraged regular eye doctor and dental exams; encouraged regular exercise; will update labs and refills as needed today; follow-up to be determined; Will be due for GI follow-up in 2022; will contact GYN to get copy of mammogram/ DEXA; she will talk to her pharmacist about Shingrix vaccine.    No follow-ups on file.  Orders Placed This Encounter  Procedures  . CBC w/Diff    Standing Status:   Future    Standing Expiration Date:   08/20/2020  . Comp Met (CMET)    Standing Status:   Future    Standing Expiration Date:   08/20/2020  . Lipid panel    Standing Status:   Future    Standing Expiration Date:   08/20/2020    Requested  Prescriptions   Signed Prescriptions Disp Refills  . mirtazapine (REMERON) 15 MG tablet 90 tablet 3    Sig: Take 1 tablet (15 mg total) by mouth at bedtime.

## 2019-08-21 NOTE — Patient Instructions (Signed)
Please talk to your pharmacist about Shingrix;

## 2019-08-23 ENCOUNTER — Ambulatory Visit: Payer: Medicare Other

## 2019-08-24 ENCOUNTER — Encounter: Payer: Medicare Other | Admitting: Family

## 2019-08-27 ENCOUNTER — Other Ambulatory Visit (INDEPENDENT_AMBULATORY_CARE_PROVIDER_SITE_OTHER): Payer: Medicare Other

## 2019-08-27 DIAGNOSIS — E785 Hyperlipidemia, unspecified: Secondary | ICD-10-CM | POA: Diagnosis not present

## 2019-08-27 LAB — LDL CHOLESTEROL, DIRECT: Direct LDL: 124 mg/dL

## 2019-08-27 LAB — CBC WITH DIFFERENTIAL/PLATELET
Basophils Absolute: 0 10*3/uL (ref 0.0–0.1)
Basophils Relative: 0.2 % (ref 0.0–3.0)
Eosinophils Absolute: 0.7 10*3/uL (ref 0.0–0.7)
Eosinophils Relative: 11.8 % — ABNORMAL HIGH (ref 0.0–5.0)
HCT: 46.6 % — ABNORMAL HIGH (ref 36.0–46.0)
Hemoglobin: 15.8 g/dL — ABNORMAL HIGH (ref 12.0–15.0)
Lymphocytes Relative: 34 % (ref 12.0–46.0)
Lymphs Abs: 2.1 10*3/uL (ref 0.7–4.0)
MCHC: 34 g/dL (ref 30.0–36.0)
MCV: 95.2 fl (ref 78.0–100.0)
Monocytes Absolute: 0.4 10*3/uL (ref 0.1–1.0)
Monocytes Relative: 7 % (ref 3.0–12.0)
Neutro Abs: 2.9 10*3/uL (ref 1.4–7.7)
Neutrophils Relative %: 47 % (ref 43.0–77.0)
Platelets: 260 10*3/uL (ref 150.0–400.0)
RBC: 4.9 Mil/uL (ref 3.87–5.11)
RDW: 13.7 % (ref 11.5–15.5)
WBC: 6.2 10*3/uL (ref 4.0–10.5)

## 2019-08-27 LAB — COMPREHENSIVE METABOLIC PANEL
ALT: 23 U/L (ref 0–35)
AST: 22 U/L (ref 0–37)
Albumin: 4.1 g/dL (ref 3.5–5.2)
Alkaline Phosphatase: 56 U/L (ref 39–117)
BUN: 16 mg/dL (ref 6–23)
CO2: 30 mEq/L (ref 19–32)
Calcium: 9.3 mg/dL (ref 8.4–10.5)
Chloride: 102 mEq/L (ref 96–112)
Creatinine, Ser: 0.71 mg/dL (ref 0.40–1.20)
GFR: 81.8 mL/min (ref >60.00)
Glucose, Bld: 84 mg/dL (ref 70–99)
Potassium: 5.2 mEq/L — ABNORMAL HIGH (ref 3.5–5.1)
Sodium: 138 mEq/L (ref 135–145)
Total Bilirubin: 0.3 mg/dL (ref 0.2–1.2)
Total Protein: 7.2 g/dL (ref 6.0–8.3)

## 2019-08-27 LAB — LIPID PANEL
Cholesterol: 242 mg/dL — ABNORMAL HIGH (ref 0–200)
HDL: 34.3 mg/dL — ABNORMAL LOW (ref >39.00)
NonHDL: 207.96
Total CHOL/HDL Ratio: 7
Triglycerides: 371 mg/dL — ABNORMAL HIGH (ref 0.0–149.0)
VLDL: 74.2 mg/dL — ABNORMAL HIGH (ref 0.0–40.0)

## 2019-10-22 ENCOUNTER — Other Ambulatory Visit: Payer: Self-pay

## 2019-10-22 MED ORDER — PROPRANOLOL HCL 10 MG PO TABS: ORAL_TABLET | ORAL | 1 refills | Status: DC

## 2019-11-13 ENCOUNTER — Ambulatory Visit: Payer: Medicare Other | Admitting: Neurology

## 2019-11-15 ENCOUNTER — Ambulatory Visit: Payer: Medicare PPO | Attending: Internal Medicine

## 2019-11-15 DIAGNOSIS — Z20822 Contact with and (suspected) exposure to covid-19: Secondary | ICD-10-CM

## 2019-11-16 LAB — NOVEL CORONAVIRUS, NAA: SARS-CoV-2, NAA: NOT DETECTED

## 2019-11-21 ENCOUNTER — Ambulatory Visit: Payer: Medicare PPO | Attending: Internal Medicine

## 2019-11-21 DIAGNOSIS — Z23 Encounter for immunization: Secondary | ICD-10-CM

## 2019-11-21 NOTE — Progress Notes (Signed)
   Covid-19 Vaccination Clinic  Name:  [name]    MRN: [id number] DOB: 01-08-1951  11/21/2019  [name] was observed post Covid-19 immunization for 15 minutes without incidence. She was provided with Vaccine Information Sheet and instruction to access the V-Safe system.   [name] was instructed to call 911 with any severe reactions post vaccine: Marland Kitchen Difficulty breathing  . Swelling of your face and throat  . A fast heartbeat  . A bad rash all over your body  . Dizziness and weakness    Immunizations Administered    Name Date Dose VIS Date Route   Pfizer COVID-19 Vaccine 11/21/2019 11:27 AM 0.3 mL 10/12/2019 Intramuscular   Manufacturer: [place]   Lot: [id number]   [place]: [id number]

## 2019-12-12 ENCOUNTER — Ambulatory Visit: Payer: Medicare PPO | Attending: Internal Medicine

## 2019-12-12 DIAGNOSIS — Z23 Encounter for immunization: Secondary | ICD-10-CM | POA: Insufficient documentation

## 2019-12-12 NOTE — Progress Notes (Signed)
   Covid-19 Vaccination Clinic  Name:  [name]    MRN: [id number] DOB: [birth year]  12/12/2019  [name] was observed post Covid-19 immunization for 15 minutes without incidence. She was provided with Vaccine Information Sheet and instruction to access the V-Safe system.   [name] was instructed to call 911 with any severe reactions post vaccine: Marland Kitchen Difficulty breathing  . Swelling of your face and throat  . A fast heartbeat  . A bad rash all over your body  . Dizziness and weakness    Immunizations Administered    Name Date Dose VIS Date Route   Pfizer COVID-19 Vaccine 12/12/2019  3:35 PM 0.3 mL 10/12/2019 Intramuscular   Manufacturer: [place]   Lot: [id number]   [place]: [id number]

## 2019-12-19 ENCOUNTER — Ambulatory Visit: Payer: Medicare PPO

## 2020-01-23 ENCOUNTER — Other Ambulatory Visit: Payer: Self-pay

## 2020-01-23 MED ORDER — PRIMIDONE 50 MG PO TABS: 150.0000 mg | ORAL_TABLET | Freq: Two times a day (BID) | ORAL | 1 refills | Status: DC

## 2020-01-23 NOTE — Telephone Encounter (Signed)
Rx(s) sent to pharmacy electronically.  

## 2020-02-22 NOTE — Progress Notes (Deleted)
Assessment/Plan:    1.  Essential Tremor  ***  Subjective:   [name] was seen today in follow up for essential tremor.  My previous records were reviewed prior to todays visit. Pt denies falls.  Pt denies lightheadedness, near syncope.  No hallucinations.  Mood has been good.  Current prescribed movement disorder medications: ***Primidone, 150 mg twice per day Propranolol, 10 mg twice per day   PREVIOUS MEDICATIONS: {Parkinson's RX:18200}  ALLERGIES:   Allergies  Allergen Reactions  . Codeine Nausea Only    REACTION: nausea  . Hydrocodone-Acetaminophen Nausea Only  . Morphine And Related Nausea Only    Intense nausea  . Primidone     CURRENT MEDICATIONS:  Outpatient Encounter Medications as of 02/26/2020  Medication Sig  . ALPRAZolam (XANAX) 0.25 MG tablet Take 1 tablet (0.25 mg total) by mouth daily as needed for anxiety or sleep.  . Cholecalciferol (VITAMIN D3) 1000 UNITS CAPS Take by mouth daily.    . mirtazapine (REMERON) 15 MG tablet Take 1 tablet (15 mg total) by mouth at bedtime.  . polyethylene glycol powder (MIRALAX) powder Take 17 g by mouth 3 (three) times a week.   . primidone (MYSOLINE) 50 MG tablet Take 3 tablets (150 mg total) by mouth 2 (two) times daily.  . propranolol (INDERAL) 10 MG tablet TAKE 1 TABLET(10 MG) BY MOUTH TWICE DAILY  . Risedronate Sodium 35 MG TBEC    No facility-administered encounter medications on file as of 02/26/2020.     Objective:    PHYSICAL EXAMINATION:    VITALS:  There were no vitals filed for this visit.  GEN:  The patient appears stated age and is in NAD. HEENT:  Normocephalic, atraumatic.  The mucous membranes are moist. The superficial temporal arteries are without ropiness or tenderness. CV:  RRR Lungs:  CTAB Neck/HEME:  There are no carotid bruits bilaterally.  Neurological examination:  Orientation: The patient is alert and oriented x3. Cranial nerves: There is good facial symmetry. The speech  is fluent and clear. Soft palate rises symmetrically and there is no tongue deviation. Hearing is intact to conversational tone. Sensation: Sensation is intact to light touch throughout Motor: Strength is at least antigravity x4.  Movement examination: Tone: There is normal tone in the UE/LE Abnormal movements: *** Coordination:  There is *** decremation with RAM's, *** Gait and Station: The patient has *** difficulty arising out of a deep-seated chair without the use of the hands. The patient's stride length is good I have reviewed and interpreted the following labs independently   Chemistry      Component Value Date/Time   NA 138 08/27/2019 0848   K 5.2 (H) 08/27/2019 0848   CL 102 08/27/2019 0848   CO2 30 08/27/2019 0848   BUN 16 08/27/2019 0848   CREATININE 0.71 08/27/2019 0848      Component Value Date/Time   CALCIUM 9.3 08/27/2019 0848   ALKPHOS 56 08/27/2019 0848   AST 22 08/27/2019 0848   ALT 23 08/27/2019 0848   BILITOT 0.3 08/27/2019 0848     Lab Results  Component Value Date   WBC 6.2 08/27/2019   HGB 15.8 (H) 08/27/2019   HCT 46.6 (H) 08/27/2019   MCV 95.2 08/27/2019   PLT 260.0 08/27/2019      Total time spent on today's visit was ***30 minutes, including both face-to-face time and nonface-to-face time.  Time included that spent on review of records (prior notes available to me/labs/imaging if pertinent),  discussing treatment and goals, answering patient's questions and coordinating care.  Cc:  Marrian Salvage, FNP

## 2020-02-26 ENCOUNTER — Ambulatory Visit: Payer: Medicare PPO | Admitting: Neurology

## 2020-03-05 ENCOUNTER — Encounter: Payer: Self-pay | Admitting: Family

## 2020-03-05 ENCOUNTER — Other Ambulatory Visit: Payer: Self-pay

## 2020-03-05 ENCOUNTER — Ambulatory Visit: Payer: Medicare PPO | Admitting: Family

## 2020-03-05 VITALS — BP 112/76 | HR 71 | Temp 98.0°F | Ht 64.5 in | Wt 129.4 lb

## 2020-03-05 DIAGNOSIS — R109 Unspecified abdominal pain: Secondary | ICD-10-CM | POA: Diagnosis not present

## 2020-03-05 LAB — POC URINALSYSI DIPSTICK (AUTOMATED)
Bilirubin, UA: NEGATIVE
Blood, UA: NEGATIVE
Glucose, UA: NEGATIVE
Ketones, UA: NEGATIVE
Leukocytes, UA: NEGATIVE
Nitrite, UA: NEGATIVE
Protein, UA: NEGATIVE
Spec Grav, UA: 1.01 (ref 1.010–1.025)
Urobilinogen, UA: 0.2 E.U./dL
pH, UA: 6 (ref 5.0–8.0)

## 2020-03-05 MED ORDER — METHOCARBAMOL 500 MG PO TABS: 500.0000 mg | ORAL_TABLET | Freq: Every evening | ORAL | 0 refills | Status: DC | PRN

## 2020-03-05 MED ORDER — MELOXICAM 7.5 MG PO TABS: 7.5000 mg | ORAL_TABLET | Freq: Every day | ORAL | 0 refills | Status: DC

## 2020-03-05 NOTE — Patient Instructions (Signed)
Muscle Cramps and Spasms Muscle cramps and spasms are when muscles tighten by themselves. They usually get better within minutes. Muscle cramps are painful. They are usually stronger and last longer than muscle spasms. Muscle spasms may or may not be painful. They can last a few seconds or much longer. Cramps and spasms can affect any muscle, but they occur most often in the calf muscles of the leg. They are usually not caused by a serious problem. In many cases, the cause is not known. Some common causes include:  Doing more physical work or exercise than your body is ready for.  Using the muscles too much (overuse) by repeating certain movements too many times.  Staying in a certain position for a long time.  Playing a sport or doing an activity without preparing properly.  Using bad form or technique while playing a sport or doing an activity.  Not having enough water in your body (dehydration).  Injury.  Side effects of some medicines.  Low levels of the salts and minerals in your blood (electrolytes), such as low potassium or calcium. Follow these instructions at home: Managing pain and stiffness      Massage, stretch, and relax the muscle. Do this for many minutes at a time.  If told, put heat on tight or tense muscles as often as told by your doctor. Use the heat source that your doctor recommends, such as a moist heat pack or a heating pad. ? Place a towel between your skin and the heat source. ? Leave the heat on for 20-30 minutes. ? Remove the heat if your skin turns bright red. This is very important if you are not able to feel pain, heat, or cold. You may have a greater risk of getting burned.  If told, put ice on the affected area. This may help if you are sore or have pain after a cramp or spasm. ? Put ice in a plastic bag. ? Place a towel between your skin and the bag. ? Leave the ice on for 20 minutes, 2-3 times a day.  Try taking hot showers or baths to help  relax tight muscles. Eating and drinking  Drink enough fluid to keep your pee (urine) pale yellow.  Eat a healthy diet to help ensure that your muscles work well. This should include: ? Fruits and vegetables. ? Lean protein. ? Whole grains. ? Low-fat or nonfat dairy products. General instructions  If you are having cramps often, avoid intense exercise for several days.  Take over-the-counter and prescription medicines only as told by your doctor.  Watch for any changes in your symptoms.  Keep all follow-up visits as told by your doctor. This is important. Contact a doctor if:  Your cramps or spasms get worse or happen more often.  Your cramps or spasms do not get better with time. Summary  Muscle cramps and spasms are when muscles tighten by themselves. They usually get better within minutes.  Cramps and spasms occur most often in the calf muscles of the leg.  Massage, stretch, and relax the muscle. This may help the cramp or spasm go away.  Drink enough fluid to keep your pee (urine) pale yellow. This information is not intended to replace advice given to you by your health care provider. Make sure you discuss any questions you have with your health care provider. Document Revised: 03/13/2018 Document Reviewed: 03/13/2018 Elsevier Patient Education  2020 Elsevier Inc.  

## 2020-03-05 NOTE — Progress Notes (Signed)
[name] is a 69 y.o. female with the following history as recorded in Epic[employer]:  Patient Active Problem List   Diagnosis Date Noted  . Medicare annual wellness visit, initial 06/14/2016  . Routine general medical examination at a health care facility 06/14/2016  . Caregiver with fatigue 03/30/2016  . 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 Anxiety state 04/09/2015  . Essential tremor   . Osteoporosis 09/22/2009  . FIBROIDS, UTERUS 09/10/2009  . ANEMIA-NOS 09/10/2009  . DEPRESSION 09/10/2009  . ALLERGIC RHINITIS 09/10/2009  . Hyperlipidemia 10/16/2008    Current Outpatient Medications  Medication Sig Dispense Refill  . ALPRAZolam (XANAX) 0.25 MG tablet Take 1 tablet (0.25 mg total) by mouth daily as needed for anxiety or sleep. 30 tablet 0  . Cholecalciferol (VITAMIN D3) 1000 UNITS CAPS Take by mouth daily.      . mirtazapine (REMERON) 15 MG tablet Take 1 tablet (15 mg total) by mouth at bedtime. 90 tablet 3  . polyethylene glycol powder (MIRALAX) powder Take 17 g by mouth 3 (three) times a week.     . primidone (MYSOLINE) 50 MG tablet Take 3 tablets (150 mg total) by mouth 2 (two) times daily. 540 tablet 1  . propranolol (INDERAL) 10 MG tablet TAKE 1 TABLET(10 MG) BY MOUTH TWICE DAILY 180 tablet 1  . Risedronate Sodium 35 MG TBEC   1  . meloxicam (MOBIC) 7.5 MG tablet Take 1 tablet (7.5 mg total) by mouth daily. 30 tablet 0  . methocarbamol (ROBAXIN) 500 MG tablet Take 1 tablet (500 mg total) by mouth at bedtime as needed. 30 tablet 0   No current facility-administered medications for this visit.    Allergies: Codeine, Hydrocodone-acetaminophen, Morphine and related, and Primidone  Past Medical History:  Diagnosis Date  . Actinic keratosis   . ALLERGIC RHINITIS   . ANEMIA-NOS   . Anxiety   . Cataract 2014   left cataract extraction with lens implant  . DEPRESSION   . FIBROIDS, UTERUS   . HYPERLIPIDEMIA   . Macular degeneration of both eyes 2012    mild  . 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 Neuromuscular disorder ([place]) 2014   hand tremors  . OSTEOPOROSIS 2002   on bisphos  . Sarcoidosis 2009    Past Surgical History:  Procedure Laterality Date  . BREAST SURGERY     [facility]  . CATARACT EXTRACTION Left 07/2013   dr Herbert Deaner  . Child birth  33  . COLONOSCOPY  2011  . STOMACH SURGERY  11/2008   2-3cm GIST excision  . TONSILLECTOMY  1957  . TUBAL LIGATION    . UPPER GASTROINTESTINAL ENDOSCOPY      Family History  Problem Relation Age of Onset  . Allergies Mother   . Colon cancer Mother   . Tremor Mother   . Hodgkin's lymphoma Father 27  . Dermatomyositis Sister   . Allergies Daughter   . Breast cancer Maternal Aunt   . Prostate cancer Maternal Uncle   . Esophageal cancer Maternal Grandfather   . Stomach cancer Neg Hx     Social History   Tobacco Use  . Smoking status: Never Smoker  . Smokeless tobacco: Never Used  Substance Use Topics  . Alcohol use: Yes    Alcohol/week: 3.0 - 4.0 standard drinks    Types: 3 - 4 Glasses of wine per week  Subjective:  Left flank pain x 5 days; woke up Sunday morning with the pain; most noticeable with movement- very painful with changing positions; no blood in urine/ no urinary changes noted; does feel that symptoms have been getting better in the past 24 hours.     Objective:  Vitals:   03/05/20 0932  BP: 112/76  Pulse: 71  Temp: 98 F (36.7 C)  TempSrc: Oral  SpO2: 98%  Weight: 129 lb 6.4 oz (58.7 kg)  Height: 5' 4.5" (1.638 m)    General: Well developed, well nourished, in no acute distress  Skin : Warm and dry.  Head: Normocephalic and atraumatic  Lungs: Respirations unlabored; clear to auscultation bilaterally without wheeze, rales, rhonchi  CVS exam: normal rate and regular rhythm.  Abdomen: Soft; nontender; nondistended; normoactive bowel  sounds; no masses or hepatosplenomegaly  Musculoskeletal: No deformities; no active joint inflammation  Extremities: No edema, cyanosis, clubbing  Vessels: Symmetric bilaterally  Neurologic: Alert and oriented; speech intact; face symmetrical; moves all extremities well; CNII-XII intact without focal deficit   Assessment:  1. Acute left flank pain     Plan:  Suspect muscular source; U/A is unremarkable; physical exam is reassuring; Rx for Mobic and Robaxin; apply heat, increase water and follow-up worse, no better.  This visit occurred during the SARS-CoV-2 public health emergency.  Safety protocols were in place, including screening questions prior to the visit, additional usage of staff PPE, and extensive cleaning of exam room while observing appropriate contact time as indicated for disinfecting solutions.      No follow-ups on file.  No orders of the defined types were placed in this encounter.   Requested Prescriptions   Signed Prescriptions Disp Refills  . meloxicam (MOBIC) 7.5 MG tablet 30 tablet 0    Sig: Take 1 tablet (7.5 mg total) by mouth daily.  . methocarbamol (ROBAXIN) 500 MG tablet 30 tablet 0    Sig: Take 1 tablet (500 mg total) by mouth at bedtime as needed.

## 2020-03-05 NOTE — Addendum Note (Signed)
Addended by: Marcina Millard on: 03/05/2020 04:39 PM   Modules accepted: Orders

## 2020-03-11 NOTE — Progress Notes (Signed)
Assessment/Plan:    1.  Essential Tremor  -Patient and I looked through her Archimedes spirals from 2013 forward.  She really does not think that they have changed significantly, and I do not really disagree with her.  Ultimately, she would like to try and wean off of medication.  We will do this slowly, over the next 7 weeks.  She will for stop the propranolol.  Then, over the following 6 weeks, she will wean off of the primidone, dropping 1 pill each week.  I will then plan on seeing her back in the next 4 months.  At that point in time, we can decide how her tremor is.  If she wants to pursue further medication, we can discuss medication such as topiramate.  She has not wanted to pursue trihexyphenidyl in the past.  Subjective:   [name] was seen today in follow up for essential tremor.  My previous records were reviewed prior to todays visit. Tremor has been stable - not better, not worse and she wonders if meds are doing anything.  She thinks that she would like to try to get off medication and see how she how does.  Pt denies falls.  Pt denies lightheadedness, near syncope.  No hallucinations.  Mood has been good.  Current prescribed movement disorder medications: Primidone, 150 mg twice per day Propranolol, 10 mg twice per day   ALLERGIES:   Allergies  Allergen Reactions  . Codeine Nausea Only    REACTION: nausea  . Hydrocodone-Acetaminophen Nausea Only  . Morphine And Related Nausea Only    Intense nausea  . Primidone     CURRENT MEDICATIONS:  Outpatient Encounter Medications as of 03/13/2020  Medication Sig  . ALPRAZolam (XANAX) 0.25 MG tablet Take 1 tablet (0.25 mg total) by mouth daily as needed for anxiety or sleep.  . Cholecalciferol (VITAMIN D3) 1000 UNITS CAPS Take by mouth daily.    . mirtazapine (REMERON) 15 MG tablet Take 1 tablet (15 mg total) by mouth at bedtime.  . polyethylene glycol powder (MIRALAX) powder Take 17 g by mouth as needed.   .  primidone (MYSOLINE) 50 MG tablet Take 3 tablets (150 mg total) by mouth 2 (two) times daily.  . propranolol (INDERAL) 10 MG tablet TAKE 1 TABLET(10 MG) BY MOUTH TWICE DAILY  . Risedronate Sodium 35 MG TBEC Take 1 tablet by mouth once a week.   . [DISCONTINUED] meloxicam (MOBIC) 7.5 MG tablet Take 1 tablet (7.5 mg total) by mouth daily. (Patient not taking: Reported on 03/13/2020)  . [DISCONTINUED] methocarbamol (ROBAXIN) 500 MG tablet Take 1 tablet (500 mg total) by mouth at bedtime as needed. (Patient not taking: Reported on 03/13/2020)   No facility-administered encounter medications on file as of 03/13/2020.     Objective:    PHYSICAL EXAMINATION:    VITALS:   Vitals:   03/13/20 1417  BP: 114/72  Pulse: 67  SpO2: 97%  Weight: 130 lb (59 kg)  Height: 5' 4.5" (1.638 m)    GEN:  The patient appears stated age and is in NAD. HEENT:  Normocephalic, atraumatic.  The mucous membranes are moist. The superficial temporal arteries are without ropiness or tenderness. CV:  RRR Lungs:  CTAB Neck/HEME:  There are no carotid bruits bilaterally.  Neurological examination:  Orientation: The patient is alert and oriented x3. Cranial nerves: There is good facial symmetry. The speech is fluent and clear. Soft palate rises symmetrically and there is no tongue deviation. Hearing  is intact to conversational tone. Sensation: Sensation is intact to light touch throughout Motor: Strength is at least antigravity x4.  Movement examination: Tone: There is normal tone in the UE/LE Abnormal movements: there is just mild postural tremor Gait and Station: The patient has no difficulty arising out of a deep-seated chair without the use of the hands.    Chemistry      Component Value Date/Time   NA 138 08/27/2019 0848   K 5.2 (H) 08/27/2019 0848   CL 102 08/27/2019 0848   CO2 30 08/27/2019 0848   BUN 16 08/27/2019 0848   CREATININE 0.71 08/27/2019 0848      Component Value Date/Time   CALCIUM 9.3  08/27/2019 0848   ALKPHOS 56 08/27/2019 0848   AST 22 08/27/2019 0848   ALT 23 08/27/2019 0848   BILITOT 0.3 08/27/2019 0848     Lab Results  Component Value Date   WBC 6.2 08/27/2019   HGB 15.8 (H) 08/27/2019   HCT 46.6 (H) 08/27/2019   MCV 95.2 08/27/2019   PLT 260.0 08/27/2019      Total time spent on today's visit was 25 minutes, including both face-to-face time and nonface-to-face time.  Time included that spent on review of records (prior notes available to me/labs/imaging if pertinent), discussing treatment and goals, answering patient's questions and coordinating care.  Cc:  Marrian Salvage, FNP

## 2020-03-13 ENCOUNTER — Encounter: Payer: Self-pay | Admitting: Neurology

## 2020-03-13 ENCOUNTER — Ambulatory Visit: Payer: Medicare PPO | Admitting: Neurology

## 2020-03-13 ENCOUNTER — Other Ambulatory Visit: Payer: Self-pay

## 2020-03-13 VITALS — BP 114/72 | HR 67 | Ht 64.5 in | Wt 130.0 lb

## 2020-03-13 DIAGNOSIS — G250 Essential tremor: Secondary | ICD-10-CM | POA: Diagnosis not present

## 2020-03-13 NOTE — Patient Instructions (Signed)
1.  Week 1:  Stop propranolol 2.  Week 2-7:  Each week, I want you to drop 1 pill of primidone (you are on 6 pills total so it will take you 6 weeks to get off of it)

## 2020-04-18 ENCOUNTER — Other Ambulatory Visit: Payer: Self-pay

## 2020-04-18 MED ORDER — PROPRANOLOL HCL 10 MG PO TABS: ORAL_TABLET | ORAL | 1 refills | Status: DC

## 2020-04-18 NOTE — Telephone Encounter (Signed)
Rx(s) sent to pharmacy electronically.  

## 2020-07-17 ENCOUNTER — Other Ambulatory Visit: Payer: Self-pay | Admitting: Family

## 2020-07-17 DIAGNOSIS — F411 Generalized anxiety disorder: Secondary | ICD-10-CM

## 2020-07-17 DIAGNOSIS — R5383 Other fatigue: Secondary | ICD-10-CM

## 2020-08-19 ENCOUNTER — Encounter: Payer: Self-pay | Admitting: Neurology

## 2020-08-19 NOTE — Progress Notes (Signed)
Assessment/Plan:    1.  Essential Tremor  -After some discussion, we decided to start Topamax and work to 100 mg nightly.  R/B/SE were discussed.  The opportunity to ask questions was given and they were answered to the best of my ability.  The patient expressed understanding and willingness to follow the outlined treatment protocols.  Subjective:   [name] was seen today in follow up for essential tremor.  My previous records were reviewed prior to todays visit.  Last visit, the patient decided to go ahead and wean off of both primidone and propranolol.  We did this over several months.  We decided after she was off her medications, that we did then decide if he wanted to pursue topiramate.  She has not wanted to pursue trihexyphenidyl. After going off the medications, she really does not think she is any worse than she previously was, but tremor is still bothersome to her.  Current prescribed movement disorder medications: None   PREVIOUS MEDICATIONS: Primidone (150 mg twice per day); propranolol  ALLERGIES:   Allergies  Allergen Reactions  . Codeine Nausea Only    REACTION: nausea  . Hydrocodone-Acetaminophen Nausea Only  . Morphine And Related Nausea Only    Intense nausea  . Primidone     CURRENT MEDICATIONS:  Outpatient Encounter Medications as of 08/21/2020  Medication Sig  . Cholecalciferol (VITAMIN D3) 1000 UNITS CAPS Take by mouth daily.    . diphenhydramine-acetaminophen (TYLENOL PM) 25-500 MG TABS tablet Take 1 tablet by mouth at bedtime as needed.  . mirtazapine (REMERON) 15 MG tablet TAKE 1 TABLET(15 MG) BY MOUTH AT BEDTIME  . polyethylene glycol powder (MIRALAX) powder Take 17 g by mouth as needed.   . Risedronate Sodium 35 MG TBEC Take 1 tablet by mouth once a week.   . topiramate (TOPAMAX) 100 MG tablet Take 1 tablet (100 mg total) by mouth daily.  Marland Kitchen topiramate (TOPAMAX) 25 MG tablet 1 tablet q day x 1 week, then 2 po qd x 1 week and then 3 po q day   . [DISCONTINUED] ALPRAZolam (XANAX) 0.25 MG tablet Take 1 tablet (0.25 mg total) by mouth daily as needed for anxiety or sleep.  . [DISCONTINUED] primidone (MYSOLINE) 50 MG tablet Take 3 tablets (150 mg total) by mouth 2 (two) times daily.  . [DISCONTINUED] propranolol (INDERAL) 10 MG tablet TAKE 1 TABLET(10 MG) BY MOUTH TWICE DAILY   No facility-administered encounter medications on file as of 08/21/2020.     Objective:    PHYSICAL EXAMINATION:    VITALS:   Vitals:   08/21/20 1500  BP: 123/66  Pulse: 84  SpO2: 95%  Weight: 129 lb (58.5 kg)  Height: 5\' 4"  (1.626 m)    GEN:  The patient appears stated age and is in NAD. HEENT:  Normocephalic, atraumatic.  The mucous membranes are moist. The superficial temporal arteries are without ropiness or tenderness. CV:  RRR Lungs:  CTAB Neck/HEME:  There are no carotid bruits bilaterally.  Neurological examination:  Orientation: The patient is alert and oriented x3. Cranial nerves: There is good facial symmetry. The speech is fluent and clear. Soft palate rises symmetrically and there is no tongue deviation. Hearing is intact to conversational tone. Sensation: Sensation is intact to light touch throughout Motor: Strength is at least antigravity x4.  Movement examination: Tone: There is normal tone in the UE/LE Abnormal movements: There is mild postural tremor bilaterally. The right is slightly worse than the left. She  has mild trouble with Archimedes spirals, no different than previous. Gait and Station: The patient has no difficulty arising out of a deep-seated chair without the use of the hands. The patient's stride length is good I have reviewed and interpreted the following labs independently   Chemistry      Component Value Date/Time   NA 138 08/27/2019 0848   K 5.2 (H) 08/27/2019 0848   CL 102 08/27/2019 0848   CO2 30 08/27/2019 0848   BUN 16 08/27/2019 0848   CREATININE 0.71 08/27/2019 0848      Component Value  Date/Time   CALCIUM 9.3 08/27/2019 0848   ALKPHOS 56 08/27/2019 0848   AST 22 08/27/2019 0848   ALT 23 08/27/2019 0848   BILITOT 0.3 08/27/2019 0848      Lab Results  Component Value Date   WBC 6.2 08/27/2019   HGB 15.8 (H) 08/27/2019   HCT 46.6 (H) 08/27/2019   MCV 95.2 08/27/2019   PLT 260.0 08/27/2019   Lab Results  Component Value Date   TSH 2.67 10/28/2015     Chemistry      Component Value Date/Time   NA 138 08/27/2019 0848   K 5.2 (H) 08/27/2019 0848   CL 102 08/27/2019 0848   CO2 30 08/27/2019 0848   BUN 16 08/27/2019 0848   CREATININE 0.71 08/27/2019 0848      Component Value Date/Time   CALCIUM 9.3 08/27/2019 0848   ALKPHOS 56 08/27/2019 0848   AST 22 08/27/2019 0848   ALT 23 08/27/2019 0848   BILITOT 0.3 08/27/2019 0848         Total time spent on today's visit was 25 minutes, including both face-to-face time and nonface-to-face time.  Time included that spent on review of records (prior notes available to me/labs/imaging if pertinent), discussing treatment and goals, answering patient's questions and coordinating care.  Cc:  Marrian Salvage, FNP

## 2020-08-21 ENCOUNTER — Encounter: Payer: Self-pay | Admitting: Neurology

## 2020-08-21 ENCOUNTER — Ambulatory Visit: Payer: Medicare PPO | Admitting: Neurology

## 2020-08-21 ENCOUNTER — Other Ambulatory Visit: Payer: Self-pay

## 2020-08-21 VITALS — BP 123/66 | HR 84 | Ht 64.0 in | Wt 129.0 lb

## 2020-08-21 DIAGNOSIS — G250 Essential tremor: Secondary | ICD-10-CM

## 2020-08-21 MED ORDER — TOPIRAMATE 25 MG PO TABS
ORAL_TABLET | ORAL | 0 refills | Status: DC
Start: 2020-08-21 — End: 2021-02-10

## 2020-08-21 MED ORDER — TOPIRAMATE 100 MG PO TABS
100.0000 mg | ORAL_TABLET | Freq: Every day | ORAL | 1 refills | Status: DC
Start: 2020-08-21 — End: 2021-02-10

## 2020-08-21 NOTE — Patient Instructions (Addendum)
Start topamax - 25 mg - 1 tablet daily for 1 week, then 2 tablets daily for 1 week and then 3 tablets daily for 1 week and then you will run out of that RX and you will start topamax (topiramate) - 100 mg daily  The physicians and staff at [facility] are committed to providing excellent care. You may receive a survey requesting feedback about your experience at our office. We strive to receive "very good" responses to the survey questions. If you feel that your experience would prevent you from giving the office a "very good " response, please contact our office to try to remedy the situation. We may be reached at [phone]. Thank you for taking the time out of your busy day to complete the survey.

## 2020-09-01 ENCOUNTER — Telehealth: Payer: Self-pay | Admitting: Family

## 2020-09-01 NOTE — Telephone Encounter (Signed)
Called patient for more clarity. Patient states she received booster on Thursday and have been coughing, no fever, nasal congestion, sneezing since Friday.

## 2020-09-01 NOTE — Telephone Encounter (Signed)
Patient notified

## 2020-09-01 NOTE — Telephone Encounter (Signed)
Please get clarification about her symptoms; just because she's coughing doesn't automatically mean she has COVID or needs a COVID test.

## 2020-09-01 NOTE — Telephone Encounter (Signed)
It sounds like she might have a cold; I don't think we have to do a COVID test; if no improvement in 48 hours, let us know and we will get her scheduled for a test.

## 2020-09-01 NOTE — Telephone Encounter (Signed)
Patient called and said she got her Covid 70 Booster shot on 10.28.2021 and she started coughing on 10.29.2021 and she was wondering if she should go get a Covid test. Please call the patient [phone]

## 2020-09-02 ENCOUNTER — Ambulatory Visit: Payer: Medicare PPO | Admitting: Neurology

## 2020-09-05 ENCOUNTER — Encounter: Payer: Medicare PPO | Admitting: Family

## 2020-09-10 ENCOUNTER — Ambulatory Visit: Payer: Medicare PPO

## 2020-09-18 ENCOUNTER — Other Ambulatory Visit: Payer: Medicare PPO

## 2020-09-18 DIAGNOSIS — Z20822 Contact with and (suspected) exposure to covid-19: Secondary | ICD-10-CM

## 2020-09-19 LAB — SARS-COV-2, NAA 2 DAY TAT

## 2020-09-19 LAB — NOVEL CORONAVIRUS, NAA: SARS-CoV-2, NAA: NOT DETECTED

## 2020-10-06 ENCOUNTER — Telehealth: Payer: Self-pay

## 2020-10-06 NOTE — Telephone Encounter (Signed)
Spoke with patient who stated Dr Tat started her on Topiramate. She states she thinks she is experiencing some side effects from this medication. Patient states she has been having a cough, fatigue, loss of appetite, and depression. Patient states she looked up the side of effects of this medication and noticed her symptoms on there. Patient states she has been sick for about a month and a half.  She thought it was allergies or covid and got tested twice. She states she thought she had covid but both test came back negative.   She wants to know what Dr Tat suggests and if she can stop this medication or be weaned down off of it.   Advised her that I would speak with Dr Tat and give her a call back. She voiced understanding.

## 2020-10-06 NOTE — Telephone Encounter (Signed)
Per Dr.Tat can cut 100mg  in half x one week and Dc Toprimate. Patient advised and voiced understanding.

## 2020-10-06 NOTE — Telephone Encounter (Signed)
It doesn't cause cough, fatigue, depression (is actually used to treat depression) but definitely can cause loss of appetite (we discussed that when prescribed).  If she wants to d/c it she can do that and see how she feels though

## 2020-10-15 ENCOUNTER — Other Ambulatory Visit: Payer: Self-pay | Admitting: Family

## 2020-10-15 DIAGNOSIS — R5383 Other fatigue: Secondary | ICD-10-CM

## 2020-10-15 DIAGNOSIS — F411 Generalized anxiety disorder: Secondary | ICD-10-CM

## 2020-10-17 ENCOUNTER — Encounter: Payer: Self-pay | Admitting: Family

## 2020-10-17 ENCOUNTER — Other Ambulatory Visit: Payer: Self-pay

## 2020-10-17 ENCOUNTER — Other Ambulatory Visit: Payer: Self-pay | Admitting: Family

## 2020-10-17 ENCOUNTER — Ambulatory Visit (INDEPENDENT_AMBULATORY_CARE_PROVIDER_SITE_OTHER): Payer: Medicare PPO

## 2020-10-17 ENCOUNTER — Ambulatory Visit: Payer: Medicare PPO | Admitting: Family

## 2020-10-17 VITALS — BP 130/78 | HR 80 | Temp 98.1°F | Ht 64.0 in | Wt 127.0 lb

## 2020-10-17 DIAGNOSIS — Z0000 Encounter for general adult medical examination without abnormal findings: Secondary | ICD-10-CM

## 2020-10-17 DIAGNOSIS — R053 Chronic cough: Secondary | ICD-10-CM | POA: Diagnosis not present

## 2020-10-17 DIAGNOSIS — M810 Age-related osteoporosis without current pathological fracture: Secondary | ICD-10-CM

## 2020-10-17 DIAGNOSIS — R079 Chest pain, unspecified: Secondary | ICD-10-CM

## 2020-10-17 DIAGNOSIS — Z13220 Encounter for screening for lipoid disorders: Secondary | ICD-10-CM | POA: Diagnosis not present

## 2020-10-17 LAB — CBC WITH DIFFERENTIAL/PLATELET
Basophils Absolute: 0.1 10*3/uL (ref 0.0–0.1)
Basophils Relative: 1.4 % (ref 0.0–3.0)
Eosinophils Absolute: 0.6 10*3/uL (ref 0.0–0.7)
Eosinophils Relative: 8.8 % — ABNORMAL HIGH (ref 0.0–5.0)
HCT: 45.8 % (ref 36.0–46.0)
Hemoglobin: 15.4 g/dL — ABNORMAL HIGH (ref 12.0–15.0)
Lymphocytes Relative: 29.1 % (ref 12.0–46.0)
Lymphs Abs: 1.9 10*3/uL (ref 0.7–4.0)
MCHC: 33.7 g/dL (ref 30.0–36.0)
MCV: 91.4 fl (ref 78.0–100.0)
Monocytes Absolute: 0.4 10*3/uL (ref 0.1–1.0)
Monocytes Relative: 6.7 % (ref 3.0–12.0)
Neutro Abs: 3.6 10*3/uL (ref 1.4–7.7)
Neutrophils Relative %: 54 % (ref 43.0–77.0)
Platelets: 295 10*3/uL (ref 150.0–400.0)
RBC: 5.01 Mil/uL (ref 3.87–5.11)
RDW: 13.6 % (ref 11.5–15.5)
WBC: 6.7 10*3/uL (ref 4.0–10.5)

## 2020-10-17 LAB — COMPREHENSIVE METABOLIC PANEL
ALT: 15 U/L (ref 0–35)
AST: 18 U/L (ref 0–37)
Albumin: 4.2 g/dL (ref 3.5–5.2)
Alkaline Phosphatase: 54 U/L (ref 39–117)
BUN: 13 mg/dL (ref 6–23)
CO2: 29 mEq/L (ref 19–32)
Calcium: 9.6 mg/dL (ref 8.4–10.5)
Chloride: 104 mEq/L (ref 96–112)
Creatinine, Ser: 0.8 mg/dL (ref 0.40–1.20)
GFR: 75.19 mL/min (ref >60.00)
Glucose, Bld: 93 mg/dL (ref 70–99)
Potassium: 3.9 mEq/L (ref 3.5–5.1)
Sodium: 138 mEq/L (ref 135–145)
Total Bilirubin: 0.5 mg/dL (ref 0.2–1.2)
Total Protein: 7.6 g/dL (ref 6.0–8.3)

## 2020-10-17 LAB — LIPID PANEL
Cholesterol: 224 mg/dL — ABNORMAL HIGH (ref 0–200)
HDL: 41.7 mg/dL (ref >39.00)
LDL Cholesterol: 150 mg/dL — ABNORMAL HIGH (ref 0–99)
NonHDL: 182.58
Total CHOL/HDL Ratio: 5
Triglycerides: 162 mg/dL — ABNORMAL HIGH (ref 0.0–149.0)
VLDL: 32.4 mg/dL (ref 0.0–40.0)

## 2020-10-17 LAB — URINALYSIS
Bilirubin Urine: NEGATIVE
Ketones, ur: NEGATIVE
Leukocytes,Ua: NEGATIVE
Nitrite: NEGATIVE
Specific Gravity, Urine: 1.01 (ref 1.000–1.030)
Total Protein, Urine: NEGATIVE
Urine Glucose: NEGATIVE
Urobilinogen, UA: 0.2 (ref 0.0–1.0)
pH: 6 (ref 5.0–8.0)

## 2020-10-17 LAB — VITAMIN D 25 HYDROXY (VIT D DEFICIENCY, FRACTURES): VITD: 46.36 ng/mL (ref 30.00–100.00)

## 2020-10-17 MED ORDER — PREDNISONE 20 MG PO TABS: 20.0000 mg | ORAL_TABLET | Freq: Every day | ORAL | 0 refills | Status: DC

## 2020-10-17 MED ORDER — DOXYCYCLINE HYCLATE 100 MG PO TABS: 100.0000 mg | ORAL_TABLET | Freq: Two times a day (BID) | ORAL | 0 refills | Status: DC

## 2020-10-17 MED ORDER — BENZONATATE 200 MG PO CAPS: 200.0000 mg | ORAL_CAPSULE | Freq: Three times a day (TID) | ORAL | 0 refills | Status: DC | PRN

## 2020-10-17 NOTE — Progress Notes (Signed)
ADELA ESTEBAN is a 69 y.o. female with the following history as recorded in Epic[employer]:  Patient Active Problem List   Diagnosis Date Noted  . Medicare annual wellness visit, initial 06/14/2016  . Routine general medical examination at a health care facility 06/14/2016  . Caregiver with fatigue 03/30/2016  . 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 Anxiety state 04/09/2015  . Essential tremor   . Osteoporosis 09/22/2009  . FIBROIDS, UTERUS 09/10/2009  . ANEMIA-NOS 09/10/2009  . DEPRESSION 09/10/2009  . ALLERGIC RHINITIS 09/10/2009  . Hyperlipidemia 10/16/2008    Current Outpatient Medications  Medication Sig Dispense Refill  . Cholecalciferol (VITAMIN D3) 1000 UNITS CAPS Take by mouth daily.    . diphenhydramine-acetaminophen (TYLENOL PM) 25-500 MG TABS tablet Take 1 tablet by mouth at bedtime as needed.    . mirtazapine (REMERON) 15 MG tablet TAKE 1 TABLET(15 MG) BY MOUTH AT BEDTIME 90 tablet 0  . polyethylene glycol powder (GLYCOLAX/MIRALAX) 17 GM/SCOOP powder Take 17 g by mouth as needed.     . Risedronate Sodium 35 MG TBEC Take 1 tablet by mouth once a week.   1  . benzonatate (TESSALON) 200 MG capsule Take 1 capsule (200 mg total) by mouth 3 (three) times daily as needed. 30 capsule 0  . predniSONE (DELTASONE) 20 MG tablet Take 1 tablet (20 mg total) by mouth daily with breakfast. 5 tablet 0  . topiramate (TOPAMAX) 100 MG tablet Take 1 tablet (100 mg total) by mouth daily. (Patient not taking: Reported on 10/17/2020) 90 tablet 1  . topiramate (TOPAMAX) 25 MG tablet 1 tablet q day x 1 week, then 2 po qd x 1 week and then 3 po q day (Patient not taking: Reported on 10/17/2020) 73 tablet 0   No current facility-administered medications for this visit.    Allergies: Augmentin [amoxicillin-pot clavulanate], Azithromycin, Codeine, Hydrocodone-acetaminophen, Morphine and related, and Primidone  Past Medical History:  Diagnosis Date  . Actinic keratosis   . ALLERGIC  RHINITIS   . ANEMIA-NOS   . Anxiety   . Cataract 2014   left cataract extraction with lens implant  . DEPRESSION   . FIBROIDS, UTERUS   . HYPERLIPIDEMIA   . Macular degeneration of both eyes 2012   mild  . 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 OSTEOPOROSIS 2002   on bisphos  . Sarcoidosis 2009    Past Surgical History:  Procedure Laterality Date  . BREAST SURGERY     [facility]  . CATARACT EXTRACTION Left 07/2013   dr Herbert Deaner  . Child birth  15  . COLONOSCOPY  2011  . STOMACH SURGERY  11/2008   2-3cm GIST excision  . TONSILLECTOMY  1957  . TUBAL LIGATION    . UPPER GASTROINTESTINAL ENDOSCOPY      Family History  Problem Relation Age of Onset  . Allergies Mother   . Colon cancer Mother   . Tremor Mother   . Hodgkin's lymphoma Father 15  . Dermatomyositis Sister   . Allergies Daughter   . Breast cancer Maternal Aunt   . Prostate cancer Maternal Uncle   . Esophageal cancer Maternal Grandfather   . Stomach cancer Neg Hx     Social History   Tobacco Use  . Smoking status: Never Smoker  . Smokeless tobacco: Never Used  Substance Use Topics  . Alcohol use: Yes    Alcohol/week: 3.0 -  4.0 standard drinks    Types: 3 - 4 Glasses of wine per week    Comment: 2glasses twice a week     Subjective:   Presents for yearly CPE;  Continuing to see her neurologist every 6 months; Has been struggling with chronic cough x 2 months; "feels like a spasm"; no history of GERD; tried tapering off Topamax but no benefit for cough;   Review of Systems  Constitutional: Negative.   HENT: Negative.   Eyes: Negative.   Respiratory: Positive for cough.   Cardiovascular: Negative.   Gastrointestinal: Negative.   Genitourinary: Negative.   Musculoskeletal: Negative.   Skin: Negative.   Neurological: Negative.   Endo/Heme/Allergies: Negative.   Psychiatric/Behavioral:  Negative.     Objective:  Vitals:   10/17/20 1038  BP: 130/78  Pulse: 80  Temp: 98.1 F (36.7 C)  TempSrc: Oral  SpO2: 98%  Weight: 127 lb (57.6 kg)  Height: $Remove'5\' 4"'sSRiSnB$  (1.626 m)    General: Well developed, well nourished, in no acute distress  Skin : Warm and dry.  Head: Normocephalic and atraumatic  Eyes: Sclera and conjunctiva clear; pupils round and reactive to light; extraocular movements intact  Ears: External normal; canals clear; tympanic membranes normal  Oropharynx: Pink, supple. No suspicious lesions  Neck: Supple without thyromegaly, adenopathy  Lungs: Respirations unlabored; clear to auscultation bilaterally without wheeze, rales, rhonchi  CVS exam: normal rate and regular rhythm.  Abdomen: Soft; nontender; nondistended; normoactive bowel sounds; no masses or hepatosplenomegaly  Musculoskeletal: No deformities; no active joint inflammation  Extremities: No edema, cyanosis, clubbing  Vessels: Symmetric bilaterally  Neurologic: Alert and oriented; speech intact; face symmetrical; moves all extremities well; CNII-XII intact without focal deficit   Assessment:  1. PE (physical exam), annual   2. Chronic cough   3. Lipid screening   4. Osteoporosis, unspecified osteoporosis type, unspecified pathological fracture presence     Plan:  Age appropriate preventive healthcare needs addressed; encouraged regular eye doctor and dental exams; encouraged regular exercise; will update labs and refills as needed today; follow-up to be determined; CXR updated- to consider antibiotics but will go ahead and have patient re-start Claritin and Tessalon Perles; add prednisone x 5 days; follow-up to be determined;  This visit occurred during the SARS-CoV-2 public health emergency.  Safety protocols were in place, including screening questions prior to the visit, additional usage of staff PPE, and extensive cleaning of exam room while observing appropriate contact time as indicated for  disinfecting solutions.      No follow-ups on file.  Orders Placed This Encounter  Procedures  . DG Chest 2 View    Standing Status:   Future    Number of Occurrences:   1    Standing Expiration Date:   10/17/2021    Order Specific Question:   Reason for Exam (SYMPTOM  OR DIAGNOSIS REQUIRED)    Answer:   cough x 2 months    Order Specific Question:   Preferred imaging location?    Answer:   [facility]  . CBC with Differential/Platelet    Standing Status:   Future    Number of Occurrences:   1    Standing Expiration Date:   10/17/2021  . Comp Met (CMET)    Standing Status:   Future    Number of Occurrences:   1    Standing Expiration Date:   10/17/2021  . Lipid panel    Standing Status:   Future    Number  of Occurrences:   1    Standing Expiration Date:   10/17/2021  . Vitamin D (25 hydroxy)    Standing Status:   Future    Number of Occurrences:   1    Standing Expiration Date:   10/17/2021  . Urinalysis    Standing Status:   Future    Number of Occurrences:   1    Standing Expiration Date:   10/17/2021    Requested Prescriptions   Signed Prescriptions Disp Refills  . predniSONE (DELTASONE) 20 MG tablet 5 tablet 0    Sig: Take 1 tablet (20 mg total) by mouth daily with breakfast.  . benzonatate (TESSALON) 200 MG capsule 30 capsule 0    Sig: Take 1 capsule (200 mg total) by mouth 3 (three) times daily as needed.

## 2020-10-20 ENCOUNTER — Other Ambulatory Visit: Payer: Self-pay | Admitting: Family

## 2020-10-20 ENCOUNTER — Ambulatory Visit
Admission: RE | Admit: 2020-10-20 | Discharge: 2020-10-20 | Disposition: A | Discharge status: Home / Self Care | Payer: Medicare PPO | Source: Ambulatory Visit | Attending: Family | Admitting: Family

## 2020-10-20 ENCOUNTER — Telehealth: Payer: Self-pay

## 2020-10-20 DIAGNOSIS — D869 Sarcoidosis, unspecified: Secondary | ICD-10-CM

## 2020-10-20 DIAGNOSIS — R053 Chronic cough: Secondary | ICD-10-CM

## 2020-10-20 DIAGNOSIS — R079 Chest pain, unspecified: Secondary | ICD-10-CM

## 2020-10-20 DIAGNOSIS — R9389 Abnormal findings on diagnostic imaging of other specified body structures: Secondary | ICD-10-CM

## 2020-10-20 DIAGNOSIS — R591 Generalized enlarged lymph nodes: Secondary | ICD-10-CM

## 2020-10-20 MED ORDER — IOPAMIDOL (ISOVUE-370) INJECTION 76%
75.0000 mL | Freq: Once | INTRAVENOUS | Status: AC | PRN
  Administered 2020-10-20: 75 mL via INTRAVENOUS

## 2020-10-20 NOTE — Telephone Encounter (Signed)
Areas of patchy infiltrate, most notably in the apical segment of the right upper lobe. Scattered areas of infiltrate elsewhere likely other foci of pneumonia. Note that areas of underlying mild scarring and atelectasis could be sequelae of prior sarcoidosis. There is a degree of underlying centrilobular emphysematous change.

## 2020-10-20 NOTE — Progress Notes (Signed)
Reviewed with patient; urgent referral to pulmonology;

## 2020-11-04 ENCOUNTER — Telehealth: Payer: Self-pay | Admitting: Family

## 2020-11-04 NOTE — Telephone Encounter (Signed)
Patient has finished her antibiotic and is still having a cough, patient wondering if there is anything she needs to be doing before her appointment with pulmonology on Friday.  [phone]

## 2020-11-07 ENCOUNTER — Ambulatory Visit: Payer: Medicare PPO | Admitting: Emergency Medicine

## 2020-11-07 ENCOUNTER — Other Ambulatory Visit: Payer: Self-pay

## 2020-11-07 ENCOUNTER — Encounter: Payer: Self-pay | Admitting: Emergency Medicine

## 2020-11-07 DIAGNOSIS — R053 Chronic cough: Secondary | ICD-10-CM | POA: Diagnosis not present

## 2020-11-07 DIAGNOSIS — D869 Sarcoidosis, unspecified: Secondary | ICD-10-CM

## 2020-11-07 MED ORDER — OMEPRAZOLE 20 MG PO CPDR: 20.0000 mg | DELAYED_RELEASE_CAPSULE | Freq: Two times a day (BID) | ORAL | 2 refills | Status: DC

## 2020-11-07 NOTE — Progress Notes (Signed)
Subjective:    Patient ID: [name], female    DOB: December 27, 1950, 70 y.o.   MRN: [id number]  HPI 70 year old never smoker with a history of allergic rhinitis, uterine fibroids, hyperlipidemia, malignant GIST tumor with resection in 2010.  Also carries a history of sarcoidosis that was made by biopsy via EUS in 2009, showed nonnecrotizing granulomas with negative cultures and negative flow cytometry.  She presents today for evaluation of cough. She has been experiencing since October '21, no real identifiable trigger, although got worse when she got her COVID booster that month. Typically was short spells, non-productive. Became more frequent and paroxysmal spells after the COVID booster. Still non productive. She has been treated with Augmentin for possible bronchitis / PNA in November but had to stop due to diarrhea. She was seen 12/17 with a CXR, was treated with doxy and pred x 5 days, tessalon. She didn't notice much difference in the cough.  She is not on any BD therapy. She denies any GERD sx, does have some PND and drainage, not on any allergy meds right now. Tried loratadine x 2 weeks without any results. No dyspnea, no wheeze.   CXR 12/17 >> lingular atelectasis CT chest 10/20/2020 reviewed by me shows multifocal mediastinal and hilar lymphadenopathy that has not changed significantly compared with 2010 scan.  There are areas of patchy groundglass infiltrate especially in the right upper lobe, some underlying centrilobular emphysematous change.  There is small pericardial effusion, no evidence of PE      Review of Systems As per HPI  Past Medical History:  Diagnosis Date  . Actinic keratosis   . ALLERGIC RHINITIS   . ANEMIA-NOS   . Anxiety   . Cataract 2014   left cataract extraction with lens implant  . DEPRESSION   . FIBROIDS, UTERUS   . HYPERLIPIDEMIA   . Macular degeneration of both eyes 2012   mild  . 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 OSTEOPOROSIS 2002   on bisphos  . Sarcoidosis 2009     Family History  Problem Relation Age of Onset  . Allergies Mother   . Colon cancer Mother   . Tremor Mother   . Hodgkin's lymphoma Father 56  . Dermatomyositis Sister   . Allergies Daughter   . Breast cancer Maternal Aunt   . Prostate cancer Maternal Uncle   . Esophageal cancer Maternal Grandfather   . Stomach cancer Neg Hx      Social History   Socioeconomic History  . Marital status: Divorced    Spouse name: Not on file  . Number of children: 1  . Years of education: Not on file  . Highest education level: Bachelor's degree (e.g., BA, AB, BS)  Occupational History  . 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 Smoking status: Never Smoker  . Smokeless tobacco: Never Used  Vaping Use  . Vaping Use: Never used  Substance and Sexual Activity  . Alcohol use: Yes    Alcohol/week: 3.0 - 4.0 standard drinks    Types: 3 - 4 Glasses of wine per week    Comment: 2glasses twice a week   . Drug use: No  . Sexual activity: Not Currently  Other Topics Concern  . Not on file  Social History Narrative   Divorced, lives alone. Pt has 1  dtr. 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 Augmentin [Amoxicillin-Pot Clavulanate] Other (See Comments)    Diarrhea  . Azithromycin Diarrhea  . Codeine Nausea Only    REACTION: nausea  . Hydrocodone-Acetaminophen Nausea Only  . Morphine And Related Nausea Only    Intense nausea  . Primidone       Outpatient Medications Prior to Visit  Medication Sig Dispense Refill  . benzonatate (TESSALON) 200 MG capsule Take 1 capsule (200 mg total) by mouth 3 (three) times daily as needed. 30 capsule 0  . Cholecalciferol (VITAMIN D3) 1000 UNITS CAPS Take by mouth daily.    . diphenhydramine-acetaminophen (TYLENOL PM) 25-500 MG TABS tablet Take 1 tablet by mouth at bedtime as needed.    . mirtazapine (REMERON) 15 MG tablet TAKE 1 TABLET(15 MG) BY MOUTH AT BEDTIME 90 tablet 0  . polyethylene glycol powder (GLYCOLAX/MIRALAX) 17 GM/SCOOP powder Take 17 g by mouth as needed.     . Risedronate Sodium 35 MG TBEC Take 1 tablet by mouth once a week.   1  . topiramate (TOPAMAX) 100 MG tablet Take 1 tablet (100 mg total) by mouth daily. 90 tablet 1  . topiramate (TOPAMAX) 25 MG tablet 1 tablet q day x 1 week, then 2 po qd x 1 week and then 3 po q day 73 tablet 0  . doxycycline (VIBRA-TABS) 100 MG tablet Take 1 tablet (100 mg total) by mouth 2 (two) times daily. 20 tablet 0  . predniSONE (DELTASONE) 20 MG tablet Take 1 tablet (20 mg total) by mouth daily with breakfast. 5 tablet 0   No facility-administered medications prior to visit.         Objective:   Physical Exam Vitals:   11/07/20 1013  BP: 116/84  Pulse: 99  Temp: 97.6 F (36.4 C)  TempSrc: Temporal  SpO2: 97%  Weight: 127 lb (57.6 kg)  Height: 5\' 4"  (1.626 m)   Gen: Pleasant, well-nourished, in no distress,  normal affect  ENT: No lesions,  mouth clear,  oropharynx clear, no postnasal drip  Neck: No JVD, no stridor  Lungs: No use of accessory muscles, no crackles or wheezing on normal respiration, no wheeze on forced expiration  Cardiovascular: RRR, heart sounds normal, no murmur or gallops, no peripheral edema  Musculoskeletal: No deformities, no cyanosis or clubbing  Neuro: alert, awake, non focal, mild B hand tremor  Skin: Warm, no lesions or rash      Assessment & Plan:  Sarcoidosis Diagnosed over 10 years ago by  EUS and nodal biopsy, has never required therapy, has not had any other follow-up.  Her most recent CT chest 10/20/2020 shows persistent mediastinal and hilar lymphadenopathy some right upper lobe subtle groundglass change, scar.  No clear evidence for active sarcoid although she is having cough.  She needs pulmonary function testing to assess for possible obstructive disease  Chronic cough As above could relate to sarcoidosis but no other clear evidence that she has active disease.  No pulmonary function testing today to evaluate for obstructive lung disease.  We will arrange for this.  In the meantime I will empirically treat her with loratadine, omeprazole to see if treating either rhinitis, GERD impacts her cough.   Baltazar Apo, MD, PhD 11/07/2020, 1:37 PM [facility] [phone] or if no answer [phone]

## 2020-11-07 NOTE — Patient Instructions (Signed)
We will perform full pulmonary function testing at next office visit Please restart your loratadine 10 mg (Claritin) once daily until next visit Please start omeprazole 20 mg (generic Prilosec) twice a day until next visit.  Take 1 hour around food. Follow with Dr. Lamonte Sakai next available with full pulmonary function testing on the same day.

## 2020-11-07 NOTE — Assessment & Plan Note (Signed)
As above could relate to sarcoidosis but no other clear evidence that she has active disease.  No pulmonary function testing today to evaluate for obstructive lung disease.  We will arrange for this.  In the meantime I will empirically treat her with loratadine, omeprazole to see if treating either rhinitis, GERD impacts her cough.

## 2020-11-07 NOTE — Assessment & Plan Note (Signed)
Diagnosed over 10 years ago by EUS and nodal biopsy, has never required therapy, has not had any other follow-up.  Her most recent CT chest 10/20/2020 shows persistent mediastinal and hilar lymphadenopathy some right upper lobe subtle groundglass change, scar.  No clear evidence for active sarcoid although she is having cough.  She needs pulmonary function testing to assess for possible obstructive disease

## 2020-11-10 NOTE — Telephone Encounter (Signed)
Please advise on patient mychart message  Dr. Lamonte Sakai, Would it be okay for me to use Robitussin DM (the cough suppressant) to help me get through the nights? I'm taking the Claritin and the Prilosec carefully, and of course I would stop the Robitussin if my cough stops before I see you on February 1. Luevenia Maxin

## 2020-11-10 NOTE — Telephone Encounter (Signed)
Yes she can try cough suppression as she stated. You can offer her tessalon perles as well > 200mg  q6h prn if she wants to try these.

## 2020-11-10 NOTE — Telephone Encounter (Signed)
Message back from patient in regards to Tessalon  I've taken 20 days of the tessalon pearles at different times during this coughing bout (2.5 months) and I'm not sure I saw a big improvement either time, so I'll just use the robitussin and the tylenol pm for now. Thanks so much for getting back to me so quickly.

## 2020-11-11 NOTE — Telephone Encounter (Signed)
At this point, I want her talking to the pulmonologist if the cough is persisting. I don't have anything else to offer.

## 2020-11-28 NOTE — Telephone Encounter (Signed)
Called and spoke with patient on the phone. She was calling to see if she could move her COVID test for her PFT from tomorrow to today due to the threat of snow. I attempted to move her appointment but there were no more openings for today. She stated that she would send [place] a message if she was not able to complete the covid test.

## 2020-11-29 ENCOUNTER — Inpatient Hospital Stay (HOSPITAL_COMMUNITY): Admission: RE | Admit: 2020-11-29 | Payer: Medicare PPO | Source: Ambulatory Visit

## 2020-12-01 ENCOUNTER — Other Ambulatory Visit: Payer: Self-pay | Admitting: *Deleted

## 2020-12-01 ENCOUNTER — Other Ambulatory Visit (HOSPITAL_COMMUNITY)
Admission: RE | Admit: 2020-12-01 | Discharge: 2020-12-01 | Disposition: A | Discharge status: Home / Self Care | Payer: Medicare PPO | Source: Ambulatory Visit | Attending: Emergency Medicine | Admitting: Emergency Medicine

## 2020-12-01 DIAGNOSIS — Z20822 Contact with and (suspected) exposure to covid-19: Secondary | ICD-10-CM | POA: Diagnosis not present

## 2020-12-01 DIAGNOSIS — Z01812 Encounter for preprocedural laboratory examination: Secondary | ICD-10-CM | POA: Diagnosis present

## 2020-12-01 DIAGNOSIS — D869 Sarcoidosis, unspecified: Secondary | ICD-10-CM

## 2020-12-01 LAB — SARS CORONAVIRUS 2 (TAT 6-24 HRS): SARS Coronavirus 2: NEGATIVE

## 2020-12-02 ENCOUNTER — Other Ambulatory Visit: Payer: Self-pay

## 2020-12-02 ENCOUNTER — Ambulatory Visit: Payer: Medicare PPO | Admitting: Emergency Medicine

## 2020-12-02 ENCOUNTER — Telehealth: Payer: Self-pay | Admitting: Family

## 2020-12-02 ENCOUNTER — Encounter: Payer: Self-pay | Admitting: Emergency Medicine

## 2020-12-02 ENCOUNTER — Ambulatory Visit (INDEPENDENT_AMBULATORY_CARE_PROVIDER_SITE_OTHER): Payer: Medicare PPO | Admitting: Emergency Medicine

## 2020-12-02 DIAGNOSIS — R053 Chronic cough: Secondary | ICD-10-CM

## 2020-12-02 DIAGNOSIS — D869 Sarcoidosis, unspecified: Secondary | ICD-10-CM

## 2020-12-02 LAB — PULMONARY FUNCTION TEST
DL/VA % pred: 122 %
DL/VA: 5.02 ml/min/mmHg/L
DLCO cor % pred: 103 %
DLCO cor: 20.85 ml/min/mmHg
DLCO unc % pred: 103 %
DLCO unc: 20.85 ml/min/mmHg
FEF 25-75 Post: 2.45 L/sec
FEF 25-75 Pre: 2.01 L/sec
FEF2575-%Change-Post: 21 %
FEF2575-%Pred-Post: 122 %
FEF2575-%Pred-Pre: 100 %
FEV1-%Change-Post: 4 %
FEV1-%Pred-Post: 89 %
FEV1-%Pred-Pre: 85 %
FEV1-Post: 2.14 L
FEV1-Pre: 2.04 L
FEV1FVC-%Change-Post: 3 %
FEV1FVC-%Pred-Pre: 103 %
FEV6-%Change-Post: 2 %
FEV6-%Pred-Post: 86 %
FEV6-%Pred-Pre: 84 %
FEV6-Post: 2.62 L
FEV6-Pre: 2.56 L
FEV6FVC-%Pred-Post: 104 %
FEV6FVC-%Pred-Pre: 104 %
FVC-%Change-Post: 1 %
FVC-%Pred-Post: 83 %
FVC-%Pred-Pre: 81 %
FVC-Post: 2.62 L
FVC-Pre: 2.58 L
Post FEV1/FVC ratio: 82 %
Post FEV6/FVC ratio: 100 %
Pre FEV1/FVC ratio: 79 %
Pre FEV6/FVC Ratio: 100 %
RV % pred: 110 %
RV: 2.45 L
TLC % pred: 99 %
TLC: 5.16 L

## 2020-12-02 NOTE — Progress Notes (Signed)
   Subjective:    Patient ID: [name], female    DOB: 1950-11-27, 70 y.o.   MRN: [id number]  HPI  ROV 12/02/20 --follow-up visit 70 year old woman with history of allergic rhinitis, uterine fibroids, hyperlipidemia, resected malignant GIST tumor 2010 and sarcoidosis that was made by EUS biopsy 2009 (flow cytometry negative).  I met her 11/07/2020 to evaluate chronic cough.  CT chest 12/21 with multifocal mediastinal hilar lymphadenopathy some patchy right upper lobe groundglass, centrilobular emphysematous change.  She started loratadine, omeprazole to see if this would impact her cough; she took for 3 weeks.  Today she reports that her cough didn't really change. She was started on prednisone for hives on 1/28.   Pulmonary function testing done today and reviewed by me, shows grossly normal airflows without a bronchodilator response.  The flow volume loop has intermittent sawtooth pattern of the inspiratory component consistent with upper airway instability, VCD.  Her lung volumes and diffusion capacity are both normal.   Review of Systems As per HPI      Objective:   Physical Exam Vitals:   12/02/20 1333  BP: 118/70  Pulse: 100  Temp: 98 F (36.7 C)  TempSrc: Oral  SpO2: 97%  Weight: 128 lb (58.1 kg)  Height: $Remove'5\' 5"'sxMeQHY$  (1.651 m)   Gen: Pleasant, well-nourished, in no distress,  normal affect  ENT: No lesions,  mouth clear,  oropharynx clear, no postnasal drip  Neck: No JVD, no stridor  Lungs: No use of accessory muscles, no crackles or wheezing on normal respiration, no wheeze on forced expiration  Cardiovascular: RRR, heart sounds normal, no murmur or gallops, no peripheral edema  Musculoskeletal: No deformities, no cyanosis or clubbing  Neuro: alert, awake, non focal, mild B hand tremor  Skin: Warm, raised erythematous rash on arms, hands in pattern of her scratching      Assessment & Plan:  Sarcoidosis Appears to be quiescent.  Her CT chest was stable in December  and her pulmonary function testing today is normal.  No indication for bronchodilators.  We will continue to do surveillance.  Next CT chest in December.  If stable then we can space it out further.  Chronic cough Still some lingering cough although slowly progressively better.  She did not get any response to omeprazole or loratadine.  She has upper airway irritation on her inspiratory curve of her flow-volume loop.   Baltazar Apo, MD, PhD 12/02/2020, 1:51 PM [facility] [phone] or if no answer [phone]

## 2020-12-02 NOTE — Assessment & Plan Note (Signed)
Appears to be quiescent.  Her CT chest was stable in December and her pulmonary function testing today is normal.  No indication for bronchodilators.  We will continue to do surveillance.  Next CT chest in December.  If stable then we can space it out further.

## 2020-12-02 NOTE — Addendum Note (Signed)
Addended by: Gavin Potters R on: 12/02/2020 02:05 PM   Modules accepted: Orders

## 2020-12-02 NOTE — Patient Instructions (Signed)
Your pulmonary function testing is normal with the exception of some evidence for some throat and upper airway irritation.  This is good news. We will not start any inhaled medications at this time. Recommend you consider seeing dermatology if your rash does not resolve with the prednisone that has been already ordered.  Your rash does not look consistent with sarcoidosis. We will repeat your CT scan of the chest without contrast in December 2022. Follow Dr. Lamonte Sakai in December to review your CT scan or sooner if you have any problems.

## 2020-12-02 NOTE — Progress Notes (Signed)
Full PFT performed today. °

## 2020-12-02 NOTE — Assessment & Plan Note (Signed)
Still some lingering cough although slowly progressively better.  She did not get any response to omeprazole or loratadine.  She has upper airway irritation on her inspiratory curve of her flow-volume loop.

## 2020-12-02 NOTE — Telephone Encounter (Signed)
LVM for pt to rtn my call to schedule awv with nha. Please schedule awv if pt calls the office.  

## 2020-12-24 NOTE — Telephone Encounter (Signed)
I looked again at her PFT.  Her airflows are normal without a bronchodilator response.  She does have evidence for upper airway and throat irritation on her flow volume loop.  I could see the percent predicted, available in my data set, so not sure why she could not see this.  All of her numbers fallen to the normal range.

## 2020-12-24 NOTE — Telephone Encounter (Signed)
Dr Lamonte Sakai, please advise on pt email, thanks!    I received my lung function test results, but the column for standard range is blank. I was just curious. I'm still coughing. I've begun taking the Rx for Prilosec and Claritin again, just to give them a longer time than the three weeks I did in January. Hoping for the best. [name]

## 2021-01-13 ENCOUNTER — Other Ambulatory Visit: Payer: Self-pay | Admitting: Family

## 2021-01-13 DIAGNOSIS — F411 Generalized anxiety disorder: Secondary | ICD-10-CM

## 2021-01-13 DIAGNOSIS — R5383 Other fatigue: Secondary | ICD-10-CM

## 2021-02-09 NOTE — Progress Notes (Signed)
Assessment/Plan:    1.  Essential Tremor  -pt back on topamax and she thinks that it is helping.  She would like to slowly increase the medication 100 mg bid.  R/B/SE were discussed, including but not limited to word finding trouble.  The opportunity to ask questions was given and they were answered to the best of my ability.  The patient expressed understanding and willingness to follow the outlined treatment protocols.  -future options:gabapentin or zonisamide.  Subjective:   [name] was seen today in follow up for essential tremor.  My previous records were reviewed prior to todays visit.  We decided to cautiously start her on topiramate last visit for tremor and work up to 100 mg daily.  She called me about 6 weeks later stating that she felt that she was having side effects from medication including cough, fatigue, loss of appetite, depression and wanted to get off of the medication.  I told her I was not sure that all of these side effects were from Topamax (certainly loss of appetite could be), but we decided to go ahead and wean off of the medication.   She states that she continued to cough and still has cough so she titrated back on it and she thinks that it is helping without SE.  She tried reflux and allergy med without relief.      PREVIOUS MEDICATIONS: Primidone (150 mg twice a day); propranolol; topiramate  ALLERGIES:   Allergies  Allergen Reactions  . Augmentin [Amoxicillin-Pot Clavulanate] Other (See Comments)    Diarrhea  . Azithromycin Diarrhea  . Codeine Nausea Only    REACTION: nausea  . Hydrocodone-Acetaminophen Nausea Only  . Morphine And Related Nausea Only    Intense nausea  . Primidone     CURRENT MEDICATIONS:  Outpatient Encounter Medications as of 02/10/2021  Medication Sig  . Cholecalciferol (VITAMIN D3) 1000 UNITS CAPS Take by mouth daily.  . diphenhydramine-acetaminophen (TYLENOL PM) 25-500 MG TABS tablet Take 1 tablet by mouth at bedtime  as needed.  . mirtazapine (REMERON) 15 MG tablet TAKE 1 TABLET(15 MG) BY MOUTH AT BEDTIME  . polyethylene glycol powder (GLYCOLAX/MIRALAX) 17 GM/SCOOP powder Take 17 g by mouth as needed.   . Risedronate Sodium 35 MG TBEC Take 1 tablet by mouth once a week.   . topiramate (TOPAMAX) 100 MG tablet Take 1 tablet (100 mg total) by mouth daily.  . [DISCONTINUED] benzonatate (TESSALON) 200 MG capsule Take 1 capsule (200 mg total) by mouth 3 (three) times daily as needed. (Patient not taking: Reported on 12/02/2020)  . [DISCONTINUED] omeprazole (PRILOSEC) 20 MG capsule Take 1 capsule (20 mg total) by mouth 2 (two) times daily before a meal. (Patient not taking: Reported on 12/02/2020)  . [DISCONTINUED] predniSONE (DELTASONE) 20 MG tablet 2 pills for 3 days, 1 pills for 3 days, Do not take with NSAID'S.  Take in the am with food  . [DISCONTINUED] topiramate (TOPAMAX) 25 MG tablet 1 tablet q day x 1 week, then 2 po qd x 1 week and then 3 po q day (Patient not taking: Reported on 12/02/2020)   No facility-administered encounter medications on file as of 02/10/2021.     Objective:    PHYSICAL EXAMINATION:    VITALS:   Vitals:   02/10/21 1457  BP: 136/70  Pulse: 84  SpO2: 97%  Weight: 124 lb (56.2 kg)  Height: 5\' 4"  (1.626 m)    GEN:  The patient appears stated age and  is in NAD. HEENT:  Normocephalic, atraumatic.  The mucous membranes are moist. The superficial temporal arteries are without ropiness or tenderness. CV:  RRR Lungs:  CTAB Neck/HEME:  There are no carotid bruits bilaterally.  Neurological examination:  Orientation: The patient is alert and oriented x3. Cranial nerves: There is good facial symmetry. The speech is fluent and clear. Soft palate rises symmetrically and there is no tongue deviation. Hearing is intact to conversational tone. Sensation: Sensation is intact to light touch throughout Motor: Strength is at least antigravity x4.  Movement examination: Tone: There is normal  tone in the UE/LE Abnormal movements: she has mod postural tremor on the R and mild to mod on the L.  Trouble getting pen to paper for archimedes spirals but does better than in the past Coordination:  There is no decremation with RAM's Gait and Station: The patient has no difficulty arising out of a deep-seated chair without the use of the hands. The patient's stride length is good I have reviewed and interpreted the following labs independently   Chemistry      Component Value Date/Time   NA 138 10/17/2020 1125   K 3.9 10/17/2020 1125   CL 104 10/17/2020 1125   CO2 29 10/17/2020 1125   BUN 13 10/17/2020 1125   CREATININE 0.80 10/17/2020 1125      Component Value Date/Time   CALCIUM 9.6 10/17/2020 1125   ALKPHOS 54 10/17/2020 1125   AST 18 10/17/2020 1125   ALT 15 10/17/2020 1125   BILITOT 0.5 10/17/2020 1125      Lab Results  Component Value Date   WBC 6.7 10/17/2020   HGB 15.4 (H) 10/17/2020   HCT 45.8 10/17/2020   MCV 91.4 10/17/2020   PLT 295.0 10/17/2020   Lab Results  Component Value Date   TSH 2.67 10/28/2015     Chemistry      Component Value Date/Time   NA 138 10/17/2020 1125   K 3.9 10/17/2020 1125   CL 104 10/17/2020 1125   CO2 29 10/17/2020 1125   BUN 13 10/17/2020 1125   CREATININE 0.80 10/17/2020 1125      Component Value Date/Time   CALCIUM 9.6 10/17/2020 1125   ALKPHOS 54 10/17/2020 1125   AST 18 10/17/2020 1125   ALT 15 10/17/2020 1125   BILITOT 0.5 10/17/2020 1125         Total time spent on today's visit was 20 minutes, including both face-to-face time and nonface-to-face time.  Time included that spent on review of records (prior notes available to me/labs/imaging if pertinent), discussing treatment and goals, answering patient's questions and coordinating care.  Cc:  Marrian Salvage, FNP

## 2021-02-10 ENCOUNTER — Ambulatory Visit: Payer: Medicare PPO | Admitting: Neurology

## 2021-02-10 ENCOUNTER — Other Ambulatory Visit: Payer: Self-pay

## 2021-02-10 ENCOUNTER — Encounter: Payer: Self-pay | Admitting: Neurology

## 2021-02-10 VITALS — BP 136/70 | HR 84 | Ht 64.0 in | Wt 124.0 lb

## 2021-02-10 DIAGNOSIS — G250 Essential tremor: Secondary | ICD-10-CM | POA: Diagnosis not present

## 2021-02-10 MED ORDER — TOPIRAMATE 100 MG PO TABS: 100.0000 mg | ORAL_TABLET | Freq: Two times a day (BID) | ORAL | 1 refills | Status: DC

## 2021-02-10 NOTE — Patient Instructions (Signed)
Week 1 and 2: Take topamax, 100mg , 1/2 tablet in the AM and 1 at bed  Week 3 and beyond:  Take topamax:  100 mg twice per day   The physicians and staff at [facility] are committed to providing excellent care. You may receive a survey requesting feedback about your experience at our office. We strive to receive "very good" responses to the survey questions. If you feel that your experience would prevent you from giving the office a "very good " response, please contact our office to try to remedy the situation. We may be reached at [phone]. Thank you for taking the time out of your busy day to complete the survey.

## 2021-08-12 NOTE — Progress Notes (Signed)
Assessment/Plan:    1.  Essential Tremor  -pt back on topamax and she thinks that it is helping.  She would like to continue on the medication.  It is not causing cognitive deficits.  She will continue on Topamax, 50 mg in the morning and 100 mg at bed  -future options:gabapentin or zonisamide.  2.  F/u 1 year  Subjective:   [name] was seen today in follow up for essential tremor.  My previous records were reviewed prior to todays visit.  Last visit, patient thought Topamax was helping and wanted to slightly increase the medication, which we did.  She states that she went to 100 mg bid made her sleepy but she went to 50 mg in the AM, 100 mg in PM and she thinks that she is doing really well.  "We have found the sweet spot."  Current movement disorder medications: Topamax, 100 mg twice per day (increased)  PREVIOUS MEDICATIONS: Primidone (150 mg twice a day); propranolol; topiramate  ALLERGIES:   Allergies  Allergen Reactions   Augmentin [Amoxicillin-Pot Clavulanate] Other (See Comments)    Diarrhea   Azithromycin Diarrhea   Codeine Nausea Only    REACTION: nausea   Hydrocodone-Acetaminophen Nausea Only   Morphine And Related Nausea Only    Intense nausea   Primidone     CURRENT MEDICATIONS:  Outpatient Encounter Medications as of 08/14/2021  Medication Sig   Cholecalciferol (VITAMIN D3) 1000 UNITS CAPS Take by mouth daily.   diphenhydramine-acetaminophen (TYLENOL PM) 25-500 MG TABS tablet Take 1 tablet by mouth at bedtime as needed.   mirtazapine (REMERON) 15 MG tablet TAKE 1 TABLET(15 MG) BY MOUTH AT BEDTIME   polyethylene glycol powder (GLYCOLAX/MIRALAX) 17 GM/SCOOP powder Take 17 g by mouth as needed.    Risedronate Sodium 35 MG TBEC Take 1 tablet by mouth once a week.    topiramate (TOPAMAX) 100 MG tablet Take 1 tablet (100 mg total) by mouth 2 (two) times daily.   No facility-administered encounter medications on file as of 08/14/2021.     Objective:     PHYSICAL EXAMINATION:    VITALS:   Vitals:   08/14/21 1047  BP: 125/82  Pulse: 84  SpO2: 99%  Weight: 123 lb 12.8 oz (56.2 kg)  Height: 5\' 4"  (1.626 m)     GEN:  The patient appears stated age and is in NAD. HEENT:  Normocephalic, atraumatic.  The mucous membranes are moist. The superficial temporal arteries are without ropiness or tenderness. CV:  RRR Lungs:  CTAB Neck/HEME:  There are no carotid bruits bilaterally.  Neurological examination:  Orientation: The patient is alert and oriented x3. Cranial nerves: There is good facial symmetry. The speech is fluent and clear. Soft palate rises symmetrically and there is no tongue deviation. Hearing is intact to conversational tone. Sensation: Sensation is intact to light touch throughout Motor: Strength is at least antigravity x4.  Movement examination: Tone: There is normal tone in the UE/LE Abnormal movements: she still has moderate postural tremor on the right and mild on the left.  She has some left leg tremor when she activates it a little bit (she was holding the computer tablet on her lap).  Archimedes spirals are drawn a little bit better.  Has mod postural tremor on the R and mild to mod on the L.  Trouble getting pen to paper for archimedes spirals but does better than in the past Coordination:  There is no decremation with RAM's Gait  and Station: The patient has no difficulty arising out of a deep-seated chair without the use of the hands. The patient's stride length is good I have reviewed and interpreted the following labs independently   Chemistry      Component Value Date/Time   NA 138 10/17/2020 1125   K 3.9 10/17/2020 1125   CL 104 10/17/2020 1125   CO2 29 10/17/2020 1125   BUN 13 10/17/2020 1125   CREATININE 0.80 10/17/2020 1125      Component Value Date/Time   CALCIUM 9.6 10/17/2020 1125   ALKPHOS 54 10/17/2020 1125   AST 18 10/17/2020 1125   ALT 15 10/17/2020 1125   BILITOT 0.5 10/17/2020 1125       Lab Results  Component Value Date   WBC 6.7 10/17/2020   HGB 15.4 (H) 10/17/2020   HCT 45.8 10/17/2020   MCV 91.4 10/17/2020   PLT 295.0 10/17/2020   Lab Results  Component Value Date   TSH 2.67 10/28/2015     Chemistry      Component Value Date/Time   NA 138 10/17/2020 1125   K 3.9 10/17/2020 1125   CL 104 10/17/2020 1125   CO2 29 10/17/2020 1125   BUN 13 10/17/2020 1125   CREATININE 0.80 10/17/2020 1125      Component Value Date/Time   CALCIUM 9.6 10/17/2020 1125   ALKPHOS 54 10/17/2020 1125   AST 18 10/17/2020 1125   ALT 15 10/17/2020 1125   BILITOT 0.5 10/17/2020 1125         Cc:  Pcp, No

## 2021-08-14 ENCOUNTER — Ambulatory Visit: Payer: Medicare PPO | Admitting: Neurology

## 2021-08-14 ENCOUNTER — Encounter: Payer: Self-pay | Admitting: Neurology

## 2021-08-14 ENCOUNTER — Other Ambulatory Visit: Payer: Self-pay

## 2021-08-14 VITALS — BP 125/82 | HR 84 | Ht 64.0 in | Wt 123.8 lb

## 2021-08-14 DIAGNOSIS — G250 Essential tremor: Secondary | ICD-10-CM | POA: Diagnosis not present

## 2021-08-14 NOTE — Patient Instructions (Signed)
Essential Tremor A tremor is trembling or shaking that a person cannot control. Most tremors affect the hands or arms. Tremors can also affect the head, vocal cords, legs, and other parts of the body. Essential tremor is a tremor without a known cause. Usually, it occurs while a person is trying to perform an action. Ittends to get worse gradually as a person ages. What are the causes? The cause of this condition is not known. What increases the risk? You are more likely to develop this condition if: You have a family member with essential tremor. You are age 40 or older. You take certain medicines. What are the signs or symptoms? The main sign of a tremor is a rhythmic shaking of certain parts of your body that is uncontrolled and unintentional. You may: Have difficulty eating with a spoon or fork. Have difficulty writing. Nod your head up and down or side to side. Have a quivering voice. The shaking may: Get worse over time. Come and go. Be more noticeable on one side of your body. Get worse due to stress, fatigue, caffeine, and extreme heat or cold. How is this diagnosed? This condition may be diagnosed based on: Your symptoms and medical history. A physical exam. There is no single test to diagnose an essential tremor. However, your health care provider may order tests to rule out other causes of your condition. These may include: Blood and urine tests. Imaging studies of your brain, such as CT scan and MRI. A test that measures involuntary muscle movement (electromyogram). How is this treated? Treatment for essential tremor depends on the severity of the condition. Some tremors may go away without treatment. Mild tremors may not need treatment if they do not affect your day-to-day life. Severe tremors may need to be treated using one or more of the following options: Medicines. Lifestyle changes. Occupational or physical therapy. Follow these instructions at  home: Lifestyle  Do not use any products that contain nicotine or tobacco, such as cigarettes and e-cigarettes. If you need help quitting, ask your health care provider. Limit your caffeine intake as told by your health care provider. Try to get 8 hours of sleep each night. Find ways to manage your stress that fits your lifestyle and personality. Consider trying meditation or yoga. Try to anticipate stressful situations and allow extra time to manage them. If you are struggling emotionally with the effects of your tremor, consider working with a mental health provider.  General instructions Take over-the-counter and prescription medicines only as told by your health care provider. Avoid extreme heat and extreme cold. Keep all follow-up visits as told by your health care provider. This is important. Visits may include physical therapy visits. Contact a health care provider if: You experience any changes in the location or intensity of your tremors. You start having a tremor after starting a new medicine. You have tremor with other symptoms, such as: Numbness. Tingling. Pain. Weakness. Your tremor gets worse. Your tremor interferes with your daily life. You feel down, blue, or sad for at least 2 weeks in a row. Worrying about your tremor and what other people think about you interferes with your everyday life functions, including relationships, work, or school. Summary Essential tremor is a tremor without a known cause. Usually, it occurs when you are trying to perform an action. You are more likely to develop this condition if you have a family member with essential tremor. The main sign of a tremor is a rhythmic shaking of   certain parts of your body that is uncontrolled and unintentional. Treatment for essential tremor depends on the severity of the condition. This information is not intended to replace advice given to you by your health care provider. Make sure you discuss any  questions you have with your healthcare provider. Document Revised: 07/11/2020 Document Reviewed: 07/11/2020 Elsevier Patient Education  2022 [employer]  

## 2021-08-26 ENCOUNTER — Other Ambulatory Visit: Payer: Self-pay | Admitting: Neurology

## 2021-08-26 DIAGNOSIS — G250 Essential tremor: Secondary | ICD-10-CM

## 2021-10-16 ENCOUNTER — Ambulatory Visit (INDEPENDENT_AMBULATORY_CARE_PROVIDER_SITE_OTHER)
Admission: RE | Admit: 2021-10-16 | Discharge: 2021-10-16 | Disposition: A | Discharge status: Home / Self Care | Payer: Medicare PPO | Source: Ambulatory Visit | Attending: Emergency Medicine | Admitting: Emergency Medicine

## 2021-10-16 ENCOUNTER — Other Ambulatory Visit: Payer: Self-pay

## 2021-10-16 DIAGNOSIS — D869 Sarcoidosis, unspecified: Secondary | ICD-10-CM | POA: Diagnosis not present

## 2021-11-05 ENCOUNTER — Ambulatory Visit: Payer: Medicare PPO | Admitting: Nurse Practitioner

## 2021-11-05 ENCOUNTER — Other Ambulatory Visit: Payer: Self-pay

## 2021-11-05 ENCOUNTER — Encounter: Payer: Self-pay | Admitting: Nurse Practitioner

## 2021-11-05 VITALS — BP 130/86 | HR 90 | Temp 98.1°F | Ht 64.0 in | Wt 125.8 lb

## 2021-11-05 DIAGNOSIS — F419 Anxiety disorder, unspecified: Secondary | ICD-10-CM

## 2021-11-05 DIAGNOSIS — G250 Essential tremor: Secondary | ICD-10-CM

## 2021-11-05 DIAGNOSIS — E782 Mixed hyperlipidemia: Secondary | ICD-10-CM

## 2021-11-05 DIAGNOSIS — Z131 Encounter for screening for diabetes mellitus: Secondary | ICD-10-CM

## 2021-11-05 MED ORDER — TOPIRAMATE 100 MG PO TABS: ORAL_TABLET | ORAL | 1 refills | Status: DC

## 2021-11-05 NOTE — Assessment & Plan Note (Signed)
We will check blood work today for further evaluation.

## 2021-11-05 NOTE — Assessment & Plan Note (Signed)
Had fairly long discussion regarding increasing dose of Remeron.  For now she continue on her current dose of 15 mg by mouth daily.  I did tell her that if her kidney function test remains stable she could consider increasing the dose to 30 mg of Remeron at night.  If she does this and tolerates it well we could increase the dose in the future.  We will discuss this further at next office visit.

## 2021-11-05 NOTE — Progress Notes (Signed)
Subjective:  Patient ID: [name], female    DOB: 1951/07/23  Age: 71 y.o. MRN: [id number]  CC:  Chief Complaint  Patient presents with   Transitions Of Care           HPI  This patient arrives today for the above.  Here to transition care to myself. She is not new to the practice, but new to myself. Overall feels well. Plans on getting covid booster later today. Wondering if she can increase remeron due to insomnia and situational anxiety/depression.  Denies suicidal ideation.  Tells me that main stressor in her life is taking care of her mother whom she is main caregiver for.  She tells me she has support through her daughter and grandson (they live locally) and enjoys spending time with them when she can.  She is due for blood work.  She does see neurology for essential tremor and tells me that she did recently reduce her topiramate to 1/2 tablet in the morning and 1 tablet in the evening.  Tells me neurology is aware and agreeable to this medication change.   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 Pt has 1 dtr. 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   diphenhydramine-acetaminophen (TYLENOL PM) 25-500 MG TABS tablet Take 1 tablet by mouth at bedtime as needed.   mirtazapine (REMERON) 15 MG tablet TAKE 1 TABLET(15 MG) BY MOUTH AT BEDTIME   polyethylene glycol powder (GLYCOLAX/MIRALAX) 17 GM/SCOOP powder Take 17 g by mouth as needed.    Risedronate Sodium 35 MG TBEC Take 1 tablet by mouth once a week.    [DISCONTINUED] topiramate (TOPAMAX) 100 MG tablet TAKE 1 TABLET(100 MG) BY MOUTH TWICE DAILY    ROS:  Review of Systems  Constitutional:  Negative for fever.  HENT:  Positive for congestion. Negative for sore throat.   Respiratory:  Negative for cough.   Cardiovascular:  Negative for chest pain.  Psychiatric/Behavioral:  Positive for depression. Negative for hallucinations and suicidal ideas. The patient is nervous/anxious and has insomnia.     Objective:   Today's Vitals: BP 130/86 (BP Location: Left Arm, Patient Position: Sitting, Cuff Size: Normal)    Pulse 90    Temp 98.1 F (36.7 C) (Oral)    Ht 5\' 4"  (1.626 m)    Wt 125 lb 12.8 oz (57.1 kg)    SpO2 99%    BMI 21.59 kg/m  Vitals with BMI 11/05/2021 08/14/2021 02/10/2021  Height 5\' 4"  5\' 4"  5\' 4"   Weight 125 lbs 13 oz 123 lbs 13 oz 124 lbs  BMI 21.58 41.32 44.01  Systolic 027 253 664  Diastolic 86 82 70  Pulse 90 84 84     Physical Exam Vitals reviewed.  Constitutional:      General: She is not in acute distress.    Appearance: Normal appearance.  HENT:     Head: Normocephalic and atraumatic.  Neck:     Vascular: No carotid bruit.  Cardiovascular:      Rate and Rhythm: Normal rate and regular rhythm.     Pulses: Normal pulses.     Heart sounds: Normal heart sounds.  Pulmonary:     Effort: Pulmonary effort is normal.     Breath sounds: Normal breath sounds.  Skin:    General: Skin is warm and dry.  Neurological:     General: No focal deficit present.     Mental Status: She is alert and oriented to person, place, and time.  Psychiatric:        Mood and Affect: Mood normal.        Behavior: Behavior normal.        Judgment: Judgment normal.         Assessment and Plan   1. Screening for diabetes mellitus   2. Essential tremor   3. Anxiety   4. Mixed hyperlipidemia          Tests ordered Orders Placed This Encounter  Procedures   TSH   Hemoglobin A1c   Lipid panel   Comprehensive metabolic panel   CBC with Differential/Platelet      Meds ordered this encounter  Medications   topiramate (TOPAMAX) 100 MG tablet    Sig: Take 50mg  by mouth every morning and 100mg  by mouth every evening    Dispense:  180 tablet    Refill:  1    Order Specific Question:   Supervising Provider    Answer:   Binnie Rail [[id number]]    Patient to follow-up in 1 to 3 months, or sooner as needed.  Ailene Ards, NP

## 2021-11-05 NOTE — Assessment & Plan Note (Signed)
Continue on current dose of topiramate and follow-up with neurology as scheduled.

## 2021-11-05 NOTE — Assessment & Plan Note (Signed)
We will check blood work for further evaluation today.

## 2021-11-06 ENCOUNTER — Other Ambulatory Visit (INDEPENDENT_AMBULATORY_CARE_PROVIDER_SITE_OTHER): Payer: Medicare PPO

## 2021-11-06 DIAGNOSIS — F419 Anxiety disorder, unspecified: Secondary | ICD-10-CM | POA: Diagnosis not present

## 2021-11-06 DIAGNOSIS — G250 Essential tremor: Secondary | ICD-10-CM | POA: Diagnosis not present

## 2021-11-06 DIAGNOSIS — Z131 Encounter for screening for diabetes mellitus: Secondary | ICD-10-CM | POA: Diagnosis not present

## 2021-11-06 DIAGNOSIS — E782 Mixed hyperlipidemia: Secondary | ICD-10-CM | POA: Diagnosis not present

## 2021-11-06 LAB — COMPREHENSIVE METABOLIC PANEL
ALT: 20 U/L (ref 0–35)
AST: 18 U/L (ref 0–37)
Albumin: 4 g/dL (ref 3.5–5.2)
Alkaline Phosphatase: 48 U/L (ref 39–117)
BUN: 16 mg/dL (ref 6–23)
CO2: 26 mEq/L (ref 19–32)
Calcium: 9.2 mg/dL (ref 8.4–10.5)
Chloride: 107 mEq/L (ref 96–112)
Creatinine, Ser: 0.85 mg/dL (ref 0.40–1.20)
GFR: 69.4 mL/min (ref >60.00)
Glucose, Bld: 91 mg/dL (ref 70–99)
Potassium: 4 mEq/L (ref 3.5–5.1)
Sodium: 140 mEq/L (ref 135–145)
Total Bilirubin: 0.5 mg/dL (ref 0.2–1.2)
Total Protein: 7 g/dL (ref 6.0–8.3)

## 2021-11-06 LAB — CBC WITH DIFFERENTIAL/PLATELET
Basophils Absolute: 0.1 10*3/uL (ref 0.0–0.1)
Basophils Relative: 0.8 % (ref 0.0–3.0)
Eosinophils Absolute: 0.4 10*3/uL (ref 0.0–0.7)
Eosinophils Relative: 5.5 % — ABNORMAL HIGH (ref 0.0–5.0)
HCT: 44.4 % (ref 36.0–46.0)
Hemoglobin: 14.6 g/dL (ref 12.0–15.0)
Lymphocytes Relative: 27.5 % (ref 12.0–46.0)
Lymphs Abs: 2.2 10*3/uL (ref 0.7–4.0)
MCHC: 33 g/dL (ref 30.0–36.0)
MCV: 91.8 fl (ref 78.0–100.0)
Monocytes Absolute: 0.6 10*3/uL (ref 0.1–1.0)
Monocytes Relative: 7.2 % (ref 3.0–12.0)
Neutro Abs: 4.7 10*3/uL (ref 1.4–7.7)
Neutrophils Relative %: 59 % (ref 43.0–77.0)
Platelets: 272 10*3/uL (ref 150.0–400.0)
RBC: 4.83 Mil/uL (ref 3.87–5.11)
RDW: 13.9 % (ref 11.5–15.5)
WBC: 8 10*3/uL (ref 4.0–10.5)

## 2021-11-06 LAB — LIPID PANEL
Cholesterol: 246 mg/dL — ABNORMAL HIGH (ref 0–200)
HDL: 37.8 mg/dL — ABNORMAL LOW (ref >39.00)
NonHDL: 208.64
Total CHOL/HDL Ratio: 7
Triglycerides: 248 mg/dL — ABNORMAL HIGH (ref 0.0–149.0)
VLDL: 49.6 mg/dL — ABNORMAL HIGH (ref 0.0–40.0)

## 2021-11-06 LAB — TSH: TSH: 2.93 u[IU]/mL (ref 0.35–5.50)

## 2021-11-06 LAB — LDL CHOLESTEROL, DIRECT: Direct LDL: 167 mg/dL

## 2021-11-06 LAB — HEMOGLOBIN A1C: Hgb A1c MFr Bld: 5.5 % (ref 4.6–6.5)

## 2021-12-11 ENCOUNTER — Ambulatory Visit: Payer: Medicare PPO | Admitting: Nurse Practitioner

## 2021-12-11 ENCOUNTER — Encounter: Payer: Self-pay | Admitting: Nurse Practitioner

## 2021-12-11 ENCOUNTER — Other Ambulatory Visit: Payer: Self-pay

## 2021-12-11 VITALS — BP 128/80 | HR 80 | Temp 98.1°F | Ht 64.0 in | Wt 124.2 lb

## 2021-12-11 DIAGNOSIS — E785 Hyperlipidemia, unspecified: Secondary | ICD-10-CM

## 2021-12-11 DIAGNOSIS — F411 Generalized anxiety disorder: Secondary | ICD-10-CM | POA: Diagnosis not present

## 2021-12-11 DIAGNOSIS — R5383 Other fatigue: Secondary | ICD-10-CM | POA: Diagnosis not present

## 2021-12-11 DIAGNOSIS — I491 Atrial premature depolarization: Secondary | ICD-10-CM

## 2021-12-11 DIAGNOSIS — N289 Disorder of kidney and ureter, unspecified: Secondary | ICD-10-CM

## 2021-12-11 MED ORDER — MIRTAZAPINE 15 MG PO TABS: ORAL_TABLET | ORAL | 3 refills | Status: DC

## 2021-12-11 NOTE — Progress Notes (Signed)
Subjective:  Patient ID: [name], female    DOB: 07/01/1951  Age: 71 y.o. MRN: [id number]  CC:  Chief Complaint  Patient presents with   Follow-up    No concerns      HPI  This patient arrives today for the above.  She is here to discuss her blood work.  Blood work did show mildly reduced GFR, hyperlipidemia with ASCVD risk of approximately 11.2.  She is not currently on any statin therapy but tells me she has been on it in the past and has tolerated it well.  She tries to live a healthy, active lifestyle.  She also does have anxiety and is treated with Remeron.  She has been taking 15 mg by mouth every night and will sometimes take an additional tablet if needed.  This seems to help control her mood well.  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 Pt has 1 dtr. 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   diphenhydramine-acetaminophen (TYLENOL PM) 25-500 MG TABS tablet Take 1 tablet by mouth at bedtime as needed.   polyethylene glycol powder (GLYCOLAX/MIRALAX) 17 GM/SCOOP powder Take 17 g by mouth as needed.    Risedronate Sodium 35 MG TBEC Take 1 tablet by mouth once a week.    topiramate (TOPAMAX) 100 MG tablet Take 50mg  by mouth every morning and 100mg  by mouth every evening   [DISCONTINUED] mirtazapine (REMERON) 15 MG tablet TAKE 1 TABLET(15 MG) BY MOUTH AT BEDTIME    ROS:  Review of Systems  Constitutional:  Negative for fever.  Respiratory:  Negative for shortness of breath.   Cardiovascular:  Negative for chest pain.  Neurological:  Negative for dizziness.    Objective:   Today's Vitals: BP 128/80 (BP Location: Left Arm, Patient Position: Sitting, Cuff Size: Normal)    Pulse 80    Temp 98.1 F (36.7 C) (Oral)    Ht 5\' 4"  (1.626 m)  Wt 124 lb 3.2 oz (56.3 kg)    SpO2 99%    BMI 21.32 kg/m  Vitals with BMI 12/11/2021 11/05/2021 08/14/2021  Height 5\' 4"  5\' 4"  5\' 4"   Weight 124 lbs 3 oz 125 lbs 13 oz 123 lbs 13 oz  BMI 21.31 10.17 51.02  Systolic 585 277 824  Diastolic 80 86 82  Pulse 80 90 84     Physical Exam Vitals reviewed.  Constitutional:      General: She is not in acute distress.    Appearance: Normal appearance.  HENT:     Head: Normocephalic and atraumatic.  Neck:     Vascular: No carotid bruit.  Cardiovascular:     Rate and Rhythm: Normal rate. Rhythm irregular.     Pulses: Normal pulses.     Heart sounds: Normal heart sounds.  Pulmonary:     Effort: Pulmonary effort is normal.     Breath sounds: Normal breath sounds.  Skin:    General: Skin is warm and dry.  Neurological:     General: No focal deficit present.     Mental Status: She is alert and oriented to person, place, and time.   Psychiatric:        Mood and Affect: Mood normal.        Behavior: Behavior normal.        Judgment: Judgment normal.      EKG: Normal sinus rhythm with PACs   Assessment and Plan   1. Hyperlipidemia, unspecified hyperlipidemia type   2. Caregiver with fatigue   3. Anxiety state   4. Irregular heart rhythm   5. Kidney insufficiency      Plan: 1.  We had a long discussion regarding risks and benefits of statin therapy.  For now patient would like to hold off on statin therapy, but will focus on healthy lifestyle.  She would like to recheck cholesterol panel in the future and monitor ASCVD risk score and may consider statin therapy based on these.  Per shared decision making she will return in 6 months to repeat lipid panel and recheck ASCVD risk score. 2.,  3.  She will continue taking her Remeron as currently prescribed. 4. PACs seen on EKG. No additional work-up recommended as patient is asymptomatic. 5.  Discussed her reduced GFR, and that we will plan on rechecking metabolic panel and urine to monitor urine creatinine ratio at next office visit to see if she does have a diagnosis of chronic kidney disease.  In the meantime she will continue to try to live a healthy lifestyle by eating a healthy diet and exercising regularly.  Further recommendations may be made based upon these results as well.   Tests ordered Orders Placed This Encounter  Procedures   EKG 12-Lead      Meds ordered this encounter  Medications   mirtazapine (REMERON) 15 MG tablet    Sig: Take 1-2 tablets by mouth every evening    Dispense:  90 tablet    Refill:  3    Order Specific Question:   Supervising Provider    Answer:   Binnie Rail [[id number]]    Patient to follow-up in 6 months, or sooner as needed.  Ailene Ards, NP

## 2022-01-08 ENCOUNTER — Other Ambulatory Visit: Payer: Self-pay | Admitting: Family

## 2022-01-08 DIAGNOSIS — R5383 Other fatigue: Secondary | ICD-10-CM

## 2022-01-08 DIAGNOSIS — F411 Generalized anxiety disorder: Secondary | ICD-10-CM

## 2022-01-12 ENCOUNTER — Telehealth: Payer: Self-pay

## 2022-01-12 DIAGNOSIS — F411 Generalized anxiety disorder: Secondary | ICD-10-CM

## 2022-01-12 DIAGNOSIS — R5383 Other fatigue: Secondary | ICD-10-CM

## 2022-01-12 MED ORDER — MIRTAZAPINE 15 MG PO TABS: ORAL_TABLET | ORAL | 3 refills | Status: DC

## 2022-01-12 NOTE — Telephone Encounter (Signed)
last rx did not go through "no print" - Rx sent ?

## 2022-01-12 NOTE — Telephone Encounter (Signed)
Pt is requesting a refill on: ?mirtazapine (REMERON) 15 MG tablet ? ?Pharmacy: ?[facility] [place] [place] [address] ? ?LOV 12/11/21 ?

## 2022-05-24 ENCOUNTER — Other Ambulatory Visit: Payer: Self-pay | Admitting: Neurology

## 2022-05-24 DIAGNOSIS — G250 Essential tremor: Secondary | ICD-10-CM

## 2022-06-03 ENCOUNTER — Other Ambulatory Visit (INDEPENDENT_AMBULATORY_CARE_PROVIDER_SITE_OTHER): Payer: Medicare PPO

## 2022-06-03 DIAGNOSIS — E785 Hyperlipidemia, unspecified: Secondary | ICD-10-CM

## 2022-06-03 DIAGNOSIS — N289 Disorder of kidney and ureter, unspecified: Secondary | ICD-10-CM

## 2022-06-03 LAB — BASIC METABOLIC PANEL
BUN: 14 mg/dL (ref 6–23)
CO2: 26 mEq/L (ref 19–32)
Calcium: 9.5 mg/dL (ref 8.4–10.5)
Chloride: 106 mEq/L (ref 96–112)
Creatinine, Ser: 0.84 mg/dL (ref 0.40–1.20)
GFR: 70.11 mL/min (ref >60.00)
Glucose, Bld: 85 mg/dL (ref 70–99)
Potassium: 4 mEq/L (ref 3.5–5.1)
Sodium: 140 mEq/L (ref 135–145)

## 2022-06-03 LAB — MICROALBUMIN / CREATININE URINE RATIO
Creatinine,U: 52.7 mg/dL
Microalb Creat Ratio: 1.3 mg/g (ref 0.0–30.0)
Microalb, Ur: <0.7 mg/dL (ref 0.0–1.9)

## 2022-06-03 LAB — LIPID PANEL
Cholesterol: 261 mg/dL — ABNORMAL HIGH (ref 0–200)
HDL: 39.8 mg/dL (ref >39.00)
LDL Cholesterol: 183 mg/dL — ABNORMAL HIGH (ref 0–99)
NonHDL: 220.82
Total CHOL/HDL Ratio: 7
Triglycerides: 188 mg/dL — ABNORMAL HIGH (ref 0.0–149.0)
VLDL: 37.6 mg/dL (ref 0.0–40.0)

## 2022-06-10 ENCOUNTER — Other Ambulatory Visit: Payer: Medicare PPO

## 2022-06-10 ENCOUNTER — Ambulatory Visit: Payer: Medicare PPO | Admitting: Nurse Practitioner

## 2022-06-10 VITALS — BP 122/68 | HR 78 | Temp 97.8°F | Ht 64.0 in | Wt 124.0 lb

## 2022-06-10 DIAGNOSIS — N289 Disorder of kidney and ureter, unspecified: Secondary | ICD-10-CM

## 2022-06-10 DIAGNOSIS — E782 Mixed hyperlipidemia: Secondary | ICD-10-CM

## 2022-06-10 DIAGNOSIS — R011 Cardiac murmur, unspecified: Secondary | ICD-10-CM

## 2022-06-10 NOTE — Progress Notes (Signed)
Established Patient Office Visit  Subjective   Patient ID: [name], female    DOB: 11/07/1950  Age: 71 y.o. MRN: [id number]  Chief Complaint  Patient presents with   71mofollow up    Patient has no questions or concerns    HLD: Patient is been focusing on lifestyle measures to control cholesterol.  She would like to avoid statin therapy if possible.  Repeat lipid panel collected last week which showed LDL of 183, this was fasting.  Reduced GFR: Repeat lab work done last week which showed GFR of 70 with no significant albuminuria.  Last creatinine stable at 0.84.      Review of Systems  Respiratory:  Negative for shortness of breath.   Cardiovascular:  Negative for chest pain.  Neurological:  Negative for dizziness and headaches.      Objective:     BP 122/68 (BP Location: Right Arm, Patient Position: Sitting, Cuff Size: Normal)   Pulse 78   Temp 97.8 F (36.6 C) (Oral)   Ht '5\' 4"'$  (1.626 m)   Wt 124 lb (56.2 kg)   SpO2 96%   BMI 21.28 kg/m    Physical Exam Vitals reviewed.  Constitutional:      General: She is not in acute distress.    Appearance: Normal appearance.  HENT:     Head: Normocephalic and atraumatic.  Neck:     Vascular: No carotid bruit.  Cardiovascular:     Rate and Rhythm: Normal rate. Rhythm irregular.     Pulses: Normal pulses.     Heart sounds: Murmur heard.  Pulmonary:     Effort: Pulmonary effort is normal.     Breath sounds: Normal breath sounds.  Skin:    General: Skin is warm and dry.  Neurological:     General: No focal deficit present.     Mental Status: She is alert and oriented to person, place, and time.  Psychiatric:        Mood and Affect: Mood normal.        Behavior: Behavior normal.        Judgment: Judgment normal.      No results found for any visits on 06/10/22.    The 10-year ASCVD risk score (Arnett DK, et al., 2019) is: 10.8%    Assessment & Plan:   Problem List Items Addressed This Visit        Genitourinary   Kidney insufficiency    Chronic, stable, no albuminuria.  Patient to continue focusing on hydrating thoroughly with water throughout the day and eating healthy diet.  Plan to continue monitoring periodically.  No additional workup or evaluation with specialist recommended at this time.        Other   Hyperlipidemia    Chronic, ASCVD risk score greater than 10%.  Patient educated on use of statin therapy to reduce cholesterol as well as stabilize any plaques that may be currently present in hopes of reduction in risk of heart attack or stroke.  Patient would still prefer to focus on lifestyle and avoid taking pharmacological therapy at this time.  Patient encouraged to follow heart healthy diet, patient will follow-up in approximate 6 months or sooner as needed.      Murmur - Primary    Possible very faint murmur heard on exam today.  Patient also has irregular heart rhythm, she has had EKG which shows PACs in the past.  She is asymptomatic.  Will order cardiac echocardiogram for further evaluation  of anatomy of patient's heart and efficiency of function.  Further recommendations may be made based upon these results.      Relevant Orders   ECHOCARDIOGRAM COMPLETE    Return in about 6 months (around 12/11/2022) for F/U with Judson Roch.    Ailene Ards, NP

## 2022-06-10 NOTE — Assessment & Plan Note (Signed)
Chronic, stable, no albuminuria.  Patient to continue focusing on hydrating thoroughly with water throughout the day and eating healthy diet.  Plan to continue monitoring periodically.  No additional workup or evaluation with specialist recommended at this time.

## 2022-06-10 NOTE — Assessment & Plan Note (Signed)
Chronic, ASCVD risk score greater than 10%.  Patient educated on use of statin therapy to reduce cholesterol as well as stabilize any plaques that may be currently present in hopes of reduction in risk of heart attack or stroke.  Patient would still prefer to focus on lifestyle and avoid taking pharmacological therapy at this time.  Patient encouraged to follow heart healthy diet, patient will follow-up in approximate 6 months or sooner as needed.

## 2022-06-10 NOTE — Assessment & Plan Note (Signed)
Possible very faint murmur heard on exam today.  Patient also has irregular heart rhythm, she has had EKG which shows PACs in the past.  She is asymptomatic.  Will order cardiac echocardiogram for further evaluation of anatomy of patient's heart and efficiency of function.  Further recommendations may be made based upon these results.

## 2022-06-17 DIAGNOSIS — H25811 Combined forms of age-related cataract, right eye: Secondary | ICD-10-CM | POA: Diagnosis not present

## 2022-06-17 DIAGNOSIS — H26492 Other secondary cataract, left eye: Secondary | ICD-10-CM | POA: Diagnosis not present

## 2022-06-17 DIAGNOSIS — H04123 Dry eye syndrome of bilateral lacrimal glands: Secondary | ICD-10-CM | POA: Diagnosis not present

## 2022-06-17 DIAGNOSIS — Z961 Presence of intraocular lens: Secondary | ICD-10-CM | POA: Diagnosis not present

## 2022-06-25 ENCOUNTER — Ambulatory Visit (INDEPENDENT_AMBULATORY_CARE_PROVIDER_SITE_OTHER): Payer: Medicare PPO

## 2022-06-25 DIAGNOSIS — R011 Cardiac murmur, unspecified: Secondary | ICD-10-CM | POA: Diagnosis not present

## 2022-06-25 LAB — ECHOCARDIOGRAM COMPLETE
AR max vel: 2.11 cm2
AV Area VTI: 2.37 cm2
AV Area mean vel: 2.23 cm2
AV Mean grad: 7 mmHg
AV Peak grad: 14.6 mmHg
AV Vena cont: 0.44 cm
Ao pk vel: 1.91 m/s
Area-P 1/2: 3.54 cm2
Calc EF: 66.1 %
S' Lateral: 2.34 cm
Single Plane A2C EF: 60.9 %
Single Plane A4C EF: 68.7 %

## 2022-06-30 ENCOUNTER — Other Ambulatory Visit: Payer: Self-pay | Admitting: Nurse Practitioner

## 2022-06-30 DIAGNOSIS — R011 Cardiac murmur, unspecified: Secondary | ICD-10-CM

## 2022-07-19 DIAGNOSIS — Z1283 Encounter for screening for malignant neoplasm of skin: Secondary | ICD-10-CM | POA: Diagnosis not present

## 2022-07-19 DIAGNOSIS — D225 Melanocytic nevi of trunk: Secondary | ICD-10-CM | POA: Diagnosis not present

## 2022-07-21 ENCOUNTER — Telehealth: Payer: Self-pay | Admitting: Nurse Practitioner

## 2022-07-21 NOTE — Telephone Encounter (Signed)
LVM for pt to rtn my call to schedule AWV with [facility] call back # [phone] 

## 2022-07-26 ENCOUNTER — Encounter (HOSPITAL_BASED_OUTPATIENT_CLINIC_OR_DEPARTMENT_OTHER): Payer: Self-pay | Admitting: Cardiology

## 2022-07-26 ENCOUNTER — Ambulatory Visit (HOSPITAL_BASED_OUTPATIENT_CLINIC_OR_DEPARTMENT_OTHER): Payer: Medicare PPO | Admitting: Cardiology

## 2022-07-26 VITALS — BP 110/72 | HR 78 | Ht 64.0 in | Wt 122.2 lb

## 2022-07-26 DIAGNOSIS — R011 Cardiac murmur, unspecified: Secondary | ICD-10-CM

## 2022-07-26 DIAGNOSIS — I2510 Atherosclerotic heart disease of native coronary artery without angina pectoris: Secondary | ICD-10-CM

## 2022-07-26 DIAGNOSIS — Z712 Person consulting for explanation of examination or test findings: Secondary | ICD-10-CM

## 2022-07-26 DIAGNOSIS — Z79899 Other long term (current) drug therapy: Secondary | ICD-10-CM | POA: Diagnosis not present

## 2022-07-26 DIAGNOSIS — I700 Atherosclerosis of aorta: Secondary | ICD-10-CM

## 2022-07-26 DIAGNOSIS — E7800 Pure hypercholesterolemia, unspecified: Secondary | ICD-10-CM

## 2022-07-26 MED ORDER — ROSUVASTATIN CALCIUM 10 MG PO TABS: 10.0000 mg | ORAL_TABLET | Freq: Every day | ORAL | 3 refills | Status: DC

## 2022-07-26 NOTE — Progress Notes (Signed)
Cardiology Office Note:    Date:  07/26/2022   ID:  [name], DOB Nov 06, 1950, MRN [id number]  PCP:  Ailene Ards, NP  Cardiologist:  Buford Dresser, MD  Referring MD: Ailene Ards, NP   CC: new patient evaluation for heart murmur  History of Present Illness:    [name] is a 71 y.o. female with a hx of hyperlipidemia, anemia, depression who is seen as a new consult at the request of Ailene Ards, NP for the evaluation and management of heart murmur.  She saw her PCP 06/10/2022. A very faint heart murmur was noticed upon exam. She has had an EKG in the past which showed PACs. An echocardiogram was ordered for further evaluation, with recommendations to be based on results.  Today: She appears to be well. She was referred by her PCP Jeralyn Ruths, NP, after she heard a murmur two different times. The cardiology referral was "just to be safe."  She denies any prior cardiac history. No significant heart disease in her family. No limitations on activity.  We reviewed her echo results together from 06/25/22. Noted EF 70-75%, small pericardial effusion, thickened aortic valve with mild-moderate AR.  She denies any palpitations, chest pain, shortness of breath, or peripheral edema. No lightheadedness, headaches, syncope, orthopnea, or PND.  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   resection 2-3cm gastric GIST 11/2008; annual EGD w/o recurrence 07/2011 - follow clinically   OSTEOPOROSIS 2002   on bisphos   Sarcoidosis 2009    Past Surgical History:  Procedure Laterality Date   BREAST SURGERY     biospy   CATARACT EXTRACTION Left 07/2013   dr Herbert Deaner   Child birth  1977    COLONOSCOPY  2011   [place]  11/2008   2-3cm GIST excision   TONSILLECTOMY  1957   TUBAL LIGATION     UPPER GASTROINTESTINAL ENDOSCOPY      Current Medications: Current Outpatient Medications on File Prior to Visit  Medication Sig   Cholecalciferol (VITAMIN D3) 1000 UNITS CAPS Take by mouth daily.   diphenhydramine-acetaminophen (TYLENOL PM) 25-500 MG TABS tablet Take 1 tablet by mouth at bedtime as needed.   mirtazapine (REMERON) 15 MG tablet Take 1-2 tablets by mouth every evening   polyethylene glycol powder (GLYCOLAX/MIRALAX) 17 GM/SCOOP powder Take 17 g by mouth as needed.    Risedronate Sodium 35 MG TBEC Take 1 tablet by mouth once a week.    topiramate (TOPAMAX) 50 MG tablet Take 1 tablet in the morning and two at night   No current facility-administered medications on file prior to visit.     Allergies:   Augmentin [amoxicillin-pot clavulanate], Azithromycin, Codeine, Hydrocodone-acetaminophen, Morphine and related, and Primidone   Social History   Tobacco Use   Smoking status: Never   Smokeless tobacco: Never  Vaping Use   Vaping Use: Never used  Substance Use Topics   Alcohol use: Yes    Alcohol/week: 3.0 - 4.0 standard drinks of alcohol    Types: 3 - 4 Glasses of wine per week    Comment: 2glasses twice a month  Drug use: No    Family History: family history includes Allergies in her daughter and mother; Breast cancer in her maternal aunt; Colon cancer in her mother; Dermatomyositis in her sister; Esophageal cancer in her maternal grandfather; Hodgkin's lymphoma (age of onset: 74) in her father; Prostate cancer in her maternal uncle; Tremor in her mother. There is no history of Stomach cancer. Paternal grandfather died of a heart attack in his 48s.    ROS:   Please see the history of present illness.  Additional pertinent ROS: Constitutional: Negative for chills, fever, night sweats, unintentional weight loss  HENT: Negative for ear pain and hearing  loss.   Eyes: Negative for loss of vision and eye pain.  Respiratory: Negative for cough, sputum, wheezing.   Cardiovascular: See HPI. Gastrointestinal: Negative for abdominal pain, melena, and hematochezia.  Genitourinary: Negative for dysuria and hematuria.  Musculoskeletal: Negative for falls and myalgias.  Skin: Negative for itching and rash.  Neurological: Negative for focal weakness, focal sensory changes and loss of consciousness.  Endo/Heme/Allergies: Does not bruise/bleed easily.     EKGs/Labs/Other Studies Reviewed:    The following studies were reviewed today:  Echo 06/25/2022: 1. Left ventricular ejection fraction, by estimation, is 70 to 75%. The  left ventricle has hyperdynamic function. The left ventricle has no  regional wall motion abnormalities. Left ventricular diastolic parameters  are indeterminate.   2. Right ventricular systolic function is normal. The right ventricular  size is normal.   3. Small pericardial effusion present with some stranding/consolidation.  Most prominent around RA/RV. It does not appear to be hemodynamically  destabilizing. Marland Kitchen a small pericardial effusion is present.   4. The mitral valve is normal in structure. Mild mitral valve  regurgitation.   5. AV is difficult to see It is thickened, calcified without significant  stenosis. There is mild to moderate AI. Cannot exclude that the valve  isn't functionally bicuspid.. Aortic valve regurgitation is mild to  moderate.   6. The inferior vena cava is normal in size with greater than 50%  respiratory variability, suggesting right atrial pressure of 3 mmHg.    CT Chest 10/16/2021: IMPRESSION: 1. Mid and lower lung zone predominant bronchiectasis, mucoid impaction, peribronchovascular nodularity and volume loss, likely minimally progressive from 10/20/2020. Findings may be due to sarcoid but mycobacterium avium complex is also considered. 2. Patchy ground-glass, nodularity and mild  architectural distortion in the apical segment right upper lobe, unchanged and possibly related to sarcoid. 3. Small pericardial effusion, new. 4. Partially calcified and noncalcified mediastinal adenopathy, unchanged and in keeping with the provided history of sarcoid. 5. Aortic atherosclerosis (ICD10-I70.0). Coronary artery calcification.   CTA Chest PE 10/20/2020: IMPRESSION: 1. No demonstrable pulmonary embolus. No thoracic aortic aneurysm or dissection.   2. Multifocal adenopathy which appear similar to prior study. Suspect adenopathy secondary to sarcoidosis given the relative stability since the 2010 study. Superimposed neoplastic involvement cannot be entirely excluded in this circumstance.   3. Areas of patchy infiltrate, most notably in the apical segment of the right upper lobe. Scattered areas of infiltrate elsewhere likely other foci of pneumonia. Note that areas of underlying mild scarring and atelectasis could be sequelae of prior sarcoidosis. There is a degree of underlying centrilobular emphysematous change.   4.  Small pericardial effusion of uncertain etiology.   5.  Hepatic steatosis.   Emphysema (ICD10-J43.9).   EKG:  EKG is personally reviewed.   07/26/22: SR at 78 bpm with PACs  Recent Labs: 11/06/2021: ALT 20;  Hemoglobin 14.6; Platelets 272.0; TSH 2.93 06/03/2022: BUN 14; Creatinine, Ser 0.84; Potassium 4.0; Sodium 140  Recent Lipid Panel    Component Value Date/Time   CHOL 261 (H) 06/03/2022 1014   TRIG 188.0 (H) 06/03/2022 1014   TRIG 113 04/10/2007 0000   HDL 39.80 06/03/2022 1014   CHOLHDL 7 06/03/2022 1014   VLDL 37.6 06/03/2022 1014   LDLCALC 183 (H) 06/03/2022 1014   LDLCALC 23 04/10/2007 0000   LDLDIRECT 167.0 11/06/2021 0856    Physical Exam:    VS:  BP 110/72 (BP Location: Right Arm, Patient Position: Sitting, Cuff Size: Normal)   Pulse 78   Ht _0  (1.626 m)   Wt 122 lb 3.2 oz (55.4 kg)   BMI 20.98 kg/m     Wt Readings from  Last 3 Encounters:  08/06/22 122 lb (55.3 kg)  07/26/22 122 lb 3.2 oz (55.4 kg)  06/10/22 124 lb (56.2 kg)    GEN: Well nourished, well developed in no acute distress HEENT: Normal, moist mucous membranes NECK: No JVD CARDIAC: regular rhythm, normal S1 and S2, no rubs or gallops. Systolic 2/6 murmur. VASCULAR: Radial and DP pulses 2+ bilaterally. No carotid bruits RESPIRATORY:  Clear to auscultation without rales, wheezing or rhonchi  ABDOMEN: Soft, non-tender, non-distended MUSCULOSKELETAL:  Ambulates independently SKIN: Warm and dry, no edema NEUROLOGIC:  Alert and oriented x 3. No focal neuro deficits noted. PSYCHIATRIC:  Normal affect    ASSESSMENT:    1. Murmur   2. Coronary artery calcification seen on CT scan   3. Aortic atherosclerosis ([place])   4. Encounter to discuss test results   5. Hypercholesterolemia   6. Medication management     PLAN:    Murmur Aortic calcification, cannot exclude bicuspid valve Mild-moderate AR -reviewed her echo test results together today. No significant aortic stenosis, but there is sclerosis with mild-moderate aortic regurgitation. Repeat echo for change in symptoms.  Coronary calcification seen on CT Aortic atherosclerosis Hypercholesterolemia -We discussed the pathophysiology of cholesterol plaque formation, the role of calcium and why it is a marker, how plaque is key to acute MI/CVA, and how known plaque is managed with medications.   -after shared decision making, she is amenable to starting rosuvastatin. Recheck lipids and LFTs in 2-3 months -we discussed role of aspirin as well, will start with statin  Cardiac risk counseling and prevention recommendations: -recommend heart healthy/Mediterranean diet, with whole grains, fruits, vegetable, fish, lean meats, nuts, and olive oil. Limit salt. -recommend moderate walking, 3-5 times/week for 30-50 minutes each session. Aim for at least 150 minutes.week. Goal should be pace of 3  miles/hours, or walking 1.5 miles in 30 minutes -recommend avoidance of tobacco products. Avoid excess alcohol. -ASCVD risk score: The 10-year ASCVD risk score (Arnett DK, et al., 2019) is: 10.8%   Values used to calculate the score:     Age: 50 years     Sex: Female     Is Non-Hispanic African American: No     Diabetic: No     Tobacco smoker: No     Systolic Blood Pressure: 388 mmHg     Is BP treated: No     HDL Cholesterol: 39.8 mg/dL     Total Cholesterol: 261 mg/dL    Plan for follow up: PRN.  Buford Dresser, MD, PhD, [facility]    Medication Adjustments/Labs and Tests Ordered: Current medicines are reviewed at length with the patient today.  Concerns regarding medicines are outlined  above.  Orders Placed This Encounter  Procedures   Lipid panel   Hepatic function panel   EKG 12-Lead   Meds ordered this encounter  Medications   rosuvastatin (CRESTOR) 10 MG tablet    Sig: Take 1 tablet (10 mg total) by mouth daily.    Dispense:  90 tablet    Refill:  3   Patient Instructions  Medication Instructions:  START: Rosuvastatin 10 mg daily  *If you need a refill on your cardiac medications before your next appointment, please call your pharmacy*   Lab Work: Your provider has recommended lab work in December 2023 (Lipid, LFT). Please have this collected at [facility] [place]. The lab is open 8:00 am - 4:30 pm. Please avoid 12:00p - 1:00p for lunch hour. You do not need an appointment. Please go to [address] [place], [place] [address]. This is in the Primary Care office on the 3rd floor, let them know you are there for blood work and they will direct you to the lab.  If you have labs (blood work) drawn today and your tests are completely normal, you will receive your results only by: [place] (if you have MyChart) OR A paper copy in the mail If you have any lab test that is abnormal or we need to change  your treatment, we will call you to review the results.   Testing/Procedures: None ordered today   Follow-Up: At [facility], you and your health needs are our priority.  As part of our continuing mission to provide you with exceptional heart care, we have created designated Provider Care Teams.  These Care Teams include your primary Cardiologist (physician) and Advanced Practice Providers (APPs -  Physician Assistants and Nurse Practitioners) who all work together to provide you with the care you need, when you need it.  We recommend signing up for the patient portal called "MyChart".  Sign up information is provided on this After Visit Summary.  MyChart is used to connect with patients for Virtual Visits (Telemedicine).  Patients are able to view lab/test results, encounter notes, upcoming appointments, etc.  Non-urgent messages can be sent to your provider as well.   To learn more about what you can do with MyChart, go to [email or web address].    Your next appointment:   As needed  The format for your next appointment:   In Person  Provider:   Buford Dresser, MD           I,Breanna Adamick,acting as a scribe for Buford Dresser, MD.,have documented all relevant documentation on the behalf of Buford Dresser, MD,as directed by  Buford Dresser, MD while in the presence of Buford Dresser, MD.  I, Buford Dresser, MD, have reviewed all documentation for this visit. The documentation on 08/15/22 for the exam, diagnosis, procedures, and orders are all accurate and complete.   Signed, Buford Dresser, MD PhD 07/26/2022     [place]

## 2022-07-26 NOTE — Patient Instructions (Signed)
Medication Instructions:  START: Rosuvastatin 10 mg daily  *If you need a refill on your cardiac medications before your next appointment, please call your pharmacy*   Lab Work: Your provider has recommended lab work in December 2023 (Lipid, LFT). Please have this collected at [facility] [place]. The lab is open 8:00 am - 4:30 pm. Please avoid 12:00p - 1:00p for lunch hour. You do not need an appointment. Please go to [address] [place], [place] [address]. This is in the Primary Care office on the 3rd floor, let them know you are there for blood work and they will direct you to the lab.  If you have labs (blood work) drawn today and your tests are completely normal, you will receive your results only by: [place] (if you have MyChart) OR A paper copy in the mail If you have any lab test that is abnormal or we need to change your treatment, we will call you to review the results.   Testing/Procedures: None ordered today   Follow-Up: At [facility], you and your health needs are our priority.  As part of our continuing mission to provide you with exceptional heart care, we have created designated Provider Care Teams.  These Care Teams include your primary Cardiologist (physician) and Advanced Practice Providers (APPs -  Physician Assistants and Nurse Practitioners) who all work together to provide you with the care you need, when you need it.  We recommend signing up for the patient portal called "MyChart".  Sign up information is provided on this After Visit Summary.  MyChart is used to connect with patients for Virtual Visits (Telemedicine).  Patients are able to view lab/test results, encounter notes, upcoming appointments, etc.  Non-urgent messages can be sent to your provider as well.   To learn more about what you can do with MyChart, go to [email or web address].    Your next appointment:   As needed  The format for your next  appointment:   In Person  Provider:   Buford Dresser, MD

## 2022-07-30 ENCOUNTER — Telehealth: Payer: Self-pay

## 2022-07-30 NOTE — Telephone Encounter (Signed)
Patient is scheduled for a mammogram and bone density on 10/18 with [facility].

## 2022-08-06 ENCOUNTER — Ambulatory Visit (INDEPENDENT_AMBULATORY_CARE_PROVIDER_SITE_OTHER): Payer: Medicare PPO

## 2022-08-06 VITALS — Ht 64.0 in | Wt 122.0 lb

## 2022-08-06 DIAGNOSIS — Z0000 Encounter for general adult medical examination without abnormal findings: Secondary | ICD-10-CM

## 2022-08-06 NOTE — Progress Notes (Signed)
Subjective:   [name] is a 71 y.o. female who presents for Medicare Annual (Subsequent) preventive examination.   Virtual Visit via Telephone Note  I connected with  [name] on 08/06/22 at  1:30 PM EDT by telephone and verified that I am speaking with the correct person using two identifiers.  Location: Patient: home  Provider: Esmond Plants  Persons participating in the virtual visit: [place]   I discussed the limitations, risks, security and privacy concerns of performing an evaluation and management service by telephone and the availability of in person appointments. The patient expressed understanding and agreed to proceed.  Interactive audio and video telecommunications were attempted between this nurse and patient, however failed, due to patient having technical difficulties OR patient did not have access to video capability.  We continued and completed visit with audio only.  Some vital signs may be absent or patient reported.   Daphane Shepherd, LPN  Review of Systems     Cardiac Risk Factors include: advanced age (>59mn, >>77women)     Objective:    Today's Vitals   08/06/22 1336  Weight: 122 lb (55.3 kg)  Height: '5\' 4"'$  (1.626 m)   Body mass index is 20.94 kg/m.     08/06/2022    1:41 PM 08/14/2021   10:48 AM 02/10/2021    2:56 PM 08/21/2020    2:58 PM 03/13/2020    2:18 PM 05/10/2019   11:16 AM 08/17/2018   11:39 AM  Advanced Directives  Does Patient Have a Medical Advance Directive? Yes Yes Yes Yes Yes No;Yes Yes  Type of AParamedicof ALake [place]oreLiving will Living will HInterlakenLiving will HAirport Road AdditionLiving will HTrain[place]Living will Living will;Healthcare Power of [place]anut[place]Living will  Copy of [place] Chart? No - copy requested      No - copy requested    Current Medications  (verified) Outpatient Encounter Medications as of 08/06/2022  Medication Sig   Cholecalciferol (VITAMIN D3) 1000 UNITS CAPS Take by mouth daily.   diphenhydramine-acetaminophen (TYLENOL PM) 25-500 MG TABS tablet Take 1 tablet by mouth at bedtime as needed.   mirtazapine (REMERON) 15 MG tablet Take 1-2 tablets by mouth every evening   polyethylene glycol powder (GLYCOLAX/MIRALAX) 17 GM/SCOOP powder Take 17 g by mouth as needed.    Risedronate Sodium 35 MG TBEC Take 1 tablet by mouth once a week.    rosuvastatin (CRESTOR) 10 MG tablet Take 1 tablet (10 mg total) by mouth daily.   topiramate (TOPAMAX) 50 MG tablet Take 1 tablet in the morning and two at night   No facility-administered encounter medications on file as of 08/06/2022.    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   resection 2-3cm gastric GIST 11/2008; annual EGD w/o recurrence 07/2011 - follow clinically   OSTEOPOROSIS 2002   on bisphos   Sarcoidosis 2009   Past Surgical History:  Procedure Laterality Date   BREAST  SURGERY     biospy   CATARACT EXTRACTION Left 07/2013   dr Herbert Deaner   Child birth  1977   COLONOSCOPY  2011   [facility]  11/2008   2-3cm GIST excision   TONSILLECTOMY  1957   TUBAL LIGATION     UPPER GASTROINTESTINAL ENDOSCOPY     Family History  Problem Relation Age of Onset   Allergies Mother    Colon cancer Mother    Tremor Mother    Hodgkin's lymphoma Father 33   Dermatomyositis Sister    Allergies Daughter    Breast cancer Maternal Aunt     Prostate cancer Maternal Uncle    Esophageal cancer Maternal Grandfather    Stomach cancer Neg Hx    Social History   Socioeconomic History   Marital status: Divorced    Spouse name: Not on file   Number of children: 1   Years of education: Not on file   Highest education level: Bachelor's degree (e.g., BA, AB, BS)  Occupational History   Occupation: retired    Fish farm manager: RETIRED    Comment: Publishing copy  Tobacco Use   Smoking status: Never   Smokeless tobacco: Never  Vaping Use   Vaping Use: Never used  Substance and Sexual Activity   Alcohol use: Yes    Alcohol/week: 3.0 - 4.0 standard drinks of alcohol    Types: 3 - 4 Glasses of wine per week    Comment: 2glasses twice a month   Drug use: No   Sexual activity: Not Currently  Other Topics Concern   Not on file  Social History Narrative   Divorced, lives alone. Pt has 1 dtr. 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   Fun/Hobby: Reading, movies and genology    Social Determinants of Health   Financial Resource Strain: Low Risk  (08/06/2022)   Overall Financial Resource Strain (CARDIA)    Difficulty of Paying Living Expenses: Not hard at all  Food Insecurity: No Food Insecurity (08/06/2022)   Hunger Vital Sign    Worried About Running Out of Food in the Last Year: Never true    Ran Out of Food in the Last Year: Never true  Transportation Needs: No Transportation Needs (08/06/2022)   PRAPARE - Hydrologist (Medical): No    Lack of Transportation (Non-Medical): No  Physical Activity: Insufficiently Active (08/06/2022)   Exercise Vital Sign    Days of Exercise per Week: 3 days    Minutes of Exercise per Session: 30 min  Stress: No Stress Concern Present (08/06/2022)   [place]    Feeling of Stress : Not at all  Social Connections: Socially Isolated (08/06/2022)   Social Connection and Isolation Panel [NHANES]     Frequency of Communication with Friends and Family: More than three times a week    Frequency of Social Gatherings with Friends and Family: More than three times a week    Attends Religious Services: Never    Marine scientist or Organizations: No    Attends Music therapist: Never    Marital Status: Divorced    Tobacco Counseling Counseling given: Not Answered   Clinical Intake:  Pre-visit preparation completed: Yes  Pain : No/denies pain     Nutritional Risks: None Diabetes: No  How often do you need to have someone help you when you read instructions, pamphlets, or other written materials from your doctor  or pharmacy?: 1 - Never  Diabetic?no   Interpreter Needed?: No  Information entered by :: Jadene Pierini, LPN   Activities of Daily Living    08/06/2022    1:41 PM 11/05/2021    9:03 AM  In your present state of health, do you have any difficulty performing the following activities:  Hearing? 0 0  Vision? 0 0  Difficulty concentrating or making decisions? 0 0  Walking or climbing stairs? 0 0  Dressing or bathing? 0 0  Doing errands, shopping? 0 0  Preparing Food and eating ? N   Using the Toilet? N   In the past six months, have you accidently leaked urine? N   Do you have problems with loss of bowel control? N   Managing your Medications? N   Managing your Finances? N   Housekeeping or managing your Housekeeping? N     Patient Care Team: Ailene Ards, NP as PCP - General (Nurse Practitioner) Buford Dresser, MD as PCP - Cardiology (Cardiology) Milus Banister, MD (Gastroenterology) Tat, Eustace Quail, DO (Neurology) Avon Gully, NP (Obstetrics and Gynecology) Monna Fam, MD (Ophthalmology)  Indicate any recent Medical Services you may have received from other than [facility] providers in the past year (date may be approximate).     Assessment:   This is a routine wellness examination for Danay.  Hearing/Vision  screen Vision Screening - Comments:: Annual eye exam wear glasses  Dietary issues and exercise activities discussed: Current Exercise Habits: Home exercise routine, Type of exercise: walking, Time (Minutes): 30, Frequency (Times/Week): 3, Weekly Exercise (Minutes/Week): 90, Intensity: Mild, Exercise limited by: None identified   Goals Addressed             This Visit's Progress    Patient Stated   On track    I want to increase my physical activity by getting motivated and go back to the gym.        Depression Screen    08/06/2022    1:39 PM 06/10/2022   10:38 AM 12/11/2021   10:41 AM 11/05/2021    9:01 AM 03/13/2020    2:18 PM 08/17/2018   12:19 PM 06/29/2017   10:27 AM  PHQ 2/9 Scores  PHQ - 2 Score 0 0 0 2 0 0 0  PHQ- 9 Score 0 0  4       Fall Risk    08/06/2022    1:38 PM 06/10/2022   10:38 AM 12/11/2021   10:41 AM 11/05/2021    9:04 AM 08/14/2021   10:48 AM  Fall Risk   Falls in the past year? 0 0 0 0 0  Number falls in past yr: 0 0 0 0 0  Injury with Fall? 0 0 0 0 0  Risk for fall due to : No Fall Risks No Fall Risks     Follow up Falls prevention discussed Falls evaluation completed       FALL RISK PREVENTION PERTAINING TO THE HOME:  Any stairs in or around the home? No  If so, are there any without handrails? No  Home free of loose throw rugs in walkways, pet beds, electrical cords, etc? Yes  Adequate lighting in your home to reduce risk of falls? Yes   ASSISTIVE DEVICES UTILIZED TO PREVENT FALLS:  Life alert? No  Use of a cane, walker or w/c? No  Grab bars in the bathroom? Yes  Shower chair or bench in shower? No  Elevated toilet seat or a handicapped  toilet? No   Immunizations Immunization History  Administered Date(s) Administered   Fluad Quad(high Dose 65+) 07/04/2019   Influenza Split 10/12/2011, 07/27/2013   Influenza, High Dose Seasonal PF 06/29/2017, 08/02/2018   Influenza,inj,Quad PF,6+ Mos 07/18/2014   Influenza-Unspecified 07/31/2015,  08/01/2020   PFIZER(Purple Top)SARS-COV-2 Vaccination 11/21/2019, 12/12/2019, 08/28/2020   Pneumococcal Conjugate-13 06/14/2016   Pneumococcal Polysaccharide-23 08/21/2018   Td 11/02/2003   Tdap 11/06/2013   Zoster Recombinat (Shingrix) 08/31/2019   Zoster, Live 03/28/2013    TDAP status: Up to date  Flu Vaccine status: Up to date  Pneumococcal vaccine status: Up to date  Covid-19 vaccine status: Completed vaccines  Qualifies for Shingles Vaccine? Yes   Zostavax completed Yes   Shingrix Completed?: Yes  Screening Tests Health Maintenance  Topic Date Due   COVID-19 Vaccine (4 - Pfizer risk series) 10/23/2020   INFLUENZA VACCINE  06/01/2022   MAMMOGRAM  07/16/2022   Zoster Vaccines- Shingrix (2 of 2) 09/10/2022 (Originally 10/26/2019)   TETANUS/TDAP  11/07/2023   COLONOSCOPY (Pts 45-58yr Insurance coverage will need to be confirmed)  06/11/2026   Pneumonia Vaccine 71 Years old  Completed   DEXA SCAN  Completed   Hepatitis C Screening  Completed   HPV VACCINES  Aged Out    Health Maintenance  Health Maintenance Due  Topic Date Due   COVID-19 Vaccine (4 - [place]isk series) 10/23/2020   INFLUENZA VACCINE  06/01/2022   MAMMOGRAM  07/16/2022    Colorectal cancer screening: Type of screening: Colonoscopy. Completed 06/11/2016. Repeat every 10 years  Mammogram status: Ordered scheduled 10/158/2023. Pt provided with contact info and advised to call to schedule appt.   Bone Density status: Completed 02/24/2015. Results reflect: Bone density results: OSTEOPENIA. Repeat every 5 years.  Lung Cancer Screening: (Low Dose CT Chest recommended if Age 71-80years, 30 pack-year currently smoking OR have quit w/in 15years.) does not qualify.   Lung Cancer Screening Referral: n/a  Additional Screening:  Hepatitis C Screening: does not qualify; Completed 10/28/2015  Vision Screening: Recommended annual ophthalmology exams for early detection of glaucoma and other disorders of  the eye. Is the patient up to date with their annual eye exam?  Yes  Who is the provider or what is the name of the office in which the patient attends annual eye exams? Dr.Van  If pt is not established with a provider, would they like to be referred to a provider to establish care? No .   Dental Screening: Recommended annual dental exams for proper oral hygiene  Community Resource Referral / Chronic Care Management: CRR required this visit?  No   CCM required this visit?  No      Plan:     I have personally reviewed and noted the following in the patient's chart:   Medical and social history Use of alcohol, tobacco or illicit drugs  Current medications and supplements including opioid prescriptions. Patient is not currently taking opioid prescriptions. Functional ability and status Nutritional status Physical activity Advanced directives List of other physicians Hospitalizations, surgeries, and ER visits in previous 12 months Vitals Screenings to include cognitive, depression, and falls Referrals and appointments  In addition, I have reviewed and discussed with patient certain preventive protocols, quality metrics, and best practice recommendations. A written personalized care plan for preventive services as well as general preventive health recommendations were provided to patient.     LDaphane Shepherd LPN   128/01/1323  Nurse Notes: none

## 2022-08-06 NOTE — Patient Instructions (Signed)
[name] , Thank you for taking time to come for your Medicare Wellness Visit. I appreciate your ongoing commitment to your health goals. Please review the following plan we discussed and let me know if I can assist you in the future.   These are the goals we discussed:  Goals      Patient Stated     I want to increase my physical activity by getting motivated and go back to the gym.         This is a list of the screening recommended for you and due dates:  Health Maintenance  Topic Date Due   COVID-19 Vaccine (4 - Pfizer risk series) 10/23/2020   Flu Shot  06/01/2022   Mammogram  07/16/2022   Zoster (Shingles) Vaccine (2 of 2) 09/10/2022*   Tetanus Vaccine  11/07/2023   Colon Cancer Screening  06/11/2026   Pneumonia Vaccine  Completed   DEXA scan (bone density measurement)  Completed   Hepatitis C Screening: USPSTF Recommendation to screen - Ages 40-79 yo.  Completed   HPV Vaccine  Aged Out  *Topic was postponed. The date shown is not the original due date.    Advanced directives: Please bring a copy of your health care power of attorney and living will to the office to be added to your chart at your convenience.   Conditions/risks identified: Aim for 30 minutes of exercise or brisk walking, 6-8 glasses of water, and 5 servings of fruits and vegetables each day.   Next appointment: Follow up in one year for your annual wellness visit    Preventive Care 65 Years and Older, Female Preventive care refers to lifestyle choices and visits with your health care provider that can promote health and wellness. What does preventive care include? A yearly physical exam. This is also called an annual well check. Dental exams once or twice a year. Routine eye exams. Ask your health care provider how often you should have your eyes checked. Personal lifestyle choices, including: Daily care of your teeth and gums. Regular physical activity. Eating a healthy diet. Avoiding tobacco and  drug use. Limiting alcohol use. Practicing safe sex. Taking low-dose aspirin every day. Taking vitamin and mineral supplements as recommended by your health care provider. What happens during an annual well check? The services and screenings done by your health care provider during your annual well check will depend on your age, overall health, lifestyle risk factors, and family history of disease. Counseling  Your health care provider may ask you questions about your: Alcohol use. Tobacco use. Drug use. Emotional well-being. Home and relationship well-being. Sexual activity. Eating habits. History of falls. Memory and ability to understand (cognition). Work and work Statistician. Reproductive health. Screening  You may have the following tests or measurements: Height, weight, and BMI. Blood pressure. Lipid and cholesterol levels. These may be checked every 5 years, or more frequently if you are over 16 years old. Skin check. Lung cancer screening. You may have this screening every year starting at age 78 if you have a 30-pack-year history of smoking and currently smoke or have quit within the past 15 years. Fecal occult blood test (FOBT) of the stool. You may have this test every year starting at age 14. Flexible sigmoidoscopy or colonoscopy. You may have a sigmoidoscopy every 5 years or a colonoscopy every 10 years starting at age 75. Hepatitis C blood test. Hepatitis B blood test. Sexually transmitted disease (STD) testing. Diabetes screening. This is done by  checking your blood sugar (glucose) after you have not eaten for a while (fasting). You may have this done every 1-3 years. Bone density scan. This is done to screen for osteoporosis. You may have this done starting at age 33. Mammogram. This may be done every 1-2 years. Talk to your health care provider about how often you should have regular mammograms. Talk with your health care provider about your test results, treatment  options, and if necessary, the need for more tests. Vaccines  Your health care provider may recommend certain vaccines, such as: Influenza vaccine. This is recommended every year. Tetanus, diphtheria, and acellular pertussis (Tdap, Td) vaccine. You may need a Td booster every 10 years. Zoster vaccine. You may need this after age 74. Pneumococcal 13-valent conjugate (PCV13) vaccine. One dose is recommended after age 35. Pneumococcal polysaccharide (PPSV23) vaccine. One dose is recommended after age 39. Talk to your health care provider about which screenings and vaccines you need and how often you need them. This information is not intended to replace advice given to you by your health care provider. Make sure you discuss any questions you have with your health care provider. Document Released: 11/14/2015 Document Revised: 07/07/2016 Document Reviewed: 08/19/2015 Elsevier Interactive Patient Education  2017 [place] Prevention in the Home Falls can cause injuries. They can happen to people of all ages. There are many things you can do to make your home safe and to help prevent falls. What can I do on the outside of my home? Regularly fix the edges of walkways and driveways and fix any cracks. Remove anything that might make you trip as you walk through a door, such as a raised step or threshold. Trim any bushes or trees on the path to your home. Use bright outdoor lighting. Clear any walking paths of anything that might make someone trip, such as rocks or tools. Regularly check to see if handrails are loose or broken. Make sure that both sides of any steps have handrails. Any raised decks and porches should have guardrails on the edges. Have any leaves, snow, or ice cleared regularly. Use sand or salt on walking paths during winter. Clean up any spills in your garage right away. This includes oil or grease spills. What can I do in the bathroom? Use night lights. Install grab  bars by the toilet and in the tub and shower. Do not use towel bars as grab bars. Use non-skid mats or decals in the tub or shower. If you need to sit down in the shower, use a plastic, non-slip stool. Keep the floor dry. Clean up any water that spills on the floor as soon as it happens. Remove soap buildup in the tub or shower regularly. Attach bath mats securely with double-sided non-slip rug tape. Do not have throw rugs and other things on the floor that can make you trip. What can I do in the bedroom? Use night lights. Make sure that you have a light by your bed that is easy to reach. Do not use any sheets or blankets that are too big for your bed. They should not hang down onto the floor. Have a firm chair that has side arms. You can use this for support while you get dressed. Do not have throw rugs and other things on the floor that can make you trip. What can I do in the kitchen? Clean up any spills right away. Avoid walking on wet floors. Keep items that you use a  lot in easy-to-reach places. If you need to reach something above you, use a strong step stool that has a grab bar. Keep electrical cords out of the way. Do not use floor polish or wax that makes floors slippery. If you must use wax, use non-skid floor wax. Do not have throw rugs and other things on the floor that can make you trip. What can I do with my stairs? Do not leave any items on the stairs. Make sure that there are handrails on both sides of the stairs and use them. Fix handrails that are broken or loose. Make sure that handrails are as long as the stairways. Check any carpeting to make sure that it is firmly attached to the stairs. Fix any carpet that is loose or worn. Avoid having throw rugs at the top or bottom of the stairs. If you do have throw rugs, attach them to the floor with carpet tape. Make sure that you have a light switch at the top of the stairs and the bottom of the stairs. If you do not have them,  ask someone to add them for you. What else can I do to help prevent falls? Wear shoes that: Do not have high heels. Have rubber bottoms. Are comfortable and fit you well. Are closed at the toe. Do not wear sandals. If you use a stepladder: Make sure that it is fully opened. Do not climb a closed stepladder. Make sure that both sides of the stepladder are locked into place. Ask someone to hold it for you, if possible. Clearly mark and make sure that you can see: Any grab bars or handrails. First and last steps. Where the edge of each step is. Use tools that help you move around (mobility aids) if they are needed. These include: Canes. Walkers. Scooters. Crutches. Turn on the lights when you go into a dark area. Replace any light bulbs as soon as they burn out. Set up your furniture so you have a clear path. Avoid moving your furniture around. If any of your floors are uneven, fix them. If there are any pets around you, be aware of where they are. Review your medicines with your doctor. Some medicines can make you feel dizzy. This can increase your chance of falling. Ask your doctor what other things that you can do to help prevent falls. This information is not intended to replace advice given to you by your health care provider. Make sure you discuss any questions you have with your health care provider. Document Released: 08/14/2009 Document Revised: 03/25/2016 Document Reviewed: 11/22/2014 Elsevier Interactive Patient Education  2017 [employer].

## 2022-08-16 ENCOUNTER — Ambulatory Visit: Payer: Medicare PPO | Admitting: Neurology

## 2022-08-18 DIAGNOSIS — Z780 Asymptomatic menopausal state: Secondary | ICD-10-CM | POA: Diagnosis not present

## 2022-08-18 DIAGNOSIS — M85852 Other specified disorders of bone density and structure, left thigh: Secondary | ICD-10-CM | POA: Diagnosis not present

## 2022-08-18 DIAGNOSIS — M810 Age-related osteoporosis without current pathological fracture: Secondary | ICD-10-CM | POA: Diagnosis not present

## 2022-08-18 DIAGNOSIS — Z1231 Encounter for screening mammogram for malignant neoplasm of breast: Secondary | ICD-10-CM | POA: Diagnosis not present

## 2022-08-18 DIAGNOSIS — M85851 Other specified disorders of bone density and structure, right thigh: Secondary | ICD-10-CM | POA: Diagnosis not present

## 2022-08-18 LAB — HM DEXA SCAN: HM Dexa Scan: -2.6

## 2022-08-18 LAB — HM MAMMOGRAPHY

## 2022-09-28 DIAGNOSIS — R251 Tremor, unspecified: Secondary | ICD-10-CM | POA: Diagnosis not present

## 2022-09-28 DIAGNOSIS — Z01419 Encounter for gynecological examination (general) (routine) without abnormal findings: Secondary | ICD-10-CM | POA: Diagnosis not present

## 2022-09-28 DIAGNOSIS — Z01411 Encounter for gynecological examination (general) (routine) with abnormal findings: Secondary | ICD-10-CM | POA: Diagnosis not present

## 2022-09-28 DIAGNOSIS — Z124 Encounter for screening for malignant neoplasm of cervix: Secondary | ICD-10-CM | POA: Diagnosis not present

## 2022-09-28 DIAGNOSIS — Z6821 Body mass index (BMI) 21.0-21.9, adult: Secondary | ICD-10-CM | POA: Diagnosis not present

## 2022-09-28 DIAGNOSIS — D259 Leiomyoma of uterus, unspecified: Secondary | ICD-10-CM | POA: Diagnosis not present

## 2022-09-28 DIAGNOSIS — M810 Age-related osteoporosis without current pathological fracture: Secondary | ICD-10-CM | POA: Diagnosis not present

## 2022-10-11 DIAGNOSIS — Z79899 Other long term (current) drug therapy: Secondary | ICD-10-CM | POA: Diagnosis not present

## 2022-10-12 LAB — LIPID PANEL
Chol/HDL Ratio: 3.3 ratio (ref 0.0–4.4)
Cholesterol, Total: 180 mg/dL (ref 100–199)
HDL: 54 mg/dL (ref >39)
LDL Chol Calc (NIH): 102 mg/dL — ABNORMAL HIGH (ref 0–99)
Triglycerides: 138 mg/dL (ref 0–149)
VLDL Cholesterol Cal: 24 mg/dL (ref 5–40)

## 2022-10-12 LAB — HEPATIC FUNCTION PANEL
ALT: 35 IU/L — ABNORMAL HIGH (ref 0–32)
AST: 31 IU/L (ref 0–40)
Albumin: 4.5 g/dL (ref 3.8–4.8)
Alkaline Phosphatase: 53 IU/L (ref 44–121)
Bilirubin Total: 0.3 mg/dL (ref 0.0–1.2)
Bilirubin, Direct: 0.11 mg/dL (ref 0.00–0.40)
Total Protein: 7.5 g/dL (ref 6.0–8.5)

## 2022-10-19 ENCOUNTER — Telehealth (HOSPITAL_BASED_OUTPATIENT_CLINIC_OR_DEPARTMENT_OTHER): Payer: Self-pay

## 2022-10-19 DIAGNOSIS — I2510 Atherosclerotic heart disease of native coronary artery without angina pectoris: Secondary | ICD-10-CM

## 2022-10-19 DIAGNOSIS — E7800 Pure hypercholesterolemia, unspecified: Secondary | ICD-10-CM

## 2022-10-19 DIAGNOSIS — I700 Atherosclerosis of aorta: Secondary | ICD-10-CM

## 2022-10-19 MED ORDER — ROSUVASTATIN CALCIUM 20 MG PO TABS: 20.0000 mg | ORAL_TABLET | Freq: Every day | ORAL | 3 refills | Status: DC

## 2022-10-19 NOTE — Telephone Encounter (Addendum)
  Seen by patient [name] on 10/18/2022 10:48 PM; orders placed and follow up mychart message sent to patient.    ----- Message from Loel Dubonnet, NP sent at 10/18/2022 10:04 PM EST ----- 06/03/22 LDL 183 now 10/11/22 LDL 102. Improved from previous but not yet at goal of <70. Increase Rosuvastatin to '20mg'$  daily. One of 3 liver enzymes mildly elevated - ensure avoiding Acetaminophen (tylenol), fried foods, alcohol.  Repeat LFT/FLP in 2-3 months.

## 2022-11-08 DIAGNOSIS — L218 Other seborrheic dermatitis: Secondary | ICD-10-CM | POA: Diagnosis not present

## 2022-11-16 NOTE — Progress Notes (Signed)
Assessment/Plan:    1.  Essential Tremor  -pt back on topamax and she thinks that it is helping.  She would like to continue on the medication.  It is not causing cognitive deficits.  She would like to trial increasing again to 50 mg, 2 po bid.  When she did this in the past she had eds but thinks likely will tolerate it now.  -discussed dbs and focused ultrasound today.  She is not interested.  She is very worried about shaving head for both procedures as well.  -future options:gabapentin or zonisamide.  2.  F/u 9 months to 1 year  Subjective:   [name] was seen today in follow up for essential tremor.  My previous records were reviewed prior to todays visit.  I last saw the patient in 08/2021.   She wonders if she could potentially increase the topamax again. She didn't tolerate that in the past but would like to trial that.  Current movement disorder medications: Topamax, 50 mg in the morning and 100 mg at bedtime (higher dosages with EDS)  PREVIOUS MEDICATIONS: Primidone (150 mg twice a day); propranolol; topiramate  ALLERGIES:   Allergies  Allergen Reactions   Augmentin [Amoxicillin-Pot Clavulanate] Other (See Comments)    Diarrhea   Azithromycin Diarrhea   Codeine Nausea Only    REACTION: nausea   Hydrocodone-Acetaminophen Nausea Only   Morphine And Related Nausea Only    Intense nausea   Primidone     CURRENT MEDICATIONS:  Outpatient Encounter Medications as of 11/18/2022  Medication Sig   Cholecalciferol (VITAMIN D3) 1000 UNITS CAPS Take by mouth daily.   diphenhydramine-acetaminophen (TYLENOL PM) 25-500 MG TABS tablet Take 1 tablet by mouth at bedtime as needed.   mirtazapine (REMERON) 15 MG tablet Take 1-2 tablets by mouth every evening   polyethylene glycol powder (GLYCOLAX/MIRALAX) 17 GM/SCOOP powder Take 17 g by mouth as needed.    Risedronate Sodium 35 MG TBEC Take 1 tablet by mouth once a week.    rosuvastatin (CRESTOR) 20 MG tablet Take 1 tablet  (20 mg total) by mouth daily.   topiramate (TOPAMAX) 50 MG tablet Take 1 tablet in the morning and two at night   No facility-administered encounter medications on file as of 11/18/2022.     Objective:    PHYSICAL EXAMINATION:    VITALS:   Vitals:   11/18/22 0912  BP: 122/82  Pulse: 72  SpO2: 97%  Weight: 126 lb 6.4 oz (57.3 kg)  Height: '5\' 4"'$  (1.626 m)   GEN:  The patient appears stated age and is in NAD. HEENT:  Normocephalic, atraumatic.  The mucous membranes are moist. The superficial temporal arteries are without ropiness or tenderness.  Neurological examination:  Orientation: The patient is alert and oriented x3. Cranial nerves: There is good facial symmetry. The speech is fluent and clear. Soft palate rises symmetrically and there is no tongue deviation. Hearing is intact to conversational tone. Sensation: Sensation is intact to light touch throughout Motor: Strength is at least antigravity x4.  Movement examination: Tone: There is normal tone in the UE/LE Abnormal movements: she still has a moderate postural tremor on the right and mild to mod on the left.   Archimedes spirals are drawn a little bit better.  Trouble getting pen to paper for archimedes spirals.  This is similar to previous Coordination:  There is no decremation with RAM's Gait and Station: The patient has no difficulty arising out of a deep-seated chair  without the use of the hands. The patient's stride length is good (watched her walking to checkout desk) I have reviewed and interpreted the following labs independently   Chemistry      Component Value Date/Time   NA 140 06/03/2022 1014   K 4.0 06/03/2022 1014   CL 106 06/03/2022 1014   CO2 26 06/03/2022 1014   BUN 14 06/03/2022 1014   CREATININE 0.84 06/03/2022 1014      Component Value Date/Time   CALCIUM 9.5 06/03/2022 1014   ALKPHOS 53 10/11/2022 0925   AST 31 10/11/2022 0925   ALT 35 (H) 10/11/2022 0925   BILITOT 0.3 10/11/2022 0925       Lab Results  Component Value Date   WBC 8.0 11/06/2021   HGB 14.6 11/06/2021   HCT 44.4 11/06/2021   MCV 91.8 11/06/2021   PLT 272.0 11/06/2021   Lab Results  Component Value Date   TSH 2.93 11/06/2021     Chemistry      Component Value Date/Time   NA 140 06/03/2022 1014   K 4.0 06/03/2022 1014   CL 106 06/03/2022 1014   CO2 26 06/03/2022 1014   BUN 14 06/03/2022 1014   CREATININE 0.84 06/03/2022 1014      Component Value Date/Time   CALCIUM 9.5 06/03/2022 1014   ALKPHOS 53 10/11/2022 0925   AST 31 10/11/2022 0925   ALT 35 (H) 10/11/2022 0925   BILITOT 0.3 10/11/2022 0925         Cc:  Ailene Ards, NP

## 2022-11-18 ENCOUNTER — Other Ambulatory Visit: Payer: Self-pay

## 2022-11-18 ENCOUNTER — Ambulatory Visit: Payer: Medicare PPO | Admitting: Neurology

## 2022-11-18 DIAGNOSIS — G250 Essential tremor: Secondary | ICD-10-CM | POA: Diagnosis not present

## 2022-11-18 MED ORDER — TOPIRAMATE 50 MG PO TABS: 100.0000 mg | ORAL_TABLET | Freq: Two times a day (BID) | ORAL | 2 refills | Status: DC

## 2022-11-18 NOTE — Patient Instructions (Signed)
Increase primidone 50 mg, 2 tablets twice per day  The physicians and staff at [facility] are committed to providing excellent care. You may receive a survey requesting feedback about your experience at our office. We strive to receive "very good" responses to the survey questions. If you feel that your experience would prevent you from giving the office a "very good " response, please contact our office to try to remedy the situation. We may be reached at [phone]. Thank you for taking the time out of your busy day to complete the survey.

## 2022-11-19 ENCOUNTER — Other Ambulatory Visit: Payer: Self-pay | Admitting: Nurse Practitioner

## 2022-11-19 DIAGNOSIS — R5383 Other fatigue: Secondary | ICD-10-CM

## 2022-11-19 DIAGNOSIS — F411 Generalized anxiety disorder: Secondary | ICD-10-CM

## 2022-12-06 ENCOUNTER — Telehealth: Payer: Self-pay | Admitting: Nurse Practitioner

## 2022-12-06 ENCOUNTER — Other Ambulatory Visit: Payer: Self-pay

## 2022-12-06 DIAGNOSIS — F411 Generalized anxiety disorder: Secondary | ICD-10-CM

## 2022-12-06 DIAGNOSIS — R5383 Other fatigue: Secondary | ICD-10-CM

## 2022-12-06 MED ORDER — MIRTAZAPINE 15 MG PO TABS: ORAL_TABLET | ORAL | 2 refills | Status: DC

## 2022-12-06 NOTE — Progress Notes (Signed)
Pt medication request send

## 2022-12-06 NOTE — Telephone Encounter (Signed)
Caller & Relationship to patient:  self   Call back number:  [phone]   Date of last office visit:   Date of next office visit:  12/23/2022   Medication(s) to be refilled:  Remeron generic        Preferred Pharmacy:  [facility] [address]

## 2022-12-23 ENCOUNTER — Ambulatory Visit: Payer: Medicare PPO | Admitting: Nurse Practitioner

## 2022-12-23 VITALS — BP 120/78 | HR 71 | Temp 98.0°F | Ht 64.0 in | Wt 125.0 lb

## 2022-12-23 DIAGNOSIS — E782 Mixed hyperlipidemia: Secondary | ICD-10-CM | POA: Diagnosis not present

## 2022-12-23 DIAGNOSIS — Z0000 Encounter for general adult medical examination without abnormal findings: Secondary | ICD-10-CM | POA: Insufficient documentation

## 2022-12-23 DIAGNOSIS — Z0001 Encounter for general adult medical examination with abnormal findings: Secondary | ICD-10-CM | POA: Insufficient documentation

## 2022-12-23 NOTE — Progress Notes (Signed)
   Established Patient Office Visit  Subjective   Patient ID: [name], female    DOB: 1950-12-07  Age: 72 y.o. MRN: [id number]  Chief Complaint  Patient presents with   Hyperlipidemia   Patient to follow chronic conditions.  Continues to follow with cardiology for hyperlipidemia as well as heart murmur.  Overall feeling well no new symptoms.  Tolerates Crestor 20 mg tablet daily well.  Also sees neurology for treatment of tremor, continues on Topamax 135m twice daily, tolerating well.  Wondering if she is due for any immunizations.  Per her immunization history handout that she has with her today she is up-to-date with COVID, flu, Tdap, and has completed zoster.  She is completed pneumonia 136in May 23 vaccinations previously.  Would prefer to hold off on pneumonia 20 unless absolute necessary.     Review of Systems  Respiratory:  Negative for shortness of breath.   Cardiovascular:  Negative for chest pain.      Objective:     BP 120/78   Pulse 71   Temp 98 F (36.7 C) (Temporal)   Ht 5' 4"$  (1.626 m)   Wt 125 lb (56.7 kg)   SpO2 98%   BMI 21.46 kg/m    Physical Exam Vitals reviewed.  Constitutional:      General: She is not in acute distress.    Appearance: Normal appearance.  HENT:     Head: Normocephalic and atraumatic.  Neck:     Vascular: No carotid bruit.  Cardiovascular:     Rate and Rhythm: Normal rate and regular rhythm.     Pulses: Normal pulses.     Heart sounds: Murmur heard.  Pulmonary:     Effort: Pulmonary effort is normal.     Breath sounds: Normal breath sounds.  Skin:    General: Skin is warm and dry.  Neurological:     General: No focal deficit present.     Mental Status: She is alert and oriented to person, place, and time.  Psychiatric:        Mood and Affect: Mood normal.        Behavior: Behavior normal.        Judgment: Judgment normal.      No results found for any visits on 12/23/22.    The 10-year ASCVD risk score  (Arnett DK, et al., 2019) is: 9.1%    Assessment & Plan:   Problem List Items Addressed This Visit       Other   Hyperlipidemia    Chronic, improved with Crestor.  Patient to follow-up with cardiology as scheduled for repeat lipid panel next month.      Health care maintenance - Primary    Completed shared decision-making discussion, for now patient would like to defer pneumonia 20 vaccination.  Would be eligible for this in October 2024, may consider discussion at next office visit or sooner as needed.       Return in about 1 year (around 12/24/2023) for F/U with SJudson Roch    SAilene Ards NP

## 2022-12-23 NOTE — Assessment & Plan Note (Signed)
Completed shared decision-making discussion, for now patient would like to defer pneumonia 20 vaccination.  Would be eligible for this in October 2024, may consider discussion at next office visit or sooner as needed.

## 2022-12-23 NOTE — Assessment & Plan Note (Signed)
Chronic, improved with Crestor.  Patient to follow-up with cardiology as scheduled for repeat lipid panel next month.

## 2023-01-19 DIAGNOSIS — E7800 Pure hypercholesterolemia, unspecified: Secondary | ICD-10-CM | POA: Diagnosis not present

## 2023-01-19 DIAGNOSIS — I700 Atherosclerosis of aorta: Secondary | ICD-10-CM | POA: Diagnosis not present

## 2023-01-19 DIAGNOSIS — I2510 Atherosclerotic heart disease of native coronary artery without angina pectoris: Secondary | ICD-10-CM | POA: Diagnosis not present

## 2023-01-20 LAB — HEPATIC FUNCTION PANEL
ALT: 41 IU/L — ABNORMAL HIGH (ref 0–32)
AST: 31 IU/L (ref 0–40)
Albumin: 4.1 g/dL (ref 3.8–4.8)
Alkaline Phosphatase: 63 IU/L (ref 44–121)
Bilirubin Total: 0.4 mg/dL (ref 0.0–1.2)
Bilirubin, Direct: 0.11 mg/dL (ref 0.00–0.40)
Total Protein: 6.6 g/dL (ref 6.0–8.5)

## 2023-01-20 LAB — LIPID PANEL
Chol/HDL Ratio: 2.8 ratio (ref 0.0–4.4)
Cholesterol, Total: 139 mg/dL (ref 100–199)
HDL: 49 mg/dL (ref >39)
LDL Chol Calc (NIH): 63 mg/dL (ref 0–99)
Triglycerides: 162 mg/dL — ABNORMAL HIGH (ref 0–149)
VLDL Cholesterol Cal: 27 mg/dL (ref 5–40)

## 2023-01-24 DIAGNOSIS — B078 Other viral warts: Secondary | ICD-10-CM | POA: Diagnosis not present

## 2023-01-24 DIAGNOSIS — L820 Inflamed seborrheic keratosis: Secondary | ICD-10-CM | POA: Diagnosis not present

## 2023-03-25 ENCOUNTER — Ambulatory Visit: Payer: Medicare PPO | Admitting: Internal Medicine

## 2023-03-25 ENCOUNTER — Encounter: Payer: Self-pay | Admitting: Internal Medicine

## 2023-03-25 VITALS — BP 128/80 | HR 68 | Temp 98.2°F | Ht 64.0 in | Wt 122.0 lb

## 2023-03-25 DIAGNOSIS — J069 Acute upper respiratory infection, unspecified: Secondary | ICD-10-CM

## 2023-03-25 NOTE — Patient Instructions (Signed)

## 2023-03-25 NOTE — Progress Notes (Signed)
Subjective:    Patient ID: [name], female    DOB: 10/29/1951, 72 y.o.   MRN: [id number]      HPI [name] is here for  Chief Complaint  Patient presents with   Nasal Congestion    Symptoms started 12 days ago. She woke up this morning and felt better.     She is here for an acute visit for cold symptoms.   Her symptoms started 12 days ago.  Feeling much better today.    She is experiencing significant nasal congestion, ear pain only when she blow her nose, sneezing, cough that sounded loose and croupy.  She denies any fevers, shortness of breath, wheezing or sinus pain.  She feels much better today compared to yesterday.  She has tried taking zyrtec, advil   Covid test neg.   Medications and allergies reviewed with patient and updated if appropriate.  Current Outpatient Medications on File Prior to Visit  Medication Sig Dispense Refill   Cholecalciferol (VITAMIN D3) 1000 UNITS CAPS Take by mouth daily.     diphenhydramine-acetaminophen (TYLENOL PM) 25-500 MG TABS tablet Take 1 tablet by mouth at bedtime as needed.     mirtazapine (REMERON) 15 MG tablet Take 1-2 tablets by mouth Armstrong evening 90 tablet 2   polyethylene glycol powder (GLYCOLAX/MIRALAX) 17 GM/SCOOP powder Take 17 g by mouth as needed.      Risedronate Sodium 35 MG TBEC Take 1 tablet by mouth once a week.   1   rosuvastatin (CRESTOR) 20 MG tablet Take 1 tablet (20 mg total) by mouth daily. 90 tablet 3   topiramate (TOPAMAX) 50 MG tablet Take 2 tablets (100 mg total) by mouth 2 (two) times daily. 360 tablet 2   No current facility-administered medications on file prior to visit.    Review of Systems  Constitutional:  Negative for fever.  HENT:  Positive for congestion, ear pain (when blowing nose) and sneezing. Negative for sinus pressure, sinus pain and sore throat.   Respiratory:  Positive for cough (loose,croupy). Negative for shortness of breath and wheezing.   Gastrointestinal:  Negative  for diarrhea (loose stools).  Neurological:  Negative for light-headedness and headaches.       Objective:   Vitals:   03/25/23 0840  BP: 128/80  Pulse: 68  Temp: 98.2 F (36.8 C)  SpO2: 98%   BP Readings from Last 3 Encounters:  03/25/23 128/80  12/23/22 120/78  11/18/22 122/82   Wt Readings from Last 3 Encounters:  03/25/23 122 lb (55.3 kg)  12/23/22 125 lb (56.7 kg)  11/18/22 126 lb 6.4 oz (57.3 kg)   Body mass index is 20.94 kg/m.    Physical Exam Constitutional:      General: She is not in acute distress.    Appearance: Normal appearance. She is not ill-appearing.  HENT:     Head: Normocephalic and atraumatic.     Right Ear: Tympanic membrane, ear canal and external ear normal.     Left Ear: Tympanic membrane, ear canal and external ear normal.     Mouth/Throat:     Mouth: Mucous membranes are moist.     Pharynx: No oropharyngeal exudate or posterior oropharyngeal erythema.  Eyes:     Conjunctiva/sclera: Conjunctivae normal.  Cardiovascular:     Rate and Rhythm: Normal rate and regular rhythm.  Pulmonary:     Effort: Pulmonary effort is normal. No respiratory distress.     Breath sounds: Normal breath sounds. No wheezing  or rales.  Musculoskeletal:     Cervical back: Neck supple. No tenderness.  Lymphadenopathy:     Cervical: No cervical adenopathy.  Skin:    General: Skin is warm and dry.  Neurological:     Mental Status: She is alert.            Assessment & Plan:    URI: Acute Symptoms likely viral in nature She is feeling better today At home covid test negative Continue symptomatic treatment with over-the-counter cold medications, Tylenol/ibuprofen Increase rest and fluids Call if symptoms worsen or do not improve

## 2023-06-21 DIAGNOSIS — H25811 Combined forms of age-related cataract, right eye: Secondary | ICD-10-CM | POA: Diagnosis not present

## 2023-06-21 DIAGNOSIS — H26492 Other secondary cataract, left eye: Secondary | ICD-10-CM | POA: Diagnosis not present

## 2023-06-21 DIAGNOSIS — H04123 Dry eye syndrome of bilateral lacrimal glands: Secondary | ICD-10-CM | POA: Diagnosis not present

## 2023-06-21 DIAGNOSIS — H3554 Dystrophies primarily involving the retinal pigment epithelium: Secondary | ICD-10-CM | POA: Diagnosis not present

## 2023-07-19 NOTE — Progress Notes (Unsigned)
Assessment/Plan:    1.  Essential Tremor  -pt back on topamax and she thinks that it is helping.  She would like to continue on the medication.  It is not causing cognitive deficits.  She would like to trial increasing again to 50 mg, 2 po bid.  When she did this in the past she had eds but thinks likely will tolerate it now.  -discussed dbs and focused ultrasound today.  She is not interested.  She is very worried about shaving head for both procedures as well.  -future options:gabapentin or zonisamide.  2.  F/u 9 months to 1 year  Subjective:   [name] was seen today in follow up for essential tremor.  My previous records were reviewed prior to todays visit.  I last saw her in January, 2024.  At that point in time, she wanted to try and increase her Topamax to 100 mg twice per day.  She had not tolerated it in the past, but thought she might do better this time.  I have not heard from her since that time.  She reports today that she is still on the medication.  She is tolerating it well.  Current movement disorder medications: Topamax, 100 mg bid  PREVIOUS MEDICATIONS: Primidone (150 mg twice a day); propranolol; topiramate  ALLERGIES:   Allergies  Allergen Reactions   Augmentin [Amoxicillin-Pot Clavulanate] Other (See Comments)    Diarrhea   Azithromycin Diarrhea   Codeine Nausea Only    REACTION: nausea   Hydrocodone-Acetaminophen Nausea Only   Morphine And Codeine Nausea Only    Intense nausea   Primidone     CURRENT MEDICATIONS:  Outpatient Encounter Medications as of 07/20/2023  Medication Sig   Cholecalciferol (VITAMIN D3) 1000 UNITS CAPS Take by mouth daily.   diphenhydramine-acetaminophen (TYLENOL PM) 25-500 MG TABS tablet Take 1 tablet by mouth at bedtime as needed.   mirtazapine (REMERON) 15 MG tablet Take 1-2 tablets by mouth every evening   polyethylene glycol powder (GLYCOLAX/MIRALAX) 17 GM/SCOOP powder Take 17 g by mouth as needed.    Risedronate  Sodium 35 MG TBEC Take 1 tablet by mouth once a week.    rosuvastatin (CRESTOR) 20 MG tablet Take 1 tablet (20 mg total) by mouth daily.   topiramate (TOPAMAX) 50 MG tablet Take 2 tablets (100 mg total) by mouth 2 (two) times daily.   No facility-administered encounter medications on file as of 07/20/2023.     Objective:    PHYSICAL EXAMINATION:    VITALS:   There were no vitals filed for this visit.  GEN:  The patient appears stated age and is in NAD. HEENT:  Normocephalic, atraumatic.  The mucous membranes are moist. The superficial temporal arteries are without ropiness or tenderness.  Neurological examination:  Orientation: The patient is alert and oriented x3. Cranial nerves: There is good facial symmetry. The speech is fluent and clear. Soft palate rises symmetrically and there is no tongue deviation. Hearing is intact to conversational tone. Sensation: Sensation is intact to light touch throughout Motor: Strength is at least antigravity x4.  Movement examination: Tone: There is normal tone in the UE/LE Abnormal movements: she still has a moderate postural tremor on the right and mild to mod on the left.   Archimedes spirals are drawn a little bit better.  Trouble getting pen to paper for archimedes spirals.  This is similar to previous Coordination:  There is no decremation with RAM's Gait and Station: The patient  has no difficulty arising out of a deep-seated chair without the use of the hands. The patient's stride length is good (watched her walking to checkout desk) I have reviewed and interpreted the following labs independently   Chemistry      Component Value Date/Time   NA 140 06/03/2022 1014   K 4.0 06/03/2022 1014   CL 106 06/03/2022 1014   CO2 26 06/03/2022 1014   BUN 14 06/03/2022 1014   CREATININE 0.84 06/03/2022 1014      Component Value Date/Time   CALCIUM 9.5 06/03/2022 1014   ALKPHOS 63 01/19/2023 0950   AST 31 01/19/2023 0950   ALT 41 (H)  01/19/2023 0950   BILITOT 0.4 01/19/2023 0950      Lab Results  Component Value Date   WBC 8.0 11/06/2021   HGB 14.6 11/06/2021   HCT 44.4 11/06/2021   MCV 91.8 11/06/2021   PLT 272.0 11/06/2021   Lab Results  Component Value Date   TSH 2.93 11/06/2021     Chemistry      Component Value Date/Time   NA 140 06/03/2022 1014   K 4.0 06/03/2022 1014   CL 106 06/03/2022 1014   CO2 26 06/03/2022 1014   BUN 14 06/03/2022 1014   CREATININE 0.84 06/03/2022 1014      Component Value Date/Time   CALCIUM 9.5 06/03/2022 1014   ALKPHOS 63 01/19/2023 0950   AST 31 01/19/2023 0950   ALT 41 (H) 01/19/2023 0950   BILITOT 0.4 01/19/2023 0950      Total time spent on today's visit was *** minutes, including both face-to-face time and nonface-to-face time.  Time included that spent on review of records (prior notes available to me/labs/imaging if pertinent), discussing treatment and goals, answering patient's questions and coordinating care.    Cc:  Elenore Paddy, NP

## 2023-07-20 ENCOUNTER — Ambulatory Visit: Payer: Medicare PPO | Admitting: Neurology

## 2023-07-20 VITALS — BP 122/80 | HR 91 | Ht 64.0 in | Wt 124.2 lb

## 2023-07-20 DIAGNOSIS — G250 Essential tremor: Secondary | ICD-10-CM

## 2023-07-20 NOTE — Patient Instructions (Signed)
The physicians and staff at [facility] are committed to providing excellent care. You may receive a survey requesting feedback about your experience at our office. We strive to receive "very good" responses to the survey questions. If you feel that your experience would prevent you from giving the office a "very good " response, please contact our office to try to remedy the situation. We may be reached at [phone]. Thank you for taking the time out of your busy day to complete the survey.

## 2023-07-21 ENCOUNTER — Ambulatory Visit (INDEPENDENT_AMBULATORY_CARE_PROVIDER_SITE_OTHER): Payer: Medicare PPO

## 2023-07-21 VITALS — Ht 64.0 in | Wt 124.0 lb

## 2023-07-21 DIAGNOSIS — Z0000 Encounter for general adult medical examination without abnormal findings: Secondary | ICD-10-CM

## 2023-07-21 NOTE — Patient Instructions (Addendum)
[name] , Thank you for taking time to come for your Medicare Wellness Visit. I appreciate your ongoing commitment to your health goals. Please review the following plan we discussed and let me know if I can assist you in the future.   Referrals/Orders/Follow-Ups/Clinician Recommendations:   This is a list of the screening recommended for you and due dates:  Health Maintenance  Topic Date Due   Flu Shot  06/02/2023   COVID-19 Vaccine (5 - 2023-24 season) 07/03/2023   DTaP/Tdap/Td vaccine (3 - Td or Tdap) 11/07/2023   Medicare Annual Wellness Visit  07/20/2024   Mammogram  08/18/2024   Colon Cancer Screening  06/11/2026   Pneumonia Vaccine  Completed   DEXA scan (bone density measurement)  Completed   Hepatitis C Screening  Completed   Zoster (Shingles) Vaccine  Completed   HPV Vaccine  Aged Out    Advanced directives: (Copy Requested) Please bring a copy of your health care power of attorney and living will to the office to be added to your chart at your convenience.  Next Medicare Annual Wellness Visit scheduled for next year:

## 2023-07-21 NOTE — Progress Notes (Signed)
Subjective:   [name] is a 72 y.o. female who presents for Medicare Annual (Subsequent) preventive examination.  Visit Complete: Virtual  I connected with  [name] on 07/21/23 by a audio enabled telemedicine application and verified that I am speaking with the correct person using two identifiers.  Patient Location: Home  Provider Location: Home Office  I discussed the limitations of evaluation and management by telemedicine. The patient expressed understanding and agreed to proceed.  Patient Medicare AWV questionnaire was completed by the patient on 07/20/23; I have confirmed that all information answered by patient is correct and no changes since this date. Vital Signs: Unable to obtain new vitals due to this being a telehealth visit.   Cardiac Risk Factors include: advanced age (>49men, >27 women);Other (see comment), Risk factor comments: Dx: PAC     Objective:    Today's Vitals   07/21/23 0838  Weight: 124 lb (56.2 kg)  Height: 5\' 4"  (1.626 m)   Body mass index is 21.28 kg/m.     07/21/2023    8:47 AM 07/20/2023   11:05 AM 11/18/2022    9:13 AM 08/06/2022    1:41 PM 08/14/2021   10:48 AM 02/10/2021    2:56 PM 08/21/2020    2:58 PM  Advanced Directives  Does Patient Have a Medical Advance Directive? Yes Yes Yes Yes Yes Yes Yes  Type of Estate agent of [place];Living will Living will Living will Healthcare Power of [place];Living will Living will Healthcare Power of [place];Living will Healthcare Power of [place];Living will  Copy of Healthcare Power of Attorney in Chart? No - copy requested   No - copy requested       Current Medications (verified) Outpatient Encounter Medications as of 07/21/2023  Medication Sig   Cholecalciferol (VITAMIN D3) 1000 UNITS CAPS Take by mouth daily.   mirtazapine (REMERON) 15 MG tablet Take 1-2 tablets by mouth every evening   polyethylene glycol powder (GLYCOLAX/MIRALAX) 17 GM/SCOOP powder  Take 17 g by mouth as needed.    rosuvastatin (CRESTOR) 20 MG tablet Take 1 tablet (20 mg total) by mouth daily.   topiramate (TOPAMAX) 50 MG tablet Take 2 tablets (100 mg total) by mouth 2 (two) times daily.   No facility-administered encounter medications on file as of 07/21/2023.    Allergies (verified) Augmentin [amoxicillin-pot clavulanate], Azithromycin, Codeine, Hydrocodone-acetaminophen, Morphine and codeine, and Primidone   History: Past Medical History:  Diagnosis Date   Actinic keratosis    ALLERGIC RHINITIS    ANEMIA-NOS    Anxiety    Cataract 2014   left cataract extraction with lens implant   DEPRESSION    FIBROIDS, UTERUS    HYPERLIPIDEMIA    Macular degeneration of both eyes 2012   mild   Malignant GIST (gastrointestinal stromal tumor) (HCC) 2009   resection 2-3cm gastric GIST 11/2008; annual EGD w/o recurrence 07/2011 - follow clinically   OSTEOPOROSIS 2002   on bisphos   Sarcoidosis 2009   Past Surgical History:  Procedure Laterality Date   BREAST SURGERY     biospy   CATARACT EXTRACTION Left 07/2013   dr Elmer Picker   Child birth  1977   COLONOSCOPY  2011   STOMACH SURGERY  11/2008   2-3cm GIST excision   TONSILLECTOMY  1957   TUBAL LIGATION     UPPER GASTROINTESTINAL ENDOSCOPY     Family History  Problem Relation Age of Onset   Allergies Mother    Colon cancer Mother  Tremor Mother    Hodgkin's lymphoma Father 40   Dermatomyositis Sister    Allergies Daughter    Breast cancer Maternal Aunt    Prostate cancer Maternal Uncle    Esophageal cancer Maternal Grandfather    Stomach cancer Neg Hx    Social History   Socioeconomic History   Marital status: Divorced    Spouse name: Not on file   Number of children: 1   Years of education: Not on file   Highest education level: Bachelor's degree (e.g., BA, AB, BS)  Occupational History   Occupation: retired    Associate Professor: RETIRED    Comment: Stage manager  Tobacco Use   Smoking status: Never    Smokeless tobacco: Never  Vaping Use   Vaping status: Never Used  Substance and Sexual Activity   Alcohol use: Yes    Alcohol/week: 3.0 - 4.0 standard drinks of alcohol    Types: 3 - 4 Glasses of wine per week    Comment: 2glasses twice a month   Drug use: No   Sexual activity: Not Currently  Other Topics Concern   Not on file  Social History Narrative   Divorced, lives alone. Pt has 1 dtr. 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   Fun/Hobby: Reading, movies and genology    Social Determinants of Health   Financial Resource Strain: Low Risk  (07/20/2023)   Overall Financial Resource Strain (CARDIA)    Difficulty of Paying Living Expenses: Not hard at all  Food Insecurity: No Food Insecurity (07/20/2023)   Hunger Vital Sign    Worried About Running Out of Food in the Last Year: Never true    Ran Out of Food in the Last Year: Never true  Transportation Needs: No Transportation Needs (07/20/2023)   PRAPARE - Administrator, Civil Service (Medical): No    Lack of Transportation (Non-Medical): No  Physical Activity: Insufficiently Active (07/20/2023)   Exercise Vital Sign    Days of Exercise per Week: 1 day    Minutes of Exercise per Session: 60 min  Stress: No Stress Concern Present (07/20/2023)   [employer] of Occupational Health - Occupational Stress Questionnaire    Feeling of Stress : Not at all  Social Connections: Socially Isolated (07/20/2023)   Social Connection and Isolation Panel [NHANES]    Frequency of Communication with Friends and Family: Twice a week    Frequency of Social Gatherings with Friends and Family: Three times a week    Attends Religious Services: Never    Active Member of Clubs or Organizations: No    Attends Banker Meetings: Never    Marital Status: Divorced    Tobacco Counseling Counseling given: Not Answered   Clinical Intake:  Pre-visit preparation completed: Yes  Pain : No/denies pain     BMI -  recorded: 21.28 Nutritional Status: BMI of 19-24  Normal Nutritional Risks: None Diabetes: No  How often do you need to have someone help you when you read instructions, pamphlets, or other written materials from your doctor or pharmacy?: 1 - Never  Interpreter Needed?: No  Information entered by :: Theresa Mulligan LPN   Activities of Daily Living    07/20/2023   11:50 AM 08/06/2022    1:41 PM  In your present state of health, do you have any difficulty performing the following activities:  Hearing? 0 0  Vision? 0 0  Difficulty concentrating or making decisions? 0 0  Walking or climbing  stairs? 0 0  Dressing or bathing? 0 0  Doing errands, shopping? 0 0  Preparing Food and eating ? N N  Using the Toilet? N N  In the past six months, have you accidently leaked urine? N N  Do you have problems with loss of bowel control? N N  Managing your Medications? N N  Managing your Finances? N N  Housekeeping or managing your Housekeeping? N N    Patient Care Team: Elenore Paddy, NP as PCP - General (Nurse Practitioner) Jodelle Red, MD as PCP - Cardiology (Cardiology) Rachael Fee, MD (Gastroenterology) Tat, Octaviano Batty, DO (Neurology) Lynden Ang, NP (Obstetrics and Gynecology) Mateo Flow, MD (Ophthalmology)  Indicate any recent Medical Services you may have received from other than [facility] providers in the past year (date may be approximate).     Assessment:   This is a routine wellness examination for [place].  Hearing/Vision screen Hearing Screening - Comments:: Denies hearing difficulties   Vision Screening - Comments:: Wears rx glasses - up to date with routine eye exams with  Dr Benjamine Mola   Goals Addressed               This Visit's Progress     Increase physical activity (pt-stated)        Maintain activity.       Depression Screen    07/21/2023    8:45 AM 07/21/2023    8:43 AM 03/25/2023    8:48 AM 12/23/2022   10:44 AM 08/06/2022    1:39 PM  06/10/2022   10:38 AM 12/11/2021   10:41 AM  PHQ 2/9 Scores  PHQ - 2 Score 0 0 0 0 0 0 0  PHQ- 9 Score   0 0 0 0     Fall Risk    07/20/2023   11:50 AM 07/20/2023   11:06 AM 03/25/2023    8:48 AM 12/23/2022   10:44 AM 11/18/2022    9:13 AM  Fall Risk   Falls in the past year? 0 0 0 0 0  Number falls in past yr: 0 0 0 0 0  Injury with Fall? 0 0 0 0   Risk for fall due to :   No Fall Risks No Fall Risks   Follow up  Falls evaluation completed Falls evaluation completed Falls evaluation completed     MEDICARE RISK AT HOME: Medicare Risk at Home Any stairs in or around the home?: No If so, are there any without handrails?: No Home free of loose throw rugs in walkways, pet beds, electrical cords, etc?: Yes Adequate lighting in your home to reduce risk of falls?: Yes Life alert?: No Use of a cane, walker or w/c?: No Grab bars in the bathroom?: Yes Shower chair or bench in shower?: No Elevated toilet seat or a handicapped toilet?: Yes  TIMED UP AND GO:  Was the test performed?  No    Cognitive Function:        07/21/2023    8:48 AM 08/06/2022    1:41 PM  6CIT Screen  What Year? 0 points 0 points  What month? 0 points 0 points  What time? 0 points 0 points  Count back from 20 0 points 0 points  Months in reverse 0 points 0 points  Repeat phrase 0 points 0 points  Total Score 0 points 0 points    Immunizations Immunization History  Administered Date(s) Administered   Fluad Quad(high Dose 65+) 07/04/2019   Influenza Split 10/12/2011,  07/27/2013   Influenza, High Dose Seasonal PF 06/29/2017, 08/02/2018   Influenza,inj,Quad PF,6+ Mos 07/18/2014   Influenza-Unspecified 07/31/2015, 08/01/2020, 08/06/2022   PFIZER(Purple Top)SARS-COV-2 Vaccination 11/21/2019, 12/12/2019, 08/28/2020   Pfizer Covid-19 Vaccine Bivalent Booster 64yrs & up 07/29/2022, 07/29/2022   Pneumococcal Conjugate-13 06/14/2016   Pneumococcal Polysaccharide-23 08/21/2018   Td 11/02/2003   Tdap 11/06/2013    Zoster Recombinant(Shingrix) 08/31/2019, 11/01/2019   Zoster, Live 03/28/2013    TDAP status: Up to date  Flu Vaccine status: Due, Education has been provided regarding the importance of this vaccine. Advised may receive this vaccine at local pharmacy or Health Dept. Aware to provide a copy of the vaccination record if obtained from local pharmacy or Health Dept. Verbalized acceptance and understanding.  Pneumococcal vaccine status: Up to date  Covid-19 vaccine status: Declined, Education has been provided regarding the importance of this vaccine but patient still declined. Advised may receive this vaccine at local pharmacy or Health Dept.or vaccine clinic. Aware to provide a copy of the vaccination record if obtained from local pharmacy or Health Dept. Verbalized acceptance and understanding.  Qualifies for Shingles Vaccine? Yes   Zostavax completed Yes   Shingrix Completed?: Yes  Screening Tests Health Maintenance  Topic Date Due   INFLUENZA VACCINE  06/02/2023   COVID-19 Vaccine (5 - 2023-24 season) 07/03/2023   DTaP/Tdap/Td (3 - Td or Tdap) 11/07/2023   Medicare Annual Wellness (AWV)  07/20/2024   MAMMOGRAM  08/18/2024   Colonoscopy  06/11/2026   Pneumonia Vaccine 32+ Years old  Completed   DEXA SCAN  Completed   Hepatitis C Screening  Completed   Zoster Vaccines- Shingrix  Completed   HPV VACCINES  Aged Out    Health Maintenance  Health Maintenance Due  Topic Date Due   INFLUENZA VACCINE  06/02/2023   COVID-19 Vaccine (5 - 2023-24 season) 07/03/2023    Colorectal cancer screening: Type of screening: Colonoscopy. Completed 06/11/16. Repeat every 10 years  Mammogram status: Completed 08/18/22. Repeat every year  Bone Density status: Completed 08/18/22. Results reflect: Bone density results: OSTEOPOROSIS. Repeat every   years.    Additional Screening:  Hepatitis C Screening: does qualify; Completed 10/28/15  Vision Screening: Recommended annual ophthalmology  exams for early detection of glaucoma and other disorders of the eye. Is the patient up to date with their annual eye exam?  Yes  Who is the provider or what is the name of the office in which the patient attends annual eye exams? Dr Benjamine Mola If pt is not established with a provider, would they like to be referred to a provider to establish care? No .   Dental Screening: Recommended annual dental exams for proper oral hygiene    Community Resource Referral / Chronic Care Management:  CRR required this visit?  No   CCM required this visit?  No     Plan:     I have personally reviewed and noted the following in the patient's chart:   Medical and social history Use of alcohol, tobacco or illicit drugs  Current medications and supplements including opioid prescriptions. Patient is not currently taking opioid prescriptions. Functional ability and status Nutritional status Physical activity Advanced directives List of other physicians Hospitalizations, surgeries, and ER visits in previous 12 months Vitals Screenings to include cognitive, depression, and falls Referrals and appointments  In addition, I have reviewed and discussed with patient certain preventive protocols, quality metrics, and best practice recommendations. A written personalized care plan for preventive services as well as general  preventive health recommendations were provided to patient.     Tillie Rung, LPN   [id number]   After Visit Summary: (MyChart) Due to this being a telephonic visit, the after visit summary with patients personalized plan was offered to patient via MyChart   Nurse Notes: None

## 2023-08-20 ENCOUNTER — Other Ambulatory Visit: Payer: Self-pay | Admitting: Nurse Practitioner

## 2023-08-20 ENCOUNTER — Other Ambulatory Visit: Payer: Self-pay | Admitting: Neurology

## 2023-08-20 DIAGNOSIS — R5383 Other fatigue: Secondary | ICD-10-CM

## 2023-08-20 DIAGNOSIS — F411 Generalized anxiety disorder: Secondary | ICD-10-CM

## 2023-08-20 DIAGNOSIS — G250 Essential tremor: Secondary | ICD-10-CM

## 2023-08-24 DIAGNOSIS — Z1231 Encounter for screening mammogram for malignant neoplasm of breast: Secondary | ICD-10-CM | POA: Diagnosis not present

## 2023-08-24 LAB — HM MAMMOGRAPHY

## 2023-08-25 ENCOUNTER — Encounter: Payer: Self-pay | Admitting: Nurse Practitioner

## 2023-11-15 ENCOUNTER — Other Ambulatory Visit (HOSPITAL_BASED_OUTPATIENT_CLINIC_OR_DEPARTMENT_OTHER): Payer: Self-pay | Admitting: Family

## 2023-11-15 ENCOUNTER — Other Ambulatory Visit: Payer: Self-pay | Admitting: Neurology

## 2023-11-15 DIAGNOSIS — E7800 Pure hypercholesterolemia, unspecified: Secondary | ICD-10-CM

## 2023-11-15 DIAGNOSIS — I2510 Atherosclerotic heart disease of native coronary artery without angina pectoris: Secondary | ICD-10-CM

## 2023-11-15 DIAGNOSIS — I700 Atherosclerosis of aorta: Secondary | ICD-10-CM

## 2023-11-15 DIAGNOSIS — G250 Essential tremor: Secondary | ICD-10-CM

## 2024-02-20 ENCOUNTER — Other Ambulatory Visit: Payer: Self-pay | Admitting: Neurology

## 2024-02-20 DIAGNOSIS — G250 Essential tremor: Secondary | ICD-10-CM

## 2024-05-20 ENCOUNTER — Other Ambulatory Visit: Payer: Self-pay | Admitting: Neurology

## 2024-05-20 ENCOUNTER — Other Ambulatory Visit: Payer: Self-pay | Admitting: Nurse Practitioner

## 2024-05-20 DIAGNOSIS — R5383 Other fatigue: Secondary | ICD-10-CM

## 2024-05-20 DIAGNOSIS — G250 Essential tremor: Secondary | ICD-10-CM

## 2024-05-20 DIAGNOSIS — F411 Generalized anxiety disorder: Secondary | ICD-10-CM

## 2024-06-25 DIAGNOSIS — H524 Presbyopia: Secondary | ICD-10-CM | POA: Diagnosis not present

## 2024-06-25 DIAGNOSIS — H3554 Dystrophies primarily involving the retinal pigment epithelium: Secondary | ICD-10-CM | POA: Diagnosis not present

## 2024-06-25 DIAGNOSIS — H26492 Other secondary cataract, left eye: Secondary | ICD-10-CM | POA: Diagnosis not present

## 2024-06-25 DIAGNOSIS — H04123 Dry eye syndrome of bilateral lacrimal glands: Secondary | ICD-10-CM | POA: Diagnosis not present

## 2024-06-25 DIAGNOSIS — H25811 Combined forms of age-related cataract, right eye: Secondary | ICD-10-CM | POA: Diagnosis not present

## 2024-07-17 NOTE — Progress Notes (Unsigned)
 Assessment/Plan:    1.  Essential Tremor  -pt back on topamax  and she thinks that it is helping.  She would like to continue on the medication.  It is not causing cognitive deficits.  She is on 50 mg , 2 po bid.  Doesn't want to change to the 100 mg pills  -not interested in focused ultrasound or dbs  -future options:gabapentin  or zonisamide.  2.  Pts medication was refilled x 1 year.  After that, pt would prefer getting refills from pcp.  Happy to see pt prn  Subjective:   [name] was seen today in follow up for essential tremor.  My previous records were reviewed prior to todays visit.  Patient remains on topiramate , 100 mg twice per day.  Her tremor has been stable.  The pills are nice.   She has had no falls since last visit.  No kidney stones.  No cognitive trouble.  Current movement disorder medications: Topamax , 50 mg, 2 tablets twice per day (she has not wanted to change to the 100 mg tablets)  PREVIOUS MEDICATIONS: Primidone  (150 mg twice a day); propranolol ; topiramate   ALLERGIES:   Allergies  Allergen Reactions   Augmentin  [Amoxicillin -Pot Clavulanate] Other (See Comments)    Diarrhea   Azithromycin  Diarrhea   Codeine Nausea Only    REACTION: nausea   Hydrocodone-Acetaminophen Nausea Only   Morphine And Codeine Nausea Only    Intense nausea   Primidone      CURRENT MEDICATIONS:  Outpatient Encounter Medications as of 07/19/2024  Medication Sig   Cholecalciferol (VITAMIN D3) 1000 UNITS CAPS Take by mouth daily.   mirtazapine  (REMERON ) 15 MG tablet TAKE 1 TO 2 TABLETS BY MOUTH EVERY EVENING   polyethylene glycol powder (GLYCOLAX/MIRALAX) 17 GM/SCOOP powder Take 17 g by mouth as needed.    rosuvastatin  (CRESTOR ) 20 MG tablet TAKE 1 TABLET(20 MG) BY MOUTH DAILY   [DISCONTINUED] topiramate  (TOPAMAX ) 50 MG tablet TAKE 2 TABLETS BY MOUTH TWICE DAILY.   topiramate  (TOPAMAX ) 50 MG tablet Take 2 tablets (100 mg total) by mouth 2 (two) times daily.   No  facility-administered encounter medications on file as of 07/19/2024.     Objective:    PHYSICAL EXAMINATION:    VITALS:   Vitals:   07/19/24 0909  BP: 118/78  Pulse: 72  SpO2: 98%  Weight: 124 lb 12.8 oz (56.6 kg)  Height: 5' 4 (1.626 m)     GEN:  The patient appears stated age and is in NAD. HEENT:  Normocephalic, atraumatic.  The mucous membranes are moist. The superficial temporal arteries are without ropiness or tenderness. Cv:  RRR Lungs:  CTAB Neck:  no bruits Neurological examination:  Orientation: The patient is alert and oriented x3. Cranial nerves: There is good facial symmetry. The speech is fluent and clear. Soft palate rises symmetrically and there is no tongue deviation. Hearing is intact to conversational tone. Sensation: Sensation is intact to light touch throughout Motor: Strength is at least antigravity x4.  Movement examination: Tone: There is normal tone in the UE/LE Abnormal movements: she still has a moderate postural tremor on the right and min on the left   Archimedes spirals are stable Coordination:  There is no decremation with RAM's Gait and Station: ambulates well in hall I have reviewed and interpreted the following labs independently   Chemistry      Component Value Date/Time   NA 140 06/03/2022 1014   K 4.0 06/03/2022 1014   CL 106  06/03/2022 1014   CO2 26 06/03/2022 1014   BUN 14 06/03/2022 1014   CREATININE 0.84 06/03/2022 1014      Component Value Date/Time   CALCIUM  9.5 06/03/2022 1014   ALKPHOS 63 01/19/2023 0950   AST 31 01/19/2023 0950   ALT 41 (H) 01/19/2023 0950   BILITOT 0.4 01/19/2023 0950      Lab Results  Component Value Date   WBC 8.0 11/06/2021   HGB 14.6 11/06/2021   HCT 44.4 11/06/2021   MCV 91.8 11/06/2021   PLT 272.0 11/06/2021   Lab Results  Component Value Date   TSH 2.93 11/06/2021     Chemistry      Component Value Date/Time   NA 140 06/03/2022 1014   K 4.0 06/03/2022 1014   CL 106  06/03/2022 1014   CO2 26 06/03/2022 1014   BUN 14 06/03/2022 1014   CREATININE 0.84 06/03/2022 1014      Component Value Date/Time   CALCIUM  9.5 06/03/2022 1014   ALKPHOS 63 01/19/2023 0950   AST 31 01/19/2023 0950   ALT 41 (H) 01/19/2023 0950   BILITOT 0.4 01/19/2023 0950       Cc:  Elnor Lauraine BRAVO, NP

## 2024-07-19 ENCOUNTER — Ambulatory Visit: Payer: Medicare PPO | Admitting: Neurology

## 2024-07-19 ENCOUNTER — Encounter: Payer: Self-pay | Admitting: Neurology

## 2024-07-19 DIAGNOSIS — G250 Essential tremor: Secondary | ICD-10-CM | POA: Diagnosis not present

## 2024-07-19 MED ORDER — TOPIRAMATE 50 MG PO TABS: 100.0000 mg | ORAL_TABLET | Freq: Two times a day (BID) | ORAL | 3 refills | Status: DC

## 2024-07-24 ENCOUNTER — Ambulatory Visit (INDEPENDENT_AMBULATORY_CARE_PROVIDER_SITE_OTHER): Payer: Medicare PPO

## 2024-07-24 VITALS — BP 111/75 | HR 75 | Ht 64.0 in | Wt 123.6 lb

## 2024-07-24 DIAGNOSIS — Z0000 Encounter for general adult medical examination without abnormal findings: Secondary | ICD-10-CM

## 2024-07-24 NOTE — Patient Instructions (Addendum)
 [name],  Thank you for taking the time for your Medicare Wellness Visit. I appreciate your continued commitment to your health goals. Please review the care plan we discussed, and feel free to reach out if I can assist you further.  Medicare recommends these wellness visits once per year to help you and your care team stay ahead of potential health issues. These visits are designed to focus on prevention, allowing your provider to concentrate on managing your acute and chronic conditions during your regular appointments.  Please note that Annual Wellness Visits do not include a physical exam. Some assessments may be limited, especially if the visit was conducted virtually. If needed, we may recommend a separate in-person follow-up with your provider.  Ongoing Care Seeing your primary care provider every 3 to 6 months helps us  monitor your health and provide consistent, personalized care.   Referrals If a referral was made during today's visit and you haven't received any updates within two weeks, please contact the referred provider directly to check on the status.  Recommended Screenings:  Health Maintenance  Topic Date Due   DTaP/Tdap/Td vaccine (3 - Td or Tdap) 11/07/2023   Flu Shot  06/01/2024   COVID-19 Vaccine (5 - 2025-26 season) 07/02/2024   Medicare Annual Wellness Visit  07/24/2025   Colon Cancer Screening  06/11/2026   Pneumococcal Vaccine for age over 1  Completed   DEXA scan (bone density measurement)  Completed   Hepatitis C Screening  Completed   Zoster (Shingles) Vaccine  Completed   HPV Vaccine  Aged Out   Meningitis B Vaccine  Aged Out   Breast Cancer Screening  Discontinued       07/19/2024    9:02 AM  Advanced Directives  Does Patient Have a Medical Advance Directive? Yes  Type of Advance Directive Living will   Advance Care Planning is important because it: Ensures you receive medical care that aligns with your values, goals, and preferences. Provides  guidance to your family and loved ones, reducing the emotional burden of decision-making during critical moments.  Vision: Annual vision screenings are recommended for early detection of glaucoma, cataracts, and diabetic retinopathy. These exams can also reveal signs of chronic conditions such as diabetes and high blood pressure.  Dental: Annual dental screenings help detect early signs of oral cancer, gum disease, and other conditions linked to overall health, including heart disease and diabetes.

## 2024-07-24 NOTE — Progress Notes (Signed)
 Subjective:  Please attest and cosign this visit due to patients primary care provider not being in the office at the time the visit was completed.  (Pt of Lauraine Pereyra, NP)   [name] is a 73 y.o. who presents for a Medicare Wellness preventive visit.  As a reminder, Annual Wellness Visits don't include a physical exam, and some assessments may be limited, especially if this visit is performed virtually. We may recommend an in-person follow-up visit with your provider if needed.  Visit Complete: In person  Persons Participating in Visit: Patient.  AWV Questionnaire: No: Patient Medicare AWV questionnaire was not completed prior to this visit.  Cardiac Risk Factors include: advanced age (>98men, >46 women);dyslipidemia     Objective:    Today's Vitals   07/24/24 0811  BP: 111/75  Pulse: 75  SpO2: 98%  Weight: 123 lb 9.6 oz (56.1 kg)  Height: 5' 4 (1.626 m)   Body mass index is 21.22 kg/m.     07/19/2024    9:02 AM 07/21/2023    8:47 AM 07/20/2023   11:05 AM 11/18/2022    9:13 AM 08/06/2022    1:41 PM 08/14/2021   10:48 AM 02/10/2021    2:56 PM  Advanced Directives  Does Patient Have a Medical Advance Directive? Yes Yes Yes Yes Yes Yes Yes  Type of Advance Directive Living will Healthcare Power of [place];Living will Living will Living will Healthcare Power of [place];Living will Living will Healthcare Power of [place];Living will  Copy of Healthcare Power of Attorney in Chart?  No - copy requested   No - copy requested      Current Medications (verified) Outpatient Encounter Medications as of 07/24/2024  Medication Sig   Cholecalciferol (VITAMIN D3) 1000 UNITS CAPS Take by mouth daily.   mirtazapine  (REMERON ) 15 MG tablet TAKE 1 TO 2 TABLETS BY MOUTH EVERY EVENING   polyethylene glycol powder (GLYCOLAX/MIRALAX) 17 GM/SCOOP powder Take 17 g by mouth as needed.    rosuvastatin  (CRESTOR ) 20 MG tablet TAKE 1 TABLET(20 MG) BY MOUTH DAILY   topiramate  (TOPAMAX ) 50  MG tablet Take 2 tablets (100 mg total) by mouth 2 (two) times daily.   No facility-administered encounter medications on file as of 07/24/2024.    Allergies (verified) Augmentin  [amoxicillin -pot clavulanate], Azithromycin , Codeine, Hydrocodone-acetaminophen, Morphine and codeine, and Primidone    History: Past Medical History:  Diagnosis Date   Actinic keratosis    ALLERGIC RHINITIS    ANEMIA-NOS    Anxiety    Cataract 2014   left cataract extraction with lens implant   DEPRESSION    FIBROIDS, UTERUS    HYPERLIPIDEMIA    Macular degeneration of both eyes 2012   mild   Malignant GIST (gastrointestinal stromal tumor) (HCC) 2009   resection 2-3cm gastric GIST 11/2008; annual EGD w/o recurrence 07/2011 - follow clinically   OSTEOPOROSIS 2002   on bisphos   Sarcoidosis 2009   Past Surgical History:  Procedure Laterality Date   BREAST SURGERY     biospy   CATARACT EXTRACTION Left 07/2013   dr cleatus   Child birth  1977   COLONOSCOPY  2011   STOMACH SURGERY  11/2008   2-3cm GIST excision   TONSILLECTOMY  1957   TUBAL LIGATION     UPPER GASTROINTESTINAL ENDOSCOPY     Family History  Problem Relation Age of Onset   Allergies Mother    Colon cancer Mother    Tremor Mother    Hodgkin's lymphoma Father 51  Dermatomyositis Sister    Allergies Daughter    Breast cancer Maternal Aunt    Prostate cancer Maternal Uncle    Esophageal cancer Maternal Grandfather    Stomach cancer Neg Hx    Social History   Socioeconomic History   Marital status: Divorced    Spouse name: Not on file   Number of children: 1   Years of education: Not on file   Highest education level: Bachelor's degree (e.g., BA, AB, BS)  Occupational History   Occupation: retired    Associate Professor: RETIRED    Comment: Stage manager  Tobacco Use   Smoking status: Never   Smokeless tobacco: Never  Vaping Use   Vaping status: Never Used  Substance and Sexual Activity   Alcohol use: Yes    Alcohol/week: 3.0 -  4.0 standard drinks of alcohol    Types: 3 - 4 Glasses of wine per week    Comment: 2glasses twice a month   Drug use: No   Sexual activity: Not Currently  Other Topics Concern   Not on file  Social History Narrative   Divorced, lives alone. Pt has 1 dtr. 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   Fun/Hobby: Reading, movies and genology    Social Drivers of Health   Financial Resource Strain: Low Risk  (07/24/2024)   Overall Financial Resource Strain (CARDIA)    Difficulty of Paying Living Expenses: Not hard at all  Food Insecurity: No Food Insecurity (07/24/2024)   Hunger Vital Sign    Worried About Running Out of Food in the Last Year: Never true    Ran Out of Food in the Last Year: Never true  Transportation Needs: No Transportation Needs (07/24/2024)   PRAPARE - Administrator, Civil Service (Medical): No    Lack of Transportation (Non-Medical): No  Physical Activity: Insufficiently Active (07/24/2024)   Exercise Vital Sign    Days of Exercise per Week: 2 days    Minutes of Exercise per Session: 20 min  Stress: No Stress Concern Present (07/24/2024)   [employer] of Occupational Health - Occupational Stress Questionnaire    Feeling of Stress: Not at all  Social Connections: Socially Isolated (07/24/2024)   Social Connection and Isolation Panel    Frequency of Communication with Friends and Family: Never    Frequency of Social Gatherings with Friends and Family: Three times a week    Attends Religious Services: Never    Active Member of Clubs or Organizations: No    Attends Banker Meetings: Never    Marital Status: Divorced    Tobacco Counseling Counseling given: No    Clinical Intake:  Pre-visit preparation completed: Yes  Pain : No/denies pain     BMI - recorded: 21.22 Nutritional Status: BMI of 19-24  Normal Nutritional Risks: None Diabetes: No  Lab Results  Component Value Date   HGBA1C 5.5 11/06/2021     How often  do you need to have someone help you when you read instructions, pamphlets, or other written materials from your doctor or pharmacy?: 1 - Never  Interpreter Needed?: No  Information entered by :: [name], [name]   Activities of Daily Living     07/24/2024    8:14 AM  In your present state of health, do you have any difficulty performing the following activities:  Hearing? 0  Vision? 0  Difficulty concentrating or making decisions? 0  Walking or climbing stairs? 0  Dressing or bathing? 0  Doing errands, shopping? 0  Preparing Food and eating ? N  Using the Toilet? N  In the past six months, have you accidently leaked urine? N  Do you have problems with loss of bowel control? N  Managing your Medications? N  Managing your Finances? N  Housekeeping or managing your Housekeeping? N    Patient Care Team: Elnor Lauraine BRAVO, NP as PCP - General (Nurse Practitioner) Lonni Slain, MD as PCP - Cardiology (Cardiology) Teressa Toribio SQUIBB, MD (Inactive) (Gastroenterology) Tat, Asberry RAMAN, DO (Neurology) Stuart Norris, NP (Obstetrics and Gynecology) Fleeta Zerita DASEN, MD as Consulting Physician (Ophthalmology)  I have updated your Care Teams any recent Medical Services you may have received from other providers in the past year.     Assessment:   This is a routine wellness examination for [name].  Hearing/Vision screen Hearing Screening - Comments:: Denies hearing difficulties   Vision Screening - Comments:: Wears rx glasses - up to date with routine eye exams with Dr Fleeta   Goals Addressed               This Visit's Progress     Patient Stated (pt-stated)        Patient stated she plans to continue walking       Depression Screen     07/24/2024    8:15 AM 07/21/2023    8:45 AM 07/21/2023    8:43 AM 03/25/2023    8:48 AM 12/23/2022   10:44 AM 08/06/2022    1:39 PM 06/10/2022   10:38 AM  PHQ 2/9 Scores  PHQ - 2 Score 0 0 0 0 0 0 0  PHQ- 9 Score 0   0 0 0 0     Fall Risk     07/24/2024    8:14 AM 07/19/2024    9:02 AM 07/20/2023   11:50 AM 07/20/2023   11:06 AM 03/25/2023    8:48 AM  Fall Risk   Falls in the past year? 0 0 0 0 0  Number falls in past yr: 0 0 0 0 0  Injury with Fall? 0 0 0 0 0  Risk for fall due to : No Fall Risks    No Fall Risks  Follow up Falls evaluation completed;Falls prevention discussed Falls evaluation completed  Falls evaluation completed Falls evaluation completed    MEDICARE RISK AT HOME:  Medicare Risk at Home Any stairs in or around the home?: No If so, are there any without handrails?: No Home free of loose throw rugs in walkways, pet beds, electrical cords, etc?: Yes Adequate lighting in your home to reduce risk of falls?: Yes Life alert?: No Use of a cane, walker or w/c?: No Grab bars in the bathroom?: Yes Shower chair or bench in shower?: No Elevated toilet seat or a handicapped toilet?: Yes  TIMED UP AND GO:  Was the test performed?  No  Cognitive Function: 6CIT completed        07/24/2024    8:18 AM 07/21/2023    8:48 AM 08/06/2022    1:41 PM  6CIT Screen  What Year? 0 points 0 points 0 points  What month? 0 points 0 points 0 points  What time? 0 points 0 points 0 points  Count back from 20 0 points 0 points 0 points  Months in reverse 0 points 0 points 0 points  Repeat phrase 0 points 0 points 0 points  Total Score 0 points 0 points 0 points    Immunizations  Immunization History  Administered Date(s) Administered   Fluad Quad(high Dose 65+) 07/04/2019   INFLUENZA, HIGH DOSE SEASONAL PF 06/29/2017, 08/02/2018   Influenza Split 10/12/2011, 07/27/2013   Influenza,inj,Quad PF,6+ Mos 07/18/2014   Influenza-Unspecified 07/31/2015, 08/01/2020, 08/06/2022   PFIZER(Purple Top)SARS-COV-2 Vaccination 11/21/2019, 12/12/2019, 08/28/2020   Pfizer Covid-19 Vaccine Bivalent Booster 60yrs & up 07/29/2022, 07/29/2022   Pneumococcal Conjugate-13 06/14/2016   Pneumococcal Polysaccharide-23 08/21/2018    Td 11/02/2003   Tdap 11/06/2013   Zoster Recombinant(Shingrix) 08/31/2019, 11/01/2019   Zoster, Live 03/28/2013    Screening Tests Health Maintenance  Topic Date Due   DTaP/Tdap/Td (3 - Td or Tdap) 11/07/2023   Influenza Vaccine  06/01/2024   COVID-19 Vaccine (5 - 2025-26 season) 07/02/2024   Medicare Annual Wellness (AWV)  07/24/2025   Colonoscopy  06/11/2026   Pneumococcal Vaccine: 50+ Years  Completed   DEXA SCAN  Completed   Hepatitis C Screening  Completed   Zoster Vaccines- Shingrix  Completed   HPV VACCINES  Aged Out   Meningococcal B Vaccine  Aged Out   Mammogram  Discontinued    Health Maintenance Items Addressed: 07/24/2024  Additional Screening:  Vision Screening: Recommended annual ophthalmology exams for early detection of glaucoma and other disorders of the eye. Is the patient up to date with their annual eye exam?  Yes  Who is the provider or what is the name of the office in which the patient attends annual eye exams? Dr. KYM Salinas  Dental Screening: Recommended annual dental exams for proper oral hygiene  Community Resource Referral / Chronic Care Management: CRR required this visit?  No   CCM required this visit?  No   Plan:    I have personally reviewed and noted the following in the patient's chart:   Medical and social history Use of alcohol, tobacco or illicit drugs  Current medications and supplements including opioid prescriptions. Patient is not currently taking opioid prescriptions. Functional ability and status Nutritional status Physical activity Advanced directives List of other physicians Hospitalizations, surgeries, and ER visits in previous 12 months Vitals Screenings to include cognitive, depression, and falls Referrals and appointments  In addition, I have reviewed and discussed with patient certain preventive protocols, quality metrics, and best practice recommendations. A written personalized care plan for preventive services  as well as general preventive health recommendations were provided to patient.   [name], [name]   07/24/2024   After Visit Summary: (In Person-Declined) Patient declined AVS at this time.  Notes: Scheduled a 1-yr Physical w/PCP for 08/2024.

## 2024-08-20 ENCOUNTER — Other Ambulatory Visit: Payer: Self-pay | Admitting: Neurology

## 2024-08-20 DIAGNOSIS — G250 Essential tremor: Secondary | ICD-10-CM

## 2024-08-23 ENCOUNTER — Ambulatory Visit: Admitting: Nurse Practitioner

## 2024-08-23 VITALS — BP 110/76 | HR 72 | Temp 97.9°F | Ht 64.0 in | Wt 125.0 lb

## 2024-08-23 DIAGNOSIS — H6123 Impacted cerumen, bilateral: Secondary | ICD-10-CM

## 2024-08-23 DIAGNOSIS — E782 Mixed hyperlipidemia: Secondary | ICD-10-CM | POA: Diagnosis not present

## 2024-08-23 DIAGNOSIS — G250 Essential tremor: Secondary | ICD-10-CM

## 2024-08-23 DIAGNOSIS — D869 Sarcoidosis, unspecified: Secondary | ICD-10-CM

## 2024-08-23 DIAGNOSIS — R011 Cardiac murmur, unspecified: Secondary | ICD-10-CM

## 2024-08-23 DIAGNOSIS — Z0001 Encounter for general adult medical examination with abnormal findings: Secondary | ICD-10-CM | POA: Diagnosis not present

## 2024-08-23 LAB — CBC
HCT: 47.3 % — ABNORMAL HIGH (ref 36.0–46.0)
Hemoglobin: 15.4 g/dL — ABNORMAL HIGH (ref 12.0–15.0)
MCHC: 32.5 g/dL (ref 30.0–36.0)
MCV: 93.3 fl (ref 78.0–100.0)
Platelets: 260 K/uL (ref 150.0–400.0)
RBC: 5.07 Mil/uL (ref 3.87–5.11)
RDW: 14.4 % (ref 11.5–15.5)
WBC: 8.8 K/uL (ref 4.0–10.5)

## 2024-08-23 LAB — COMPREHENSIVE METABOLIC PANEL WITH GFR
ALT: 21 U/L (ref 0–35)
AST: 20 U/L (ref 0–37)
Albumin: 4.4 g/dL (ref 3.5–5.2)
Alkaline Phosphatase: 57 U/L (ref 39–117)
BUN: 13 mg/dL (ref 6–23)
CO2: 26 meq/L (ref 19–32)
Calcium: 9.6 mg/dL (ref 8.4–10.5)
Chloride: 107 meq/L (ref 96–112)
Creatinine, Ser: 0.75 mg/dL (ref 0.40–1.20)
GFR: 79.08 mL/min (ref >60.00)
Glucose, Bld: 102 mg/dL — ABNORMAL HIGH (ref 70–99)
Potassium: 3.7 meq/L (ref 3.5–5.1)
Sodium: 141 meq/L (ref 135–145)
Total Bilirubin: 0.5 mg/dL (ref 0.2–1.2)
Total Protein: 7.7 g/dL (ref 6.0–8.3)

## 2024-08-23 LAB — HEMOGLOBIN A1C: Hgb A1c MFr Bld: 5.7 % (ref 4.6–6.5)

## 2024-08-23 LAB — LIPID PANEL
Cholesterol: 158 mg/dL (ref 0–200)
HDL: 51.8 mg/dL (ref >39.00)
LDL Cholesterol: 76 mg/dL (ref 0–99)
NonHDL: 106.69
Total CHOL/HDL Ratio: 3
Triglycerides: 151 mg/dL — ABNORMAL HIGH (ref 0.0–149.0)
VLDL: 30.2 mg/dL (ref 0.0–40.0)

## 2024-08-23 LAB — TSH: TSH: 1.8 u[IU]/mL (ref 0.35–5.50)

## 2024-08-23 NOTE — Progress Notes (Signed)
 Established Patient Office Visit  Subjective   Patient ID: [name], female    DOB: 30-Nov-1950  Age: 73 y.o. MRN: [id number]  Chief Complaint  Patient presents with   Annual Exam    Discussed the use of AI scribe software for clinical note transcription with the patient, who gave verbal consent to proceed.  History of Present Illness [name] is a 73 year old female who presents for an annual physical exam.  Ear fullness - Sensation of fullness in both ear  Lower extremity tenderness - Tenderness in the anterior aspect of both legs - Tenderness is noticeable with applied pressure - No impact on ambulation or daily activities - Has been present for >/= 10years  Tremor - Essential tremor managed with topiramate  - No changes in tremor symptoms - Was being treated by neurology but would like to transition to management by myself       Review of Systems  Constitutional:  Negative for chills, fever and weight loss.  Respiratory:  Negative for cough and shortness of breath.   Cardiovascular:  Negative for chest pain and palpitations.  Gastrointestinal:  Negative for abdominal pain and blood in stool.  Genitourinary:  Negative for dysuria and hematuria.  Psychiatric/Behavioral:  Negative for depression. The patient is not nervous/anxious.       Objective:     BP 110/76   Pulse 72   Temp 97.9 F (36.6 C) (Temporal)   Ht 5' 4 (1.626 m)   Wt 125 lb (56.7 kg)   SpO2 97%   BMI 21.46 kg/m  BP Readings from Last 3 Encounters:  08/23/24 110/76  07/24/24 111/75  07/19/24 118/78   Wt Readings from Last 3 Encounters:  08/23/24 125 lb (56.7 kg)  07/24/24 123 lb 9.6 oz (56.1 kg)  07/19/24 124 lb 12.8 oz (56.6 kg)      Physical Exam Vitals reviewed.  Constitutional:      Appearance: Normal appearance.  HENT:     Head: Normocephalic and atraumatic.     Right Ear: Tympanic membrane, ear canal and external ear normal.     Left Ear: Tympanic  membrane, ear canal and external ear normal.  Eyes:     General:        Right eye: No discharge.        Left eye: No discharge.     Extraocular Movements: Extraocular movements intact.     Conjunctiva/sclera: Conjunctivae normal.     Pupils: Pupils are equal, round, and reactive to light.  Neck:     Vascular: No carotid bruit.  Cardiovascular:     Rate and Rhythm: Normal rate and regular rhythm.     Pulses: Normal pulses.     Heart sounds: Murmur heard.  Pulmonary:     Effort: Pulmonary effort is normal.     Breath sounds: Normal breath sounds.  Chest:     Comments: Deferred per patient preference Abdominal:     General: Abdomen is flat. Bowel sounds are normal. There is no distension.     Palpations: Abdomen is soft. There is no mass.     Tenderness: There is no abdominal tenderness.  Musculoskeletal:        General: No tenderness.     Cervical back: Neck supple. No muscular tenderness.     Right lower leg: No edema.     Left lower leg: No edema.  Lymphadenopathy:     Cervical: No cervical adenopathy.     Upper Body:  Right upper body: No supraclavicular adenopathy.     Left upper body: No supraclavicular adenopathy.  Skin:    General: Skin is warm and dry.  Neurological:     General: No focal deficit present.     Mental Status: She is alert and oriented to person, place, and time.     Motor: No weakness.     Gait: Gait normal.  Psychiatric:        Mood and Affect: Mood normal.        Behavior: Behavior normal.        Judgment: Judgment normal.      No results found for any visits on 08/23/24.    The 10-year ASCVD risk score (Arnett DK, et al., 2019) is: 9.3%    Assessment & Plan:   Problem List Items Addressed This Visit       Nervous and Auditory   Cerumen impaction    Cerumen impaction, bilateral ears Bilateral ear fullness due to cerumen impaction. - Perform ear lavage to remove cerumen.      Essential tremor   Essential tremor Essential  tremor managed with topiramate . Management transitioned to primary care. - Continue topiramate  100 mg oral twice daily. - Monitor for side effects such as cognitive impairment or sedation. - Follow up in six months to reassess tremor management.      Relevant Orders   CBC   Comprehensive metabolic panel with GFR   Hemoglobin A1c   Lipid panel   TSH     Other   Hyperlipidemia - Primary   Relevant Orders   CBC   Comprehensive metabolic panel with GFR   Hemoglobin A1c   Lipid panel   TSH   Sarcoidosis   Possible peripheral neuropathy, bilateral legs Chronic tenderness in bilateral legs, likely neuropathy. No significant impact on function. Patient declined further testing. - Monitor symptoms and report any progression or new symptoms.      Relevant Orders   CBC   Comprehensive metabolic panel with GFR   Hemoglobin A1c   Lipid panel   TSH   Murmur   Relevant Orders   CBC   Comprehensive metabolic panel with GFR   Hemoglobin A1c   Lipid panel   TSH   Encounter for general adult medical examination with abnormal findings   Annual physical examination Routine annual physical examination with vaccinations up to date. Mammogram scheduled. Colonoscopy due at age 29. - Perform routine annual physical examination. - Review results of upcoming mammogram. - Handout regarding health maintenance provided      Relevant Orders   CBC   Comprehensive metabolic panel with GFR   Hemoglobin A1c   Lipid panel   TSH  Assessment and Plan Assessment & Plan Annual physical examination Routine annual physical examination with vaccinations up to date. Mammogram scheduled. Colonoscopy due at age 50. - Perform routine annual physical examination. - Review results of upcoming mammogram. - Handout regarding health maintenance provided  Cerumen impaction, bilateral ears Bilateral ear fullness due to cerumen impaction. - Perform ear lavage to remove cerumen.  Possible peripheral  neuropathy, bilateral legs Chronic tenderness in bilateral legs, likely neuropathy. No significant impact on function. Patient declined further testing. - Monitor symptoms and report any progression or new symptoms.  Essential tremor Essential tremor managed with topiramate . Management transitioned to primary care. - Continue topiramate  100 mg oral twice daily. - Monitor for side effects such as cognitive impairment or sedation. - Follow up in six months to reassess tremor management.  Follow-Up Follow-up plan established for ongoing conditions and medication management. - Schedule follow-up appointment in six months to reassess tremor management and overall health.   Return in about 6 months (around 02/21/2025) for F/U with Lauraine.    Lauraine FORBES Pereyra, NP

## 2024-08-23 NOTE — Assessment & Plan Note (Signed)
 Annual physical examination Routine annual physical examination with vaccinations up to date. Mammogram scheduled. Colonoscopy due at age 73. - Perform routine annual physical examination. - Review results of upcoming mammogram. - Handout regarding health maintenance provided

## 2024-08-23 NOTE — Assessment & Plan Note (Signed)
 Possible peripheral neuropathy, bilateral legs Chronic tenderness in bilateral legs, likely neuropathy. No significant impact on function. Patient declined further testing. - Monitor symptoms and report any progression or new symptoms.

## 2024-08-23 NOTE — Assessment & Plan Note (Signed)
 Essential tremor Essential tremor managed with topiramate . Management transitioned to primary care. - Continue topiramate  100 mg oral twice daily. - Monitor for side effects such as cognitive impairment or sedation. - Follow up in six months to reassess tremor management.

## 2024-08-23 NOTE — Assessment & Plan Note (Signed)
  Cerumen impaction, bilateral ears Bilateral ear fullness due to cerumen impaction. - Perform ear lavage to remove cerumen.

## 2024-08-24 ENCOUNTER — Ambulatory Visit: Payer: Self-pay | Admitting: Nurse Practitioner

## 2024-08-29 DIAGNOSIS — Z1231 Encounter for screening mammogram for malignant neoplasm of breast: Secondary | ICD-10-CM | POA: Diagnosis not present

## 2024-08-29 LAB — HM MAMMOGRAPHY

## 2024-08-30 ENCOUNTER — Encounter: Payer: Self-pay | Admitting: Nurse Practitioner

## 2024-11-16 ENCOUNTER — Other Ambulatory Visit: Payer: Self-pay | Admitting: Neurology

## 2024-11-16 ENCOUNTER — Other Ambulatory Visit (HOSPITAL_BASED_OUTPATIENT_CLINIC_OR_DEPARTMENT_OTHER): Payer: Self-pay | Admitting: Family

## 2024-11-16 DIAGNOSIS — I2510 Atherosclerotic heart disease of native coronary artery without angina pectoris: Secondary | ICD-10-CM

## 2024-11-16 DIAGNOSIS — G250 Essential tremor: Secondary | ICD-10-CM

## 2024-11-16 DIAGNOSIS — E7800 Pure hypercholesterolemia, unspecified: Secondary | ICD-10-CM

## 2024-11-16 DIAGNOSIS — I700 Atherosclerosis of aorta: Secondary | ICD-10-CM

## 2025-02-21 ENCOUNTER — Ambulatory Visit: Admitting: Nurse Practitioner

## 2025-08-29 ENCOUNTER — Encounter: Admitting: Nurse Practitioner

## 2025-08-29 ENCOUNTER — Ambulatory Visit
